# Patient Record
Sex: Male | Born: 1965 | Race: Black or African American | Hispanic: No | Smoking: Current every day smoker
Health system: Southern US, Community
[De-identification: ages and names within clinical notes are randomized; demographics above are authoritative.]

## PROBLEM LIST (undated history)

## (undated) DIAGNOSIS — R011 Cardiac murmur, unspecified: Secondary | ICD-10-CM

## (undated) DIAGNOSIS — T7840XA Allergy, unspecified, initial encounter: Secondary | ICD-10-CM

## (undated) DIAGNOSIS — N400 Enlarged prostate without lower urinary tract symptoms: Secondary | ICD-10-CM

## (undated) DIAGNOSIS — I350 Nonrheumatic aortic (valve) stenosis: Secondary | ICD-10-CM

## (undated) HISTORY — DX: Cardiac murmur, unspecified: R01.1

## (undated) HISTORY — DX: Nonrheumatic aortic (valve) stenosis: I35.0

## (undated) HISTORY — PX: WISDOM TOOTH EXTRACTION: SHX21

## (undated) HISTORY — PX: AMPUTATION FINGER: SHX6594

## (undated) HISTORY — DX: Allergy, unspecified, initial encounter: T78.40XA

---

## 1998-07-19 ENCOUNTER — Ambulatory Visit (HOSPITAL_BASED_OUTPATIENT_CLINIC_OR_DEPARTMENT_OTHER): Admission: RE | Admit: 1998-07-19 | Discharge: 1998-07-19 | Payer: Self-pay | Admitting: Orthopedic Surgery

## 2001-10-15 ENCOUNTER — Emergency Department (HOSPITAL_COMMUNITY): Admission: EM | Admit: 2001-10-15 | Discharge: 2001-10-15 | Payer: Self-pay | Admitting: Emergency Medicine

## 2013-10-27 ENCOUNTER — Telehealth: Payer: Self-pay | Admitting: Family Medicine

## 2013-10-27 NOTE — Telephone Encounter (Signed)
OPEN CHART BY MISTAKE WAS LOOKING FOR ANOTHER PATIENT.

## 2014-04-14 ENCOUNTER — Emergency Department (HOSPITAL_COMMUNITY): Payer: 59

## 2014-04-14 ENCOUNTER — Emergency Department (HOSPITAL_COMMUNITY)
Admission: EM | Admit: 2014-04-14 | Discharge: 2014-04-14 | Disposition: A | Discharge status: Home / Self Care | Payer: 59 | Attending: Emergency Medicine | Admitting: Emergency Medicine

## 2014-04-14 ENCOUNTER — Encounter (HOSPITAL_COMMUNITY): Payer: Self-pay | Admitting: *Deleted

## 2014-04-14 DIAGNOSIS — B349 Viral infection, unspecified: Secondary | ICD-10-CM | POA: Diagnosis not present

## 2014-04-14 DIAGNOSIS — R05 Cough: Secondary | ICD-10-CM | POA: Diagnosis present

## 2014-04-14 DIAGNOSIS — Z87448 Personal history of other diseases of urinary system: Secondary | ICD-10-CM | POA: Insufficient documentation

## 2014-04-14 DIAGNOSIS — Z720 Tobacco use: Secondary | ICD-10-CM | POA: Diagnosis not present

## 2014-04-14 DIAGNOSIS — M791 Myalgia: Secondary | ICD-10-CM | POA: Insufficient documentation

## 2014-04-14 HISTORY — DX: Benign prostatic hyperplasia without lower urinary tract symptoms: N40.0

## 2014-04-14 MED ORDER — IBUPROFEN 800 MG PO TABS
800.0000 mg | ORAL_TABLET | Freq: Once | ORAL | Status: AC
  Administered 2014-04-14: 800 mg via ORAL
  Filled 2014-04-14: qty 1

## 2014-04-14 MED ORDER — HYDROCOD POLST-CHLORPHEN POLST 10-8 MG/5ML PO LQCR
5.0000 mL | Freq: Once | ORAL | Status: AC
  Administered 2014-04-14: 5 mL via ORAL
  Filled 2014-04-14: qty 5

## 2014-04-14 MED ORDER — HYDROCOD POLST-CHLORPHEN POLST 10-8 MG/5ML PO LQCR: 5.0000 mL | Freq: Two times a day (BID) | ORAL | Status: DC

## 2014-04-14 MED ORDER — IBUPROFEN 800 MG PO TABS: 800.0000 mg | ORAL_TABLET | Freq: Three times a day (TID) | ORAL | Status: DC

## 2014-04-14 NOTE — ED Provider Notes (Signed)
CSN: [id number]     Arrival date & time 04/14/14  1119 History   First MD Initiated Contact with Patient 04/14/14 1225     Chief Complaint  Patient presents with  . Cough     (Consider location/radiation/quality/duration/timing/severity/associated sxs/prior Treatment) HPI Comments: Body aches and fever. Generally not feeling well.  Patient is a 49 y.o. male presenting with cough. The history is provided by the patient.  Cough Cough characteristics:  Productive Sputum characteristics:  Manson PasseyBrown Severity:  Moderate Onset quality:  Gradual Duration:  1 day Timing:  Intermittent Progression:  Worsening Chronicity:  New Smoker: no   Context: sick contacts and weather changes   Relieved by:  Nothing Worsened by:  Nothing tried Ineffective treatments:  None tried Associated symptoms: myalgias and sinus congestion   Associated symptoms: no chest pain, no eye discharge, no shortness of breath and no wheezing   Risk factors: no recent travel     Past Medical History  Diagnosis Date  . Enlarged prostate    History reviewed. No pertinent past surgical history. History reviewed. No pertinent family history. History  Substance Use Topics  . Smoking status: Current Every Day Smoker  . Smokeless tobacco: Not on file  . Alcohol Use: Yes    Review of Systems  Constitutional: Negative for activity change.       All ROS Neg except as noted in HPI  HENT: Positive for congestion. Negative for nosebleeds.   Eyes: Negative for photophobia and discharge.  Respiratory: Positive for cough. Negative for shortness of breath and wheezing.   Cardiovascular: Negative for chest pain and palpitations.  Gastrointestinal: Negative for abdominal pain and blood in stool.  Genitourinary: Negative for dysuria, frequency and hematuria.  Musculoskeletal: Positive for myalgias. Negative for back pain, arthralgias and neck pain.  Skin: Negative.   Neurological: Negative for dizziness, seizures and speech  difficulty.  Psychiatric/Behavioral: Negative for hallucinations and confusion.      Allergies  Other and Chocolate  Home Medications   Prior to Admission medications   Medication Sig Start Date End Date Taking? Authorizing Provider  Dextromethorphan HBr (COUGH RELIEF PO) Take 1 tablet by mouth once.   Yes Historical Provider, MD   BP 134/67 mmHg  Pulse 72  Temp(Src) 100.3 F (37.9 C) (Oral)  Resp 22  Ht 6\' 2"  (1.88 m)  Wt 168 lb (76.204 kg)  BMI 21.56 kg/m2  SpO2 100% Physical Exam  Constitutional: He is oriented to person, place, and time. He appears well-developed and well-nourished.  Non-toxic appearance.  HENT:  Head: Normocephalic.  Right Ear: Tympanic membrane and external ear normal.  Left Ear: Tympanic membrane and external ear normal.  Nasal congestion present  Eyes: EOM and lids are normal. Pupils are equal, round, and reactive to light.  Neck: Normal range of motion. Neck supple. Carotid bruit is not present.  Cardiovascular: Normal rate, regular rhythm, normal heart sounds, intact distal pulses and normal pulses.   Pulmonary/Chest: Breath sounds normal. No respiratory distress. He has no rales. He exhibits tenderness.  Abdominal: Soft. Bowel sounds are normal. There is no tenderness. There is no guarding.  Musculoskeletal: Normal range of motion.  Lymphadenopathy:       Head (right side): No submandibular adenopathy present.       Head (left side): No submandibular adenopathy present.    He has no cervical adenopathy.  Neurological: He is alert and oriented to person, place, and time. He has normal strength. No cranial nerve deficit or sensory deficit.  Skin: Skin is warm and dry.  Psychiatric: He has a normal mood and affect. His speech is normal.  Nursing note and vitals reviewed.   ED Course  Procedures (including critical care time) Labs Review Labs Reviewed - No data to display  Imaging Review Dg Chest 2 View  04/14/2014   CLINICAL DATA:   Productive cough for 1 day  EXAM: CHEST  2 VIEW  COMPARISON:  None.  FINDINGS: Lungs are clear. Heart size and pulmonary vascularity are normal. No adenopathy. No bone lesions.  IMPRESSION: No edema or consolidation.   Electronically Signed   By: Bretta Bang III M.D.   On: 04/14/2014 12:41     EKG Interpretation None      MDM  Chest xray is negative for pneumonia. Pulse ox 100% on room air. Pt non-toxic  Plan - Rx for ibuprofen and tussionex. Pt to increase fluids and rest.   Final diagnoses:  None    **I have reviewed nursing notes, vital signs, and all appropriate lab and imaging results for this patient.Kathie Dike, PA-C 04/16/14 1610  Donnetta Hutching, MD 04/23/14 [phone]

## 2014-04-14 NOTE — ED Notes (Signed)
Pt verbalized understanding of no driving and to use caution within 4 hours of taking pain meds due to meds cause drowsiness 

## 2014-04-14 NOTE — ED Notes (Signed)
Cough brown sputum  And blood. . Onset yesterday, body aches.  No NVD  Low back pain,    Headache

## 2014-04-14 NOTE — Discharge Instructions (Signed)
Please wash her hands frequently. Please use ibuprofen 3 times daily with food. May use Tussionex 2 times daily for cough and congestion. This medication may cause drowsiness, please use with caution. Please increase water, juices, Gatorade. Viral Infections A virus is a type of germ. Viruses can cause:  Minor sore throats.  Aches and pains.  Headaches.  Runny nose.  Rashes.  Watery eyes.  Tiredness.  Coughs.  Loss of appetite.  Feeling sick to your stomach (nausea).  Throwing up (vomiting).  Watery poop (diarrhea). HOME CARE   Only take medicines as told by your doctor.  Drink enough water and fluids to keep your pee (urine) clear or pale yellow. Sports drinks are a good choice.  Get plenty of rest and eat healthy. Soups and broths with crackers or rice are fine. GET HELP RIGHT AWAY IF:   You have a very bad headache.  You have shortness of breath.  You have chest pain or neck pain.  You have an unusual rash.  You cannot stop throwing up.  You have watery poop that does not stop.  You cannot keep fluids down.  You or your child has a temperature by mouth above 102 F (38.9 C), not controlled by medicine.  Your baby is older than 3 months with a rectal temperature of 102 F (38.9 C) or higher.  Your baby is 283 months old or younger with a rectal temperature of 100.4 F (38 C) or higher. MAKE SURE YOU:   Understand these instructions.  Will watch this condition.  Will get help right away if you are not doing well or get worse. Document Released: 01/11/2008 Document Revised: 04/22/2011 Document Reviewed: 06/05/2010 [facility] Patient Information 2015 [employer], [place]. This information is not intended to replace advice given to you by your health care provider. Make sure you discuss any questions you have with your health care provider.  Cough, Adult  A cough is a reflex. It helps you clear your throat and airways. A cough can help heal your body. A cough  can last 2 or 3 weeks (acute) or may last more than 8 weeks (chronic). Some common causes of a cough can include an infection, allergy, or a cold. HOME CARE  Only take medicine as told by your doctor.  If given, take your medicines (antibiotics) as told. Finish them even if you start to feel better.  Use a cold steam vaporizer or humidifier in your home. This can help loosen thick spit (secretions).  Sleep so you are almost sitting up (semi-upright). Use pillows to do this. This helps reduce coughing.  Rest as needed.  Stop smoking if you smoke. GET HELP RIGHT AWAY IF:  You have yellowish-white fluid (pus) in your thick spit.  Your cough gets worse.  Your medicine does not reduce coughing, and you are losing sleep.  You cough up blood.  You have trouble breathing.  Your pain gets worse and medicine does not help.  You have a fever. MAKE SURE YOU:   Understand these instructions.  Will watch your condition.  Will get help right away if you are not doing well or get worse. Document Released: 10/11/2010 Document Revised: 06/14/2013 Document Reviewed: 10/11/2010 [facility] Patient Information 2015 [employer], [place]. This information is not intended to replace advice given to you by your health care provider. Make sure you discuss any questions you have with your health care provider.

## 2016-04-09 ENCOUNTER — Emergency Department (HOSPITAL_COMMUNITY): Payer: Self-pay

## 2016-04-09 ENCOUNTER — Emergency Department (HOSPITAL_COMMUNITY)
Admission: EM | Admit: 2016-04-09 | Discharge: 2016-04-09 | Disposition: A | Discharge status: Home / Self Care | Payer: Self-pay | Attending: Emergency Medicine | Admitting: Emergency Medicine

## 2016-04-09 ENCOUNTER — Encounter (HOSPITAL_COMMUNITY): Payer: Self-pay | Admitting: *Deleted

## 2016-04-09 DIAGNOSIS — J111 Influenza due to unidentified influenza virus with other respiratory manifestations: Secondary | ICD-10-CM

## 2016-04-09 DIAGNOSIS — R69 Illness, unspecified: Secondary | ICD-10-CM

## 2016-04-09 DIAGNOSIS — F17200 Nicotine dependence, unspecified, uncomplicated: Secondary | ICD-10-CM | POA: Insufficient documentation

## 2016-04-09 DIAGNOSIS — Z791 Long term (current) use of non-steroidal anti-inflammatories (NSAID): Secondary | ICD-10-CM | POA: Insufficient documentation

## 2016-04-09 DIAGNOSIS — J40 Bronchitis, not specified as acute or chronic: Secondary | ICD-10-CM | POA: Insufficient documentation

## 2016-04-09 MED ORDER — OSELTAMIVIR PHOSPHATE 75 MG PO CAPS: 75.0000 mg | ORAL_CAPSULE | Freq: Two times a day (BID) | ORAL | 0 refills | Status: DC

## 2016-04-09 MED ORDER — IBUPROFEN 400 MG PO TABS
400.0000 mg | ORAL_TABLET | Freq: Once | ORAL | Status: AC
  Administered 2016-04-09: 400 mg via ORAL
  Filled 2016-04-09: qty 1

## 2016-04-09 MED ORDER — DOXYCYCLINE HYCLATE 100 MG PO TABS: 100.0000 mg | ORAL_TABLET | Freq: Two times a day (BID) | ORAL | 0 refills | Status: DC

## 2016-04-09 MED ORDER — ACETAMINOPHEN 325 MG PO TABS
650.0000 mg | ORAL_TABLET | Freq: Once | ORAL | Status: AC
  Administered 2016-04-09: 650 mg via ORAL
  Filled 2016-04-09: qty 2

## 2016-04-09 MED ORDER — BENZONATATE 100 MG PO CAPS: 100.0000 mg | ORAL_CAPSULE | Freq: Three times a day (TID) | ORAL | 0 refills | Status: DC | PRN

## 2016-04-09 NOTE — Discharge Instructions (Signed)
Take over the counter decongestant (such as sudafed), as directed on packaging, for the next week.  Use over the counter normal saline nasal spray, with frequent nose blowing, several times per day for the next 2 weeks. Take over the counter tylenol and ibuprofen, as directed on packaging, as needed for discomfort or fever. Call your regular medical doctor today to schedule a follow up appointment this week.  Return to the Emergency Department immediately if worsening.

## 2016-04-09 NOTE — ED Provider Notes (Signed)
[facility]-EMER[facility]ENCY DEPT Provider Note   CSN: [id number] Arrival date & time: 04/09/16  0945     History   Chief Complaint Chief Complaint  Patient presents with  . Cough    HPI Isaac Diaz is a 51 y.o. male.  HPI Pt was seen at 1035.  Per pt, c/o gradual onset and persistence of constant runny/stuffy nose, sinus congestion, cough, "chills," and generalized body aches/fatigue for the past 2 days.  Denies rash, no CP/SOB, no N/V/D, no abd pain.    Past Medical History:  Diagnosis Date  . Enlarged prostate     There are no active problems to display for this patient.   History reviewed. No pertinent surgical history.     Home Medications    Prior to Admission medications   Medication Sig Start Date End Date Taking? Authorizing Provider  ibuprofen (ADVIL,MOTRIN) 800 MG tablet Take 1 tablet (800 mg total) by mouth 3 (three) times daily. 04/14/14  Yes Ivery Quale, PA-C  Pseudoephedrine-APAP-DM (DAYQUIL PO) Take 2 tablets by mouth every 12 (twelve) hours.   Yes Historical Provider, MD    Family History No family history on file.  Social History Social History  Substance Use Topics  . Smoking status: Current Every Day Smoker  . Smokeless tobacco: Never Used  . Alcohol use Yes     Allergies   Other and Chocolate   Review of Systems Review of Systems ROS: Statement: All systems negative except as marked or noted in the HPI; Constitutional: +generalized body aches/fatigue, chills. ; ; Eyes: Negative for eye pain, redness and discharge. ; ; ENMT: Negative for ear pain, hoarseness, sore throat. +nasal congestion, sinus pressure and rhinorrhea. ; ; Cardiovascular: Negative for chest pain, palpitations, diaphoresis, dyspnea and peripheral edema. ; ; Respiratory: +cough. Negative for wheezing and stridor. ; ; Gastrointestinal: Negative for nausea, vomiting, diarrhea, abdominal pain, blood in stool, hematemesis, jaundice and rectal bleeding. . ; ; Genitourinary: Negative  for dysuria, flank pain and hematuria. ; ; Musculoskeletal: Negative for back pain and neck pain. Negative for swelling and trauma.; ; Skin: Negative for pruritus, rash, abrasions, blisters, bruising and skin lesion.; ; Neuro: Negative for headache, lightheadedness and neck stiffness. Negative for weakness, altered level of consciousness, altered mental status, extremity weakness, paresthesias, involuntary movement, seizure and syncope.       Physical Exam Updated Vital Signs BP 137/75 (BP Location: Left Arm)   Pulse (!) 59   Temp 98.2 F (36.8 C) (Oral)   Resp 16   Ht 6\' 2"  (1.88 m)   Wt 175 lb (79.4 kg)   SpO2 100%   BMI 22.47 kg/m   Physical Exam 1040: Physical examination:  Nursing notes reviewed; Vital signs and O2 SAT reviewed;  Constitutional: Well developed, Well nourished, Well hydrated, In no acute distress; Head:  Normocephalic, atraumatic; Eyes: EOMI, PERRL, No scleral icterus; ENMT: TM's clear bilat. +edemetous nasal turbinates bilat with clear rhinorrhea. Mouth and pharynx without lesions. No tonsillar exudates. No intra-oral edema. No submandibular or sublingual edema. No hoarse voice, no drooling, no stridor. No pain with manipulation of larynx. No trismus. Mouth and pharynx normal, Mucous membranes moist; Neck: Supple, Full range of motion, No lymphadenopathy; Cardiovascular: Regular rate and rhythm, No gallop; Respiratory: Breath sounds clear & equal bilaterally, No wheezes.  Speaking full sentences with ease, Normal respiratory effort/excursion; Chest: Nontender, Movement normal; Abdomen: Soft, Nontender, Nondistended, Normal bowel sounds; Genitourinary: No CVA tenderness; Extremities: Pulses normal, No tenderness, No edema, No calf edema or asymmetry.;  Neuro: AA&Ox3, Major CN grossly intact.  Speech clear. No gross focal motor or sensory deficits in extremities.; Skin: Color normal, Warm, Dry.   ED Treatments / Results  Labs (all labs ordered are listed, but only abnormal  results are displayed)   EKG  EKG Interpretation None       Radiology   Procedures Procedures (including critical care time)  Medications Ordered in ED Medications  ibuprofen (ADVIL,MOTRIN) tablet 400 mg (400 mg Oral Given 04/09/16 1129)  acetaminophen (TYLENOL) tablet 650 mg (650 mg Oral Given 04/09/16 1129)     Initial Impression / Assessment and Plan / ED Course  I have reviewed the triage vital signs and the nursing notes.  Pertinent labs & imaging results that were available during my care of the patient were reviewed by me and considered in my medical decision making (see chart for details).  MDM Reviewed: previous chart, nursing note and vitals Interpretation: x-ray    Dg Chest 2 View Result Date: 04/09/2016 CLINICAL DATA:  51 year old male with history of 2 days of cough and congestion. Production of yellow sputum. EXAM: CHEST  2 VIEW COMPARISON:  Chest x-ray 04/14/2014. FINDINGS: Mild diffuse peribronchial cuffing. Lung volumes are normal. No consolidative airspace disease. No pleural effusions. No pneumothorax. No pulmonary nodule or mass noted. Pulmonary vasculature and the cardiomediastinal silhouette are within normal limits. Atherosclerosis in the thoracic aorta. IMPRESSION: 1. Mild diffuse peribronchial cuffing, concerning for an acute bronchitis. 2. Aortic atherosclerosis. Electronically Signed   By: Trudie Reedaniel  Entrikin M.D.   On: 04/09/2016 11:25    1145:  XR as above. Considering pt is a current smoker; will tx with doxycycline and tamiflu. Dx and testing d/w pt and family.  Questions answered.  Verb understanding, agreeable to d/c home with outpt f/u.    Final Clinical Impressions(s) / ED Diagnoses   Final diagnoses:  None    New Prescriptions New Prescriptions   No medications on file     Samuel JesterKathleen Mozelle Remlinger, DO 04/14/16 1714

## 2016-04-09 NOTE — ED Triage Notes (Signed)
Pt comes in with cough and congestion starting 2 days ago. Pt states his is coughing up yellow sputum. NAD noted.

## 2016-05-17 ENCOUNTER — Emergency Department (HOSPITAL_COMMUNITY)
Admission: EM | Admit: 2016-05-17 | Discharge: 2016-05-17 | Disposition: A | Discharge status: Home / Self Care | Payer: Self-pay | Attending: Emergency Medicine | Admitting: Emergency Medicine

## 2016-05-17 ENCOUNTER — Encounter (HOSPITAL_COMMUNITY): Payer: Self-pay

## 2016-05-17 DIAGNOSIS — F17210 Nicotine dependence, cigarettes, uncomplicated: Secondary | ICD-10-CM | POA: Insufficient documentation

## 2016-05-17 DIAGNOSIS — S161XXA Strain of muscle, fascia and tendon at neck level, initial encounter: Secondary | ICD-10-CM | POA: Insufficient documentation

## 2016-05-17 DIAGNOSIS — Y990 Civilian activity done for income or pay: Secondary | ICD-10-CM | POA: Insufficient documentation

## 2016-05-17 DIAGNOSIS — F1290 Cannabis use, unspecified, uncomplicated: Secondary | ICD-10-CM | POA: Insufficient documentation

## 2016-05-17 DIAGNOSIS — Y9389 Activity, other specified: Secondary | ICD-10-CM | POA: Insufficient documentation

## 2016-05-17 DIAGNOSIS — X500XXA Overexertion from strenuous movement or load, initial encounter: Secondary | ICD-10-CM | POA: Insufficient documentation

## 2016-05-17 DIAGNOSIS — Z79899 Other long term (current) drug therapy: Secondary | ICD-10-CM | POA: Insufficient documentation

## 2016-05-17 DIAGNOSIS — Y929 Unspecified place or not applicable: Secondary | ICD-10-CM | POA: Insufficient documentation

## 2016-05-17 MED ORDER — CYCLOBENZAPRINE HCL 10 MG PO TABS: 10.0000 mg | ORAL_TABLET | Freq: Three times a day (TID) | ORAL | 0 refills | Status: DC | PRN

## 2016-05-17 MED ORDER — IBUPROFEN 800 MG PO TABS: 800.0000 mg | ORAL_TABLET | Freq: Three times a day (TID) | ORAL | 0 refills | Status: DC

## 2016-05-17 NOTE — Discharge Instructions (Signed)
Alternate ice and heat to neck and back. Follow-up with primary doctor or return to the ER for any worsening symptoms. Avoid excessive use or heavy lifting with the left arm for 1 week.

## 2016-05-17 NOTE — ED Triage Notes (Addendum)
Pt reports he was at work yesterday carrying a piece of rebarb along with 2 other people. rebarb weighed approx 500 lbs. One person dropped there end casing jerk like motion when fell to ground causing injury to  neck and radiating into left shoulder

## 2016-05-17 NOTE — ED Provider Notes (Signed)
[facility] Provider Note   CSN: [id number] Arrival date & time: 05/17/16  1406     History   Chief Complaint Chief Complaint  Patient presents with  . Neck Pain    HPI JADARIOUS DOBBINS is a 51 y.o. male.  HPI   ELIYOHU CLASS is a 51 y.o. male who presents to the Emergency Department complaining of Pain to the left neck and shoulder that began 1 day prior to arrival. He states that he was helping to other people move proximally 500 pounds of heavy metal when another person dropped their portion of the weight causing him to "jerk" his shoulder and neck. He complains of worsening pain this morning upon waking. Pain is associated with movement of his neck and left arm. Pain improves at rest. He reports taking a friend's "pain by mouth" this morning which provided some relief. He denies fall, numbness or weakness of the upper extremities, headaches, dizziness, visual changes, chest pain  Past Medical History:  Diagnosis Date  . Enlarged prostate     There are no active problems to display for this patient.   History reviewed. No pertinent surgical history.     Home Medications    Prior to Admission medications   Medication Sig Start Date End Date Taking? Authorizing Provider  benzonatate (TESSALON) 100 MG capsule Take 1 capsule (100 mg total) by mouth 3 (three) times daily as needed for cough. 04/09/16   Samuel Jester, DO  doxycycline (VIBRA-TABS) 100 MG tablet Take 1 tablet (100 mg total) by mouth 2 (two) times daily. 04/09/16   Samuel Jester, DO  ibuprofen (ADVIL,MOTRIN) 800 MG tablet Take 1 tablet (800 mg total) by mouth 3 (three) times daily. 04/14/14   Ivery Quale, PA-C  oseltamivir (TAMIFLU) 75 MG capsule Take 1 capsule (75 mg total) by mouth every 12 (twelve) hours. 04/09/16   Samuel Jester, DO  Pseudoephedrine-APAP-DM (DAYQUIL PO) Take 2 tablets by mouth every 12 (twelve) hours.    Historical Provider, MD    Family History No family history on  file.  Social History Social History  Substance Use Topics  . Smoking status: Current Every Day Smoker    Packs/day: 1.00    Types: Cigarettes  . Smokeless tobacco: Never Used  . Alcohol use Yes     Comment:  weekly     Allergies   Other and Chocolate   Review of Systems Review of Systems  Constitutional: Negative for chills and fever.  Gastrointestinal: Negative for nausea and vomiting.  Musculoskeletal: Positive for arthralgias (Left shoulder pain) and neck pain. Negative for joint swelling.  Skin: Negative for color change and wound.  Neurological: Negative for dizziness, facial asymmetry, weakness, numbness and headaches.  All other systems reviewed and are negative.    Physical Exam Updated Vital Signs BP (!) 141/75 (BP Location: Right Arm)   Pulse 71   Temp 98.3 F (36.8 C) (Temporal)   Resp 15   Ht  (1.88 m)   Wt 79.4 kg   SpO2 100%   BMI 22.47 kg/m   Physical Exam  Constitutional: He is oriented to person, place, and time. He appears well-nourished. No distress.  HENT:  Head: Normocephalic and atraumatic.  Mouth/Throat: Oropharynx is clear and moist.  Eyes: EOM are normal. Pupils are equal, round, and reactive to light.  Neck: Trachea normal, normal range of motion and phonation normal. Muscular tenderness present. No spinous process tenderness present. No neck rigidity. Normal range of motion present.  Cardiovascular: Normal rate, regular rhythm and intact distal pulses.   No murmur heard. Pulmonary/Chest: Effort normal and breath sounds normal. No respiratory distress. He exhibits no tenderness.  Musculoskeletal: Normal range of motion. He exhibits tenderness. He exhibits no edema.       Cervical back: He exhibits tenderness and spasm. He exhibits no bony tenderness.       Back:  Focal tenderness along the left SCM and trapezius muscles.  Grip strength is strong and symmetrical bilaterally  Neurological: He is alert and oriented to person,  place, and time. He has normal strength. No sensory deficit. GCS eye subscore is 4. GCS verbal subscore is 5. GCS motor subscore is 6.  CN II-XII grossly intact  Skin: Skin is warm. Capillary refill takes less than 2 seconds. No rash noted.  Nursing note and vitals reviewed.    ED Treatments / Results  Labs (all labs ordered are listed, but only abnormal results are displayed) Labs Reviewed - No data to display  EKG  EKG Interpretation None       Radiology No results found.  Procedures Procedures (including critical care time)  Medications Ordered in ED Medications - No data to display   Initial Impression / Assessment and Plan / ED Course  I have reviewed the triage vital signs and the nursing notes.  Pertinent labs & imaging results that were available during my care of the patient were reviewed by me and considered in my medical decision making (see chart for details).     Patient well-appearing. Vital signs are stable. He has focal tenderness along the muscles of the left neck and upper back.  Pain is likely musculoskeletal. No focal neurological deficits. Neurovascularly intact. Injury did not include a fall, I do not feel that imaging is indicated at this time. Patient agrees to symptomatic treatment with muscle relaxer and NSAID. Advised to apply ice. Return precautions discussed  Final Clinical Impressions(s) / ED Diagnoses   Final diagnoses:  Acute strain of neck muscle, initial encounter    New Prescriptions New Prescriptions   No medications on file     Pauline Aus, PA-C 05/17/16 1520    Eber Hong, MD 05/18/16 [phone]

## 2016-05-21 ENCOUNTER — Emergency Department (HOSPITAL_COMMUNITY)
Admission: EM | Admit: 2016-05-21 | Discharge: 2016-05-21 | Disposition: A | Discharge status: Home / Self Care | Payer: Self-pay | Attending: Emergency Medicine | Admitting: Emergency Medicine

## 2016-05-21 ENCOUNTER — Encounter (HOSPITAL_COMMUNITY): Payer: Self-pay | Admitting: Emergency Medicine

## 2016-05-21 DIAGNOSIS — S46812S Strain of other muscles, fascia and tendons at shoulder and upper arm level, left arm, sequela: Secondary | ICD-10-CM | POA: Insufficient documentation

## 2016-05-21 DIAGNOSIS — Z791 Long term (current) use of non-steroidal anti-inflammatories (NSAID): Secondary | ICD-10-CM | POA: Insufficient documentation

## 2016-05-21 DIAGNOSIS — X500XXS Overexertion from strenuous movement or load, sequela: Secondary | ICD-10-CM | POA: Insufficient documentation

## 2016-05-21 DIAGNOSIS — F17210 Nicotine dependence, cigarettes, uncomplicated: Secondary | ICD-10-CM | POA: Insufficient documentation

## 2016-05-21 DIAGNOSIS — Z79899 Other long term (current) drug therapy: Secondary | ICD-10-CM | POA: Insufficient documentation

## 2016-05-21 NOTE — ED Triage Notes (Signed)
Patient states he is here to be cleared to go back to work and needs a note. No other complaints.

## 2016-05-21 NOTE — ED Provider Notes (Signed)
[facility] DEPT Provider Note   CSN: [id number] Arrival date & time: 05/21/16  1907     History   Chief Complaint Chief Complaint  Patient presents with  . re eval    HPI Isaac Diaz is a 51 y.o. male.  Patient is a 51 year old male who presents to the emergency department for recheck of his neck and shoulder area for return to work.  The patient states that on April 6 he was helping to carry a heavy piece of equipment at his job. Someone drop there in and it caused a jerking sensation to the left shoulder and neck area. He was evaluated in the emergency department. He was treated with muscle relaxers and anti-inflammatory pain medication. The patient states he has been on light duty, and been able to fulfill the requirements of his job on light duty. He states that he occasionally has minor soreness but nothing that MP symptoms from doing his activities of daily living, or his work. He has not had any difficulty with movement of the neck, shoulder, or left upper extremity. He's not dropping any objects, or having any other issues to be reported.   The history is provided by the patient.    Past Medical History:  Diagnosis Date  . Enlarged prostate     There are no active problems to display for this patient.   History reviewed. No pertinent surgical history.     Home Medications    Prior to Admission medications   Medication Sig Start Date End Date Taking? Authorizing Provider  benzonatate (TESSALON) 100 MG capsule Take 1 capsule (100 mg total) by mouth 3 (three) times daily as needed for cough. 04/09/16   Samuel Jester, DO  cyclobenzaprine (FLEXERIL) 10 MG tablet Take 1 tablet (10 mg total) by mouth 3 (three) times daily as needed. 05/17/16   Tammy Triplett, PA-C  doxycycline (VIBRA-TABS) 100 MG tablet Take 1 tablet (100 mg total) by mouth 2 (two) times daily. 04/09/16   Samuel Jester, DO  ibuprofen (ADVIL,MOTRIN) 800 MG tablet Take 1 tablet (800 mg total)  by mouth 3 (three) times daily. 05/17/16   Tammy Triplett, PA-C  oseltamivir (TAMIFLU) 75 MG capsule Take 1 capsule (75 mg total) by mouth every 12 (twelve) hours. 04/09/16   Samuel Jester, DO  Pseudoephedrine-APAP-DM (DAYQUIL PO) Take 2 tablets by mouth every 12 (twelve) hours.    Historical Provider, MD    Family History No family history on file.  Social History Social History  Substance Use Topics  . Smoking status: Current Every Day Smoker    Packs/day: 1.00    Types: Cigarettes  . Smokeless tobacco: Never Used  . Alcohol use Yes     Comment:  weekly     Allergies   Other and Chocolate   Review of Systems Review of Systems  Constitutional: Negative for activity change.       All ROS Neg except as noted in HPI  HENT: Negative for nosebleeds.   Eyes: Negative for photophobia and discharge.  Respiratory: Negative for cough, shortness of breath and wheezing.   Cardiovascular: Negative for chest pain and palpitations.  Gastrointestinal: Negative for abdominal pain and blood in stool.  Genitourinary: Negative for dysuria, frequency and hematuria.  Musculoskeletal: Negative for arthralgias, back pain and neck pain.  Skin: Negative.   Neurological: Negative for dizziness, seizures and speech difficulty.  Psychiatric/Behavioral: Negative for confusion and hallucinations.     Physical Exam Updated Vital Signs BP 130/76   Pulse  74   Temp 98.2 F (36.8 C)   Resp 18   Ht  (1.88 m)   Wt 79.4 kg   SpO2 99%   BMI 22.47 kg/m   Physical Exam  Constitutional: He is oriented to person, place, and time. He appears well-developed and well-nourished.  Non-toxic appearance.  HENT:  Head: Normocephalic.  Right Ear: Tympanic membrane and external ear normal.  Left Ear: Tympanic membrane and external ear normal.  Eyes: EOM and lids are normal. Pupils are equal, round, and reactive to light.  Neck: Normal range of motion. Neck supple. Carotid bruit is not present.    Cardiovascular: Normal rate, regular rhythm, normal heart sounds, intact distal pulses and normal pulses.   Pulmonary/Chest: Breath sounds normal. No respiratory distress.  Abdominal: Soft. Bowel sounds are normal. There is no tenderness. There is no guarding.  Musculoskeletal: Normal range of motion.  There is no palpable step off of the cervical, thoracic, or lumbar spine. There is full range of motion of the neck. There is no paraspinal tenderness at the cervical area. There is no dislocation of the shoulder or the scapula. There is no swelling or hematoma involving the shoulder. Radial pulses 2+. The capillary refill is less than 2 seconds.  Lymphadenopathy:       Head (right side): No submandibular adenopathy present.       Head (left side): No submandibular adenopathy present.    He has no cervical adenopathy.  Neurological: He is alert and oriented to person, place, and time. He has normal strength. No cranial nerve deficit or sensory deficit.  There no motor or sensory deficits appreciated of the right or left upper extremity. Patient is able to push against resistance without problem. This no atrophy appreciated of the right or left upper extremity.  Skin: Skin is warm and dry.  Psychiatric: He has a normal mood and affect. His speech is normal.  Nursing note and vitals reviewed.    ED Treatments / Results  Labs (all labs ordered are listed, but only abnormal results are displayed) Labs Reviewed - No data to display  EKG  EKG Interpretation None       Radiology No results found.  Procedures Procedures (including critical care time)  Medications Ordered in ED Medications - No data to display   Initial Impression / Assessment and Plan / ED Course  I have reviewed the triage vital signs and the nursing notes.  Pertinent labs & imaging results that were available during my care of the patient were reviewed by me and considered in my medical decision making (see chart  for details).     *I have reviewed nursing notes, vital signs, and all appropriate lab and imaging results for this patient.**  Final Clinical Impressions(s) / ED Diagnoses MDM Patient sustained a cervical strain and strain/sprain of the left shoulder about 4 days ago. Patient was seen in the emergency department. He was treated with anti-inflammatory pain medication and muscle relaxers. The patient states that he has good range of motion of his neck and shoulder areas now. He has very minimal pain, and he feels he is able to return to work duty. No gross neurologic deficit appreciated on examination at this time. No deformity or other issue on musculoskeletal examination. Patient is given a note to return to work on April 11. The patient is advised to see the orthopedic specialist for additional evaluation if the problem should recur. Patient is in agreement with this plan.  Final diagnoses:  None    New Prescriptions New Prescriptions   No medications on file     Ivery Quale, PA-C 05/21/16 2016    Maia Plan, MD 05/22/16 1029

## 2016-05-21 NOTE — Discharge Instructions (Signed)
Your vital signs are within normal limits. Your nerve and neurologic function are well within normal limits. Musculoskeletal limitations appreciated on examination at this time. Please see Dr. Romeo Apple for orthopedic evaluation and management if your symptoms should return.

## 2016-06-21 ENCOUNTER — Encounter: Payer: Self-pay | Admitting: Family Medicine

## 2016-06-21 ENCOUNTER — Ambulatory Visit (INDEPENDENT_AMBULATORY_CARE_PROVIDER_SITE_OTHER): Payer: 59 | Admitting: Family Medicine

## 2016-06-21 ENCOUNTER — Ambulatory Visit (HOSPITAL_COMMUNITY)
Admission: RE | Admit: 2016-06-21 | Discharge: 2016-06-21 | Disposition: A | Discharge status: Home / Self Care | Payer: 59 | Source: Ambulatory Visit | Attending: Family Medicine | Admitting: Family Medicine

## 2016-06-21 VITALS — BP 124/66 | HR 68 | Temp 97.5°F | Resp 16 | Ht 74.0 in | Wt 181.1 lb

## 2016-06-21 DIAGNOSIS — Z720 Tobacco use: Secondary | ICD-10-CM

## 2016-06-21 DIAGNOSIS — N401 Enlarged prostate with lower urinary tract symptoms: Secondary | ICD-10-CM | POA: Insufficient documentation

## 2016-06-21 DIAGNOSIS — N138 Other obstructive and reflux uropathy: Secondary | ICD-10-CM | POA: Insufficient documentation

## 2016-06-21 DIAGNOSIS — R5383 Other fatigue: Secondary | ICD-10-CM | POA: Insufficient documentation

## 2016-06-21 DIAGNOSIS — R0600 Dyspnea, unspecified: Secondary | ICD-10-CM | POA: Insufficient documentation

## 2016-06-21 DIAGNOSIS — Z7689 Persons encountering health services in other specified circumstances: Secondary | ICD-10-CM | POA: Diagnosis not present

## 2016-06-21 DIAGNOSIS — I700 Atherosclerosis of aorta: Secondary | ICD-10-CM

## 2016-06-21 LAB — COMPLETE METABOLIC PANEL WITH GFR
ALT: 8 U/L — ABNORMAL LOW (ref 9–46)
AST: 14 U/L (ref 10–35)
Albumin: 4 g/dL (ref 3.6–5.1)
Alkaline Phosphatase: 56 U/L (ref 40–115)
BUN: 13 mg/dL (ref 7–25)
CHLORIDE: 109 mmol/L (ref 98–110)
CO2: 23 mmol/L (ref 20–31)
Calcium: 9.4 mg/dL (ref 8.6–10.3)
Creat: 1.11 mg/dL (ref 0.70–1.33)
GFR, Est African American: 88 mL/min (ref >=60)
GFR, Est Non African American: 76 mL/min (ref >=60)
GLUCOSE: 66 mg/dL (ref 65–99)
POTASSIUM: 4.5 mmol/L (ref 3.5–5.3)
Sodium: 140 mmol/L (ref 135–146)
Total Bilirubin: 0.6 mg/dL (ref 0.2–1.2)
Total Protein: 6.6 g/dL (ref 6.1–8.1)

## 2016-06-21 LAB — CBC
HCT: 41.5 % (ref 38.5–50.0)
Hemoglobin: 13.8 g/dL (ref 13.2–17.1)
MCH: 31.4 pg (ref 27.0–33.0)
MCHC: 33.3 g/dL (ref 32.0–36.0)
MCV: 94.5 fL (ref 80.0–100.0)
MPV: 12.8 fL — ABNORMAL HIGH (ref 7.5–12.5)
Platelets: 189 10*3/uL (ref 140–400)
RBC: 4.39 MIL/uL (ref 4.20–5.80)
RDW: 13.8 % (ref 11.0–15.0)
WBC: 5 10*3/uL (ref 3.8–10.8)

## 2016-06-21 LAB — LIPID PANEL
CHOL/HDL RATIO: 3.9 ratio (ref <5.0)
Cholesterol: 147 mg/dL (ref <200)
HDL: 38 mg/dL — AB (ref >40)
LDL CALC: 81 mg/dL (ref <100)
TRIGLYCERIDES: 138 mg/dL (ref <150)
VLDL: 28 mg/dL (ref <30)

## 2016-06-21 LAB — TSH: TSH: 0.37 mIU/L — ABNORMAL LOW (ref 0.40–4.50)

## 2016-06-21 NOTE — Progress Notes (Signed)
Chief Complaint  Patient presents with  . Establish Care   New to establish No PCP for many years No old records available Says tetanus and flu shots up to date No colon cancer screening Does not feel need for HIV or Hep C screening - offered and discussed  He had BPH diagnosis in his 49s.  It has bothered him intermittently over years, the last few being more severe and constant.  Trouble starting stream.  Frequency.  inability to empty bladder fully.  NO hematuria.  Sometimes urine is "dark"  I have discussed the multiple health risks associated with cigarette smoking including, but not limited to, cardiovascular disease, lung disease and cancer.  I have strongly recommended that smoking be stopped.  I have reviewed the various methods of quitting including cold [place], classes, nicotine replacements and prescription medications.  I have offered assistance in this difficult process.  The patient is not interested in assistance at this time.  He has a history of heart murmer.  No diagnostic testing.  No chest pain or heart symptoms.    More dyspnea lately.  More tired.  "saw stars" a couple of times with exertion or standing.    Patient Active Problem List   Diagnosis Date Noted  . 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 Tobacco abuse 06/21/2016  . Dyspnea 06/21/2016  . Fatigue 06/21/2016  . Aortic atherosclerosis (HCC) 06/21/2016    Outpatient Encounter Prescriptions as of 06/21/2016  Medication Sig  NONE No facility-administered encounter medications on file as of 06/21/2016.     Past Medical History:  Diagnosis Date  . Allergy   . Enlarged prostate   . Heart murmur     Past Surgical History:  Procedure Laterality Date  . AMPUTATION FINGER Right    fingertip right long    Social History   Social History  . Marital status: Legally Separated    Spouse name: N/A  . Number of children: 6  . Years of education: 94   Occupational History   . concrete [employer]    Social History Main Topics  . Smoking status: Current Every Day Smoker    Packs/day: 1.00    Years: 28.00    Types: Cigarettes    Start date: 02/12/1988  . Smokeless tobacco: Never Used  . Alcohol use Yes     Comment:  weekly  . Drug use: Yes    Types: Marijuana  . Sexual activity: Yes    Birth control/ protection: None   Other Topics Concern  . Not on file   Social History Narrative   Born in [place]   Worked in [place] for 17 years   Moved back Dec 2018   Lives with Sister, [employer]   Family nearby       Family History  Problem Relation Age of Onset  . Cancer Mother        stomach  . Stroke Father 13  . Aneurysm Sister        brain aneurysm  . Cancer Maternal Grandfather        lung    Review of Systems  Constitutional: Positive for malaise/fatigue. Negative for chills and fever.  HENT: Negative for congestion and hearing loss.   Eyes: Negative for blurred vision and pain.  Respiratory: Positive for shortness of breath. Negative for cough, sputum production and wheezing.   Cardiovascular: Negative for chest pain and leg swelling.  Gastrointestinal: Negative for abdominal pain, constipation, diarrhea and heartburn.  Genitourinary: Positive for frequency and urgency. Negative for dysuria, flank pain and hematuria.  Musculoskeletal: Negative for falls, joint pain and myalgias.  Neurological: Positive for dizziness. Negative for seizures and headaches.  Endo/Heme/Allergies: Negative for environmental allergies and polydipsia. Does not bruise/bleed easily.  Psychiatric/Behavioral: Negative for depression. The patient is not nervous/anxious and does not have insomnia.     BP 124/66 (BP Location: Right Arm, Patient Position: Sitting, Cuff Size: Large)   Pulse 68   Temp 97.5 F (36.4 C) (Temporal)   Resp 16   Ht 6\' 2"  (1.88 m)   Wt 181 lb 1.3 oz (82.1 kg)   SpO2 98%   BMI 23.25 kg/m   Physical Exam  Constitutional: He is  oriented to person, place, and time. He appears well-developed and well-nourished.  HENT:  Head: Normocephalic and atraumatic.  Right Ear: External ear normal.  Left Ear: External ear normal.  Mouth/Throat: Oropharynx is clear and moist.  Dental fractures  Eyes: Conjunctivae are normal. Pupils are equal, round, and reactive to light.  Neck: Normal range of motion. Neck supple. No thyromegaly present.  Cardiovascular: Normal rate and regular rhythm.   Prominent systolic murmur across precorsium  Pulmonary/Chest: Effort normal and breath sounds normal. No respiratory distress.  Abdominal: Soft. Bowel sounds are normal.  No organmegaly  Musculoskeletal: Normal range of motion. He exhibits no edema.  Lymphadenopathy:    He has no cervical adenopathy.  Neurological: He is alert and oriented to person, place, and time. He displays normal reflexes.  Gait normal  Skin: Skin is warm and dry.  Psychiatric: He has a normal mood and affect. His behavior is normal. Thought content normal.  Nursing note and vitals reviewed.  ASSESSMENT/PLAN:  1. BPH with obstruction/lower urinary tract symptoms - PSA - Ambulatory referral to Urology  2. Tobacco abuse  3. Dyspnea, unspecified type - DG Chest 2 View; Future  4. Fatigue, unspecified type - CBC - COMPLETE METABOLIC PANEL WITH GFR - Hemoglobin A1c - Lipid panel - VITAMIN D 25 Hydroxy (Vit-D Deficiency, Fractures) - TSH - Urinalysis, Routine w reflex microscopic  5. Encounter to establish care with new doctor  6. Aortic atherosclerosis (HCC) On x ray  6. Heart murmur - ECHOCARDIOGRAM COMPLETE; Future  Patient Instructions  Lab tests today Chest x ray today Drink more WATER Echocardiogram is ordered to check your heart Urology referral is made to check your prostate See me in a couple of weeks after testing    Eustace MooreYvonne Sue Britanni Yarde, MD

## 2016-06-21 NOTE — Patient Instructions (Signed)
Lab tests today Chest x ray today Drink more WATER Echocardiogram is ordered to check your heart Urology referral is made to check your prostate See me in a couple of weeks after testing

## 2016-06-22 LAB — URINALYSIS, ROUTINE W REFLEX MICROSCOPIC
BILIRUBIN URINE: NEGATIVE
GLUCOSE, UA: NEGATIVE
HGB URINE DIPSTICK: NEGATIVE
Ketones, ur: NEGATIVE
Leukocytes, UA: NEGATIVE
Nitrite: NEGATIVE
PROTEIN: NEGATIVE
Specific Gravity, Urine: 1.024 (ref 1.001–1.035)
pH: 6.5 (ref 5.0–8.0)

## 2016-06-22 LAB — PSA: PSA: 0.4 ng/mL (ref <=4.0)

## 2016-06-22 LAB — HEMOGLOBIN A1C
Hgb A1c MFr Bld: 5.4 % (ref <5.7)
Mean Plasma Glucose: 108 mg/dL

## 2016-06-22 LAB — VITAMIN D 25 HYDROXY (VIT D DEFICIENCY, FRACTURES): Vit D, 25-Hydroxy: 8 ng/mL — ABNORMAL LOW (ref 30–100)

## 2016-06-24 ENCOUNTER — Encounter: Payer: Self-pay | Admitting: Family Medicine

## 2016-06-24 MED ORDER — VITAMIN D (ERGOCALCIFEROL) 1.25 MG (50000 UNIT) PO CAPS
50000.0000 [IU] | ORAL_CAPSULE | ORAL | 0 refills | Status: DC
Start: 2016-06-24 — End: 2016-07-25

## 2016-06-28 ENCOUNTER — Ambulatory Visit (HOSPITAL_COMMUNITY)
Admission: RE | Admit: 2016-06-28 | Discharge: 2016-06-28 | Disposition: A | Discharge status: Home / Self Care | Payer: 59 | Source: Ambulatory Visit | Attending: Family Medicine | Admitting: Family Medicine

## 2016-06-28 DIAGNOSIS — Z7689 Persons encountering health services in other specified circumstances: Secondary | ICD-10-CM

## 2016-06-28 DIAGNOSIS — R5383 Other fatigue: Secondary | ICD-10-CM

## 2016-06-28 DIAGNOSIS — I340 Nonrheumatic mitral (valve) insufficiency: Secondary | ICD-10-CM | POA: Insufficient documentation

## 2016-06-28 DIAGNOSIS — Q231 Congenital insufficiency of aortic valve: Secondary | ICD-10-CM | POA: Diagnosis not present

## 2016-06-28 DIAGNOSIS — N138 Other obstructive and reflux uropathy: Secondary | ICD-10-CM | POA: Diagnosis not present

## 2016-06-28 DIAGNOSIS — R0600 Dyspnea, unspecified: Secondary | ICD-10-CM | POA: Diagnosis not present

## 2016-06-28 DIAGNOSIS — Z720 Tobacco use: Secondary | ICD-10-CM

## 2016-06-28 DIAGNOSIS — N401 Enlarged prostate with lower urinary tract symptoms: Secondary | ICD-10-CM | POA: Diagnosis not present

## 2016-06-28 LAB — ECHOCARDIOGRAM COMPLETE
AO mean calculated velocity dopler: 258 cm/s
AOPV: 0.41 m/s
AOVTI: 96.1 cm
AV Area mean vel: 1.33 cm2
AV Mean grad: 32 mmHg
AV Peak grad: 60 mmHg
AV VEL mean LVOT/AV: 0.42
AV pk vel: 388 cm/s
AV vel: 1.46
AVAREAMEANVIN: 0.64 cm2/m2
AVAREAVTI: 1.28 cm2
AVAREAVTIIND: 0.7 cm2/m2
AVPHT: 610 ms
CHL CUP AV PEAK INDEX: 0.62
CHL CUP MV DEC (S): 254
CHL CUP STROKE VOLUME: 126 mL
CHL CUP TV REG PEAK VELOCITY: 266 cm/s
E decel time: 254 msec
E/e' ratio: 7.79
FS: 38 % (ref 28–44)
IVS/LV PW RATIO, ED: 0.93
LA ID, A-P, ES: 38 mm
LA diam end sys: 38 mm
LA diam index: 1.83 cm/m2
LA vol A4C: 78.4 ml
LA vol index: 45.4 mL/m2
LA vol: 94.4 mL
LV E/e' medial: 7.79
LV PW d: 10.9 mm — AB (ref 0.6–1.1)
LV TDI E'MEDIAL: 10
LV e' LATERAL: 14 cm/s
LV sys vol: 53 mL
LVDIAVOL: 179 mL — AB (ref 62–150)
LVDIAVOLIN: 86 mL/m2
LVEEAVG: 7.79
LVOT VTI: 44.7 cm
LVOT area: 3.14 cm2
LVOT diameter: 20 mm
LVOT peak grad rest: 10 mmHg
LVOT peak vel: 158 cm/s
LVOTSV: 140 mL
LVOTVTI: 0.47 cm
LVSYSVOLIN: 26 mL/m2
Lateral S' vel: 13.3 cm/s
MV Peak grad: 5 mmHg
MV pk E vel: 109 m/s
MVPKAVEL: 44.1 m/s
RV TAPSE: 24.8 mm
RV sys press: 36 mmHg
Simpson's disk: 70
TDI e' lateral: 14
TR max vel: 266 cm/s
Valve area index: 0.7
Valve area: 1.46 cm2

## 2016-06-28 NOTE — Progress Notes (Signed)
*  PRELIMINARY RESULTS* Echocardiogram 2D Echocardiogram has been performed.  Isaac Diaz, Isaac Diaz 06/28/2016, 3:57 PM

## 2016-07-01 ENCOUNTER — Encounter: Payer: Self-pay | Admitting: Family Medicine

## 2016-07-01 DIAGNOSIS — Q230 Congenital stenosis of aortic valve: Secondary | ICD-10-CM

## 2016-07-01 DIAGNOSIS — Q231 Congenital insufficiency of aortic valve: Principal | ICD-10-CM

## 2016-07-10 ENCOUNTER — Telehealth: Payer: Self-pay | Admitting: Family Medicine

## 2016-07-10 NOTE — Telephone Encounter (Signed)
Patient is requesting dr.nelson to call him and go over his test results with him, he doesn't understand  Cb#: [phone]

## 2016-07-11 NOTE — Telephone Encounter (Signed)
Left message to call back  

## 2016-07-12 NOTE — Telephone Encounter (Signed)
Pt left message on nurse line 07/11/16 4:14pm. No detail.  Returning call.

## 2016-07-16 NOTE — Telephone Encounter (Signed)
Left message for Isaac Diaz to call office

## 2016-07-25 ENCOUNTER — Ambulatory Visit (INDEPENDENT_AMBULATORY_CARE_PROVIDER_SITE_OTHER): Payer: 59 | Admitting: Cardiology

## 2016-07-25 VITALS — BP 132/72 | HR 67 | Ht 74.5 in | Wt 179.0 lb

## 2016-07-25 DIAGNOSIS — I350 Nonrheumatic aortic (valve) stenosis: Secondary | ICD-10-CM | POA: Diagnosis not present

## 2016-07-25 DIAGNOSIS — Z8774 Personal history of (corrected) congenital malformations of heart and circulatory system: Secondary | ICD-10-CM | POA: Diagnosis not present

## 2016-07-25 DIAGNOSIS — I10 Essential (primary) hypertension: Secondary | ICD-10-CM | POA: Diagnosis not present

## 2016-07-25 NOTE — Progress Notes (Signed)
Clinical Summary Mr. Jim LikeMillner is a 51 y.o.male seen today as a new patient, he is referred by Dr Delton SeeNelson for heart mumrur   1. Aortic stenosis - echo 06/2016 LVEF 60-65%, normal diastolic function, moderate AS mean grad 32, AVA VTI 1.46, mod AI, mild MR - probable bicuspid AV - no SOB or DOE, + generalized  Fatigue, can feel weak all over     - SH: son with Tetrology of fallot with prior surgery   Past Medical History:  Diagnosis Date  . Allergy   . Enlarged prostate   . Heart murmur      Allergies  Allergen Reactions  . Other Anaphylaxis    Feathers (pillow stuffing)  . Chocolate Hives     Current Outpatient Prescriptions  Medication Sig Dispense Refill  . Vitamin D, Ergocalciferol, (DRISDOL) 50000 units CAPS capsule Take 1 capsule (50,000 Units total) by mouth every 7 (seven) days. 12 capsule 0   No current facility-administered medications for this visit.      Past Surgical History:  Procedure Laterality Date  . AMPUTATION FINGER Right    fingertip right long     Allergies  Allergen Reactions  . Other Anaphylaxis    Feathers (pillow stuffing)  . Chocolate Hives      Family History  Problem Relation Age of Onset  . Cancer Mother        stomach  . Stroke Father 53  . Aneurysm Sister        brain aneurysm  . Cancer Maternal Grandfather        lung     Social History Mr. Jim LikeMillner reports that he has been smoking Cigarettes.  He started smoking about 28 years ago. He has a 28.00 pack-year smoking history. He has never used smokeless tobacco. Mr. Jim LikeMillner reports that he drinks alcohol.   Review of Systems CONSTITUTIONAL: +fatigue HEENT: Eyes: No visual loss, blurred vision, double vision or yellow sclerae.No hearing loss, sneezing, congestion, runny nose or sore throat.  SKIN: No rash or itching.  CARDIOVASCULAR: per HPI RESPIRATORY: No shortness of breath, cough or sputum.  GASTROINTESTINAL: No anorexia, nausea, vomiting or diarrhea. No  abdominal pain or blood.  GENITOURINARY: No burning on urination, no polyuria NEUROLOGICAL: No headache, dizziness, syncope, paralysis, ataxia, numbness or tingling in the extremities. No change in bowel or bladder control.  MUSCULOSKELETAL: No muscle, back pain, joint pain or stiffness.  LYMPHATICS: No enlarged nodes. No history of splenectomy.  PSYCHIATRIC: No history of depression or anxiety.  ENDOCRINOLOGIC: No reports of sweating, cold or heat intolerance. No polyuria or polydipsia.  Marland Kitchen.   Physical Examination Vitals:   07/25/16 1342 07/25/16 1343  BP: 130/70 132/72  Pulse: 67    Vitals:   07/25/16 1342  Weight: 179 lb (81.2 kg)  Height: 6' 2.5" (1.892 m)    Gen: resting comfortably, no acute distress HEENT: no scleral icterus, pupils equal round and reactive, no palptable cervical adenopathy,  CV: RRR, 3/6 systolic murmur rusb, no jvd Resp: Clear to auscultation bilaterally GI: abdomen is soft, non-tender, non-distended, normal bowel sounds, no hepatosplenomegaly MSK: extremities are warm, no edema.  Skin: warm, no rash Neuro:  no focal deficits Psych: appropriate affect   Diagnostic Studies Study Conclusions  - Left ventricle: The cavity size was mildly dilated. Wall   thickness was increased in a pattern of mild LVH. Systolic   function was normal. The estimated ejection fraction was in the   range of 60% to 65%.  Wall motion was normal; there were no   regional wall motion abnormalities. Left ventricular diastolic   function parameters were normal. - Aortic valve: Functionally bicuspid; moderately calcified   leaflets. There was moderate stenosis. There was moderate   regurgitation. Mean gradient (S): 32 mm Hg. Peak gradient (S): 60   mm Hg. VTI ratio of LVOT to aortic valve: 0.47. Valve area (VTI):   1.46 cm^2. Valve area (Vmax): 1.28 cm^2. Valve area (Vmean): 1.33   cm^2. - Mitral valve: Mildly thickened leaflets . There was mild   regurgitation. - Left  atrium: The atrium was mildly dilated. - Right atrium: The atrium was mildly dilated. Central venous   pressure (est): 8 mm Hg. - Tricuspid valve: There was trivial regurgitation. - Pulmonary arteries: PA peak pressure: 36 mm Hg (S). - Pericardium, extracardiac: There was no pericardial effusion.  Impressions:  - Mild LVH with mild LV chamber dilatation and LVEF 60-65%. Grossly   normal diastolic function. Mild biatrial enlargement. Mildly   thickened mitral leaflets with mild mitral regurgitation.   Functionally bicuspid aortic valve with moderate calcification   and evidence of moderate aortic stenosis as well as moderate   aortic regurgitation. Trivial tricuspid regurgitation with upper   normal PASP 36 mmHg.    Assessment and Plan  1. Aortic stenosis - apparent bicuspid AV by echo, moderate AS with mean grad 32 and AVA 1.3 - continue to monitor valve at this time, he does not meet criteria for intervention - he denies any symptoms. He reports generalized fatigue, but no SOB/dyspnea. Overall symptoms are not cardiac - given his bicuspid AV will need CTA to evaluate entire aorta to look for coexisting aortopathy. - EKG in clinic shows SR, LVH    F/u 6 mionths   Antoine Poche, M.D.

## 2016-07-25 NOTE — Patient Instructions (Signed)
Medication Instructions:  Your physician recommends that you continue on your current medications as directed. Please refer to the Current Medication list given to you today.   Labwork: NONE  Testing/Procedures: Non-Cardiac CT Angiography (CTA), is a special type of CT scan that uses a computer to produce multi-dimensional views of major blood vessels throughout the body. In CT angiography, a contrast material is injected through an IV to help visualize the blood vessels   Follow-Up: Your physician wants you to follow-up in: 6 MONTHS.  You will receive a reminder letter in the mail two months in advance. If you don't receive a letter, please call our office to schedule the follow-up appointment.   Any Other Special Instructions Will Be Listed Below (If Applicable).     If you need a refill on your cardiac medications before your next appointment, please call your pharmacy.   

## 2016-07-26 ENCOUNTER — Telehealth: Payer: Self-pay

## 2016-07-26 ENCOUNTER — Other Ambulatory Visit: Payer: Self-pay

## 2016-07-26 DIAGNOSIS — Q231 Congenital insufficiency of aortic valve: Secondary | ICD-10-CM

## 2016-07-26 NOTE — Telephone Encounter (Signed)
[  07/26/2016 1:04 PM] Raechel AcheGoins, TerryRoma Kayser:  Isaac Diaz Wednesday 6/27 register @ 2:45 Radiology. Nothing but water 4 hrs prior. Please advise patient    LM telling pt his CT has been moved to 6/27 and to call me when he gets this message

## 2016-07-29 ENCOUNTER — Encounter: Payer: Self-pay | Admitting: Cardiology

## 2016-08-01 ENCOUNTER — Other Ambulatory Visit: Payer: 59

## 2016-08-01 ENCOUNTER — Other Ambulatory Visit: Payer: Self-pay | Admitting: *Deleted

## 2016-08-01 ENCOUNTER — Ambulatory Visit (INDEPENDENT_AMBULATORY_CARE_PROVIDER_SITE_OTHER)
Admission: RE | Admit: 2016-08-01 | Discharge: 2016-08-01 | Disposition: A | Discharge status: Home / Self Care | Payer: 59 | Source: Ambulatory Visit | Attending: Cardiology | Admitting: Cardiology

## 2016-08-01 DIAGNOSIS — Z8774 Personal history of (corrected) congenital malformations of heart and circulatory system: Secondary | ICD-10-CM

## 2016-08-01 DIAGNOSIS — I359 Nonrheumatic aortic valve disorder, unspecified: Secondary | ICD-10-CM

## 2016-08-01 MED ORDER — IOPAMIDOL (ISOVUE-370) INJECTION 76%
100.0000 mL | Freq: Once | INTRAVENOUS | Status: AC | PRN
  Administered 2016-08-01: 100 mL via INTRAVENOUS

## 2016-08-07 ENCOUNTER — Ambulatory Visit (HOSPITAL_COMMUNITY): Payer: 59

## 2016-10-02 ENCOUNTER — Ambulatory Visit: Payer: 59 | Admitting: Urology

## 2016-11-19 ENCOUNTER — Encounter (HOSPITAL_COMMUNITY): Payer: Self-pay | Admitting: Emergency Medicine

## 2016-11-19 ENCOUNTER — Emergency Department (HOSPITAL_COMMUNITY): Payer: 59

## 2016-11-19 ENCOUNTER — Emergency Department (HOSPITAL_COMMUNITY)
Admission: EM | Admit: 2016-11-19 | Discharge: 2016-11-19 | Disposition: A | Discharge status: Home / Self Care | Payer: 59 | Attending: Emergency Medicine | Admitting: Emergency Medicine

## 2016-11-19 DIAGNOSIS — R011 Cardiac murmur, unspecified: Secondary | ICD-10-CM | POA: Insufficient documentation

## 2016-11-19 DIAGNOSIS — F17210 Nicotine dependence, cigarettes, uncomplicated: Secondary | ICD-10-CM | POA: Diagnosis not present

## 2016-11-19 DIAGNOSIS — J189 Pneumonia, unspecified organism: Secondary | ICD-10-CM | POA: Diagnosis not present

## 2016-11-19 DIAGNOSIS — R05 Cough: Secondary | ICD-10-CM | POA: Diagnosis present

## 2016-11-19 LAB — CBC
HEMATOCRIT: 44.3 % (ref 39.0–52.0)
Hemoglobin: 15.3 g/dL (ref 13.0–17.0)
MCH: 32.8 pg (ref 26.0–34.0)
MCHC: 34.5 g/dL (ref 30.0–36.0)
MCV: 95.1 fL (ref 78.0–100.0)
PLATELETS: 174 10*3/uL (ref 150–400)
RBC: 4.66 MIL/uL (ref 4.22–5.81)
RDW: 12.8 % (ref 11.5–15.5)
WBC: 6.7 10*3/uL (ref 4.0–10.5)

## 2016-11-19 LAB — I-STAT TROPONIN, ED: Troponin i, poc: 0.03 ng/mL (ref 0.00–0.08)

## 2016-11-19 LAB — BASIC METABOLIC PANEL
Anion gap: 10 (ref 5–15)
BUN: 7 mg/dL (ref 6–20)
CHLORIDE: 102 mmol/L (ref 101–111)
CO2: 25 mmol/L (ref 22–32)
Calcium: 9.3 mg/dL (ref 8.9–10.3)
Creatinine, Ser: 0.86 mg/dL (ref 0.61–1.24)
Glucose, Bld: 79 mg/dL (ref 65–99)
POTASSIUM: 3.8 mmol/L (ref 3.5–5.1)
SODIUM: 137 mmol/L (ref 135–145)

## 2016-11-19 MED ORDER — IBUPROFEN 600 MG PO TABS: 600.0000 mg | ORAL_TABLET | Freq: Four times a day (QID) | ORAL | 0 refills | Status: DC | PRN

## 2016-11-19 MED ORDER — ALBUTEROL SULFATE HFA 108 (90 BASE) MCG/ACT IN AERS: 2.0000 | INHALATION_SPRAY | RESPIRATORY_TRACT | 3 refills | Status: DC | PRN

## 2016-11-19 MED ORDER — BENZONATATE 100 MG PO CAPS: 100.0000 mg | ORAL_CAPSULE | Freq: Three times a day (TID) | ORAL | 0 refills | Status: DC

## 2016-11-19 MED ORDER — IPRATROPIUM-ALBUTEROL 0.5-2.5 (3) MG/3ML IN SOLN
3.0000 mL | Freq: Once | RESPIRATORY_TRACT | Status: AC
  Administered 2016-11-19: 3 mL via RESPIRATORY_TRACT
  Filled 2016-11-19: qty 3

## 2016-11-19 MED ORDER — PREDNISONE 20 MG PO TABS
40.0000 mg | ORAL_TABLET | Freq: Once | ORAL | Status: AC
  Administered 2016-11-19: 40 mg via ORAL
  Filled 2016-11-19: qty 2

## 2016-11-19 NOTE — ED Triage Notes (Signed)
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 Cough started after that

## 2016-11-19 NOTE — ED Notes (Signed)
Reports not using 3 M mask at work on Thursday as required.  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  Productive cough, thick brown- black thick secretions coughed up, starting on Friday morning.  The cough has persisted and has been sent home by employer on 2 occasions.  C/o chest discomfort with cough.

## 2016-11-19 NOTE — ED Provider Notes (Addendum)
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 The next day he was short of breath and coughing up cement tinged phlegm. He then started to develop some chest pain with coughing. Feeling a little more short of breath. 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 He never has pain when doing this. There is no swelling of his legs.  On exam he has no abnormal lung sounds, normal heart sounds, no edema.  EKG interpreted, shows some LVH with repolarization abnormality, troponin is negative, doubt that this is coronary syndrome, more likely related to pneumonitis.  Patient informed of results, stable for discharge on ibuprofen.  Medical screening examination/treatment/procedure(s) were conducted as a shared visit with non-physician practitioner(s) and myself.  I personally evaluated the patient during the encounter.  Clinical Impression: .  Final diagnoses:  Pneumonitis   Tessalon Albuterol Motrin    EKG Interpretation  Date/Time:  Tuesday November 19 2016 16:48:28 EDT Ventricular Rate:  55 PR Interval:    QRS Duration: 103 QT Interval:  429 QTC Calculation: 411 R Axis:   73 Text Interpretation:  Sinus rhythm Short PR interval Left ventricular hypertrophy Anterolateral ST elevation, probable early repolarization No old tracing to compare Confirmed by Eber Hong ([id number]) on 11/19/2016 5:51:35 PM         Eber Hong, MD 11/19/16 1755    Eber Hong, MD 11/22/16 [id number]

## 2016-11-19 NOTE — Discharge Instructions (Signed)
Your tests are normal.  They do show an enlarged heart which was also seen on your ultrasound from May.  Ibuprofen for pain Albuterol for shortness of breath Tessalon for coughing  See your doctor in 3 days for recheck

## 2016-11-19 NOTE — ED Provider Notes (Signed)
[facility]-EMERGENCY DEPT Provider Note   CSN: [id number] Arrival date & time: 11/19/16  1534     History   Chief Complaint Chief Complaint  Patient presents with  . Cough    HPI Isaac Diaz is a 51 y.o. male with a history of aortic valve stenosis who presents today for evaluation of cough.  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  He says that Friday morning he developed a cough, has been coughing up thick brown mucus.  He reports he feels congested.  He was sent home by his employer twice due to coughing at work.  He denies fevers or chills.  He smokes about 1/2 a pack a day.   His cardiologist is Dr. Wyline Mood.  He reports that he has been having chest pain since Friday.  He says that at rest his pain is a 5/10 and when he breathes his pain increases to 10/10.  His chest pain feels sharp and radiates to his back.  It is made better by sitting still.    He has a remote history of asthma when he was in high school and had an inhaler then.    HPI  Past Medical History:  Diagnosis Date  . Allergy   . Enlarged prostate   . Heart murmur     Patient Active Problem List   Diagnosis Date Noted  . 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 Tobacco abuse 06/21/2016  . Dyspnea 06/21/2016  . Fatigue 06/21/2016  . Aortic atherosclerosis (HCC) 06/21/2016    Past Surgical History:  Procedure Laterality Date  . AMPUTATION FINGER Right    fingertip right long       Home Medications    Prior to Admission medications   Not on File    Family History Family History  Problem Relation Age of Onset  . Cancer Mother        stomach  . Stroke Father 2  . Aneurysm Sister        brain aneurysm  . Cancer Maternal Grandfather        lung    Social History Social History  Substance Use Topics  . Smoking status: Current Every Day Smoker    Packs/day: 1.00   Years: 28.00    Types: Cigarettes    Start date: 02/12/1988  . Smokeless tobacco: Never Used  . Alcohol use Yes     Comment:  weekly     Allergies   Other and Chocolate   Review of Systems Review of Systems  Constitutional: Negative for chills and fever.  HENT: Positive for congestion. Negative for drooling, ear discharge, ear pain, rhinorrhea, sinus pain, sore throat and voice change.   Eyes: Negative for pain and visual disturbance.  Respiratory: Positive for cough and chest tightness. Negative for shortness of breath.   Cardiovascular: Positive for chest pain. Negative for palpitations and leg swelling.  Gastrointestinal: Negative for abdominal pain, nausea and vomiting.  Genitourinary: Negative for dysuria and hematuria.  Musculoskeletal: Negative for arthralgias and back pain.  Skin: Negative for color change and rash.  Neurological: Negative for seizures, syncope and headaches.  All other systems reviewed and are negative.    Physical Exam Updated Vital Signs BP (!) 170/92 (BP Location: Right Arm)   Pulse 83   Temp 98.5 F (36.9 C) (Oral)   Resp 17   Ht 6'  3" (1.905 m)   Wt 83.9 kg (185 lb)   SpO2 100%   BMI 23.12 kg/m   Physical Exam  Constitutional: He appears well-developed and well-nourished.  HENT:  Head: Normocephalic and atraumatic.  Right Ear: External ear normal.  Left Ear: External ear normal.  Nose: Nose normal.  Mouth/Throat: Oropharynx is clear and moist. No oropharyngeal exudate.  Eyes: Conjunctivae are normal.  Neck: Normal range of motion. Neck supple.  Cardiovascular: Normal rate, regular rhythm, normal heart sounds and intact distal pulses.   No murmur heard. Palpation of his chest and back does not re-create or exacerbate his chest pain.   Pulmonary/Chest: Effort normal. No accessory muscle usage or stridor. No tachypnea. No respiratory distress. He has decreased breath sounds. He has no wheezes. He has no rhonchi. He has no rales.  No  wheezing but decreased breath sounds diffusely bilaterally.    Abdominal: Soft. He exhibits no distension. There is no tenderness.  Musculoskeletal: He exhibits no edema, tenderness or deformity.  Lymphadenopathy:    He has no cervical adenopathy.  Neurological: He is alert.  Skin: Skin is warm and dry. He is not diaphoretic.  Psychiatric: He has a normal mood and affect.  Nursing note and vitals reviewed.    ED Treatments / Results  Labs (all labs ordered are listed, but only abnormal results are displayed) Labs Reviewed - No data to display  EKG  EKG Interpretation None       Radiology Dg Chest 2 View  Result Date: 11/19/2016 CLINICAL DATA:  Productive cough and congestion that started Friday. Pain at base of both scapulas. Hurts to take in a deep breath, SOB. EXAM: CHEST  2 VIEW COMPARISON:  None. FINDINGS: The heart size and mediastinal contours are within normal limits. Both lungs are clear. The visualized skeletal structures are unremarkable. IMPRESSION: No active cardiopulmonary disease. Electronically Signed   By: Elige Ko   On: 11/19/2016 16:12    Procedures Procedures (including critical care time)  Medications Ordered in ED Medications  ipratropium-albuterol (DUONEB) 0.5-2.5 (3) MG/3ML nebulizer solution 3 mL (not administered)  predniSONE (DELTASONE) tablet 40 mg (not administered)     Initial Impression / Assessment and Plan / ED Course  I have reviewed the triage vital signs and the nursing notes.  Pertinent labs & imaging results that were available during my care of the patient were reviewed by me and considered in my medical decision making (see chart for details).    Due to concern of chest pain that patient reports is 5/10 at rest I feel that patient needs more care than can be provided in the fast track setting of the ED.  I discussed the patient with Dr. Hyacinth Meeker who assumed care of the patient and agreed with moving him to the main ED.  Final  Clinical Impressions(s) / ED Diagnoses   Final diagnoses:  Pneumonitis    New Prescriptions New Prescriptions   No medications on file     Norman Clay 11/21/16 1124    Eber Hong, MD 11/22/16 [phone]

## 2017-04-21 ENCOUNTER — Encounter: Payer: Self-pay | Admitting: Family Medicine

## 2017-09-22 ENCOUNTER — Emergency Department (HOSPITAL_COMMUNITY): Payer: PRIVATE HEALTH INSURANCE

## 2017-09-22 ENCOUNTER — Encounter (HOSPITAL_COMMUNITY): Payer: Self-pay | Admitting: Emergency Medicine

## 2017-09-22 ENCOUNTER — Emergency Department (HOSPITAL_COMMUNITY)
Admission: EM | Admit: 2017-09-22 | Discharge: 2017-09-22 | Disposition: A | Discharge status: Home / Self Care | Payer: PRIVATE HEALTH INSURANCE | Attending: Emergency Medicine | Admitting: Emergency Medicine

## 2017-09-22 ENCOUNTER — Other Ambulatory Visit: Payer: Self-pay

## 2017-09-22 DIAGNOSIS — F17210 Nicotine dependence, cigarettes, uncomplicated: Secondary | ICD-10-CM | POA: Insufficient documentation

## 2017-09-22 DIAGNOSIS — J069 Acute upper respiratory infection, unspecified: Secondary | ICD-10-CM | POA: Diagnosis not present

## 2017-09-22 DIAGNOSIS — R0602 Shortness of breath: Secondary | ICD-10-CM | POA: Diagnosis present

## 2017-09-22 MED ORDER — ALBUTEROL SULFATE HFA 108 (90 BASE) MCG/ACT IN AERS
2.0000 | INHALATION_SPRAY | RESPIRATORY_TRACT | Status: DC | PRN
  Administered 2017-09-22: 2 via RESPIRATORY_TRACT
  Filled 2017-09-22: qty 6.7

## 2017-09-22 MED ORDER — IPRATROPIUM-ALBUTEROL 0.5-2.5 (3) MG/3ML IN SOLN
3.0000 mL | Freq: Once | RESPIRATORY_TRACT | Status: AC
  Administered 2017-09-22: 3 mL via RESPIRATORY_TRACT
  Filled 2017-09-22: qty 3

## 2017-09-22 MED ORDER — AZITHROMYCIN 250 MG PO TABS: 250.0000 mg | ORAL_TABLET | Freq: Every day | ORAL | 0 refills | Status: DC

## 2017-09-22 MED ORDER — PREDNISONE 20 MG PO TABS: 40.0000 mg | ORAL_TABLET | Freq: Every day | ORAL | 0 refills | Status: DC

## 2017-09-22 NOTE — ED Triage Notes (Signed)
Patient complaining of cough and shortness of breath since Thursday. States he is coughing up brown sputum.

## 2017-09-22 NOTE — ED Provider Notes (Signed)
[facility] Provider Note   CSN: [id number] Arrival date & time: 09/22/17  1020     History   Chief Complaint Chief Complaint  Patient presents with  . Shortness of Breath    HPI Isaac Diaz is a 52 y.o. male.  HPI Patient presents with shortness of breath.  States began Thursday at work with today being Monday.  Has had cough with brown sputum production.  No fever.  States he thought if he took it easy over the weekend he would do better but still has not really improved.  Dull chest tightness.  History of asthma as a child but states he has not had that in years.  Patient is a cigarette smoker however.  No abdominal pain.  No swelling in his legs.  No known sick contacts. Past Medical History:  Diagnosis Date  . Allergy   . Enlarged prostate   . Heart murmur     Patient Active Problem List   Diagnosis Date Noted  . 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 Tobacco abuse 06/21/2016  . Dyspnea 06/21/2016  . Fatigue 06/21/2016  . Aortic atherosclerosis (HCC) 06/21/2016    Past Surgical History:  Procedure Laterality Date  . AMPUTATION FINGER Right    fingertip right long        Home Medications    Prior to Admission medications   Medication Sig Start Date End Date Taking? Authorizing Provider  azithromycin (ZITHROMAX) 250 MG tablet Take 1 tablet (250 mg total) by mouth daily. Take first 2 tablets together, then 1 every day until finished. 09/22/17   Benjiman CorePickering, Kaityln Kallstrom, MD  ibuprofen (ADVIL,MOTRIN) 600 MG tablet Take 1 tablet (600 mg total) by mouth every 6 (six) hours as needed. Patient taking differently: Take 200 mg by mouth every 6 (six) hours as needed.  11/19/16   Eber HongMiller, Brian, MD  predniSONE (DELTASONE) 20 MG tablet Take 2 tablets (40 mg total) by mouth daily. 09/22/17   Benjiman CorePickering, Lariah Fleer, MD    Family History Family History  Problem Relation Age of Onset  . Cancer Mother        stomach  . Stroke Father  2055  . Aneurysm Sister        brain aneurysm  . Cancer Maternal Grandfather        lung    Social History Social History   Tobacco Use  . Smoking status: Current Every Day Smoker    Packs/day: 1.00    Years: 28.00    Pack years: 28.00    Types: Cigarettes    Start date: 02/12/1988  . Smokeless tobacco: Never Used  Substance Use Topics  . Alcohol use: Yes    Comment:  weekly  . Drug use: Yes    Types: Marijuana    Comment: rare     Allergies   Other and Chocolate   Review of Systems Review of Systems  Constitutional: Negative for chills and fever.  Respiratory: Positive for cough and shortness of breath.   Cardiovascular: Negative for chest pain.  Gastrointestinal: Negative for abdominal pain.  Genitourinary: Negative for flank pain.  Musculoskeletal: Negative for back pain.  Skin: Negative for rash.  Neurological: Negative for weakness.  Hematological: Negative for adenopathy.  Psychiatric/Behavioral: Negative for confusion.     Physical Exam Updated Vital Signs BP (!) 143/81   Pulse (!) 54   Temp 98.1 F (36.7 C) (Oral)   Resp 20   Ht 6\' 2"  (1.88 m)  Wt 81.6 kg   SpO2 98%   BMI 23.11 kg/m   Physical Exam  Constitutional: He appears well-developed.  HENT:  Head: Atraumatic.  Eyes: EOM are normal.  Cardiovascular: Regular rhythm.  Pulmonary/Chest:  Frequent coughs with mild scattered wheezes without focal rales or rhonchi.  Abdominal: Soft.  Musculoskeletal:       Right lower leg: He exhibits no edema.       Left lower leg: He exhibits no edema.  Neurological: He is alert.  Skin: Skin is warm. Capillary refill takes less than 2 seconds.     ED Treatments / Results  Labs (all labs ordered are listed, but only abnormal results are displayed) Labs Reviewed - No data to display  EKG None  Radiology Dg Chest 2 View  Result Date: 09/22/2017 CLINICAL DATA:  Productive cough and shortness of breath for the past week. EXAM: CHEST - 2 VIEW  COMPARISON:  Chest x-ray dated November 19, 2016. FINDINGS: The heart size and mediastinal contours are within normal limits. Normal pulmonary vascularity. Atherosclerotic calcification of the aortic arch. Mild hyperinflation. No focal consolidation, pleural effusion, or pneumothorax. No acute osseous abnormality. IMPRESSION: 1.  No active cardiopulmonary disease. 2. Mild hyperinflation may reflect COPD. Electronically Signed   By: Obie DredgeWilliam T Derry M.D.   On: 09/22/2017 11:29    Procedures Procedures (including critical care time)  Medications Ordered in ED Medications  albuterol (PROVENTIL HFA;VENTOLIN HFA) 108 (90 Base) MCG/ACT inhaler 2 puff (has no administration in time range)  ipratropium-albuterol (DUONEB) 0.5-2.5 (3) MG/3ML nebulizer solution 3 mL (3 mLs Nebulization Given 09/22/17 1102)     Initial Impression / Assessment and Plan / ED Course  I have reviewed the triage vital signs and the nursing notes.  Pertinent labs & imaging results that were available during my care of the patient were reviewed by me and considered in my medical decision making (see chart for details).     Patient with shortness of breath.  Cough with brown sputum production.  Is a current smoker but no previous COPD diagnosis.  However x-ray shows possible COPD changes.  Not hypoxic.  Will discharge home but will add steroids due to the wheezing and azithromycin due to the possibility of COPD.  Discharge home but will need a PCP follow-up for the difficulty breathing peer  Final Clinical Impressions(s) / ED Diagnoses   Final diagnoses:  Upper respiratory tract infection, unspecified type    ED Discharge Orders         Ordered    predniSONE (DELTASONE) 20 MG tablet  Daily     09/22/17 1237    azithromycin (ZITHROMAX) 250 MG tablet  Daily     09/22/17 1237           Benjiman CorePickering, Awad Gladd, MD 09/22/17 1242

## 2017-09-22 NOTE — Discharge Instructions (Addendum)
Follow-up with a primary care doctor for further management of your difficulty breathing.

## 2017-09-22 NOTE — ED Notes (Signed)
Respiratory paged at this time for tx.  

## 2017-09-22 NOTE — ED Notes (Signed)
Respiratory paged at this time for inhaler.

## 2018-11-25 ENCOUNTER — Other Ambulatory Visit: Payer: Self-pay | Admitting: *Deleted

## 2018-11-25 DIAGNOSIS — Z20822 Contact with and (suspected) exposure to covid-19: Secondary | ICD-10-CM

## 2018-11-26 LAB — NOVEL CORONAVIRUS, NAA: SARS-CoV-2, NAA: NOT DETECTED

## 2018-11-27 ENCOUNTER — Telehealth: Payer: Self-pay | Admitting: General Practice

## 2018-11-27 NOTE — Telephone Encounter (Signed)
Negative COVID results given. Patient results "NOT Detected." Caller expressed understanding. ° °

## 2019-04-07 ENCOUNTER — Telehealth: Payer: Self-pay

## 2019-04-07 NOTE — Telephone Encounter (Signed)
Virtual Visit Pre-Appointment Phone Call  "(Name), I am calling you today to discuss your upcoming appointment. We are currently trying to limit exposure to the virus that causes COVID-19 by seeing patients at home rather than in the office."  1. "What is the BEST phone number to call the day of the visit?" - include this in appointment notes  2. "Do you have or have access to (through a family member/friend) a smartphone with video capability that we can use for your visit?" a. If yes - list this number in appt notes as "cell" (if different from BEST phone #) and list the appointment type as a VIDEO visit in appointment notes b. If no - list the appointment type as a PHONE visit in appointment notes  Confirm consent - "In the setting of the current Covid19 crisis, you are scheduled for a (phone or video) visit with your provider on (date) at (time).  Just as we do with many in-office visits, in order for you to participate in this visit, we must obtain consent.  If you'd like, I can send this to your mychart (if signed up) or email for you to review.  Otherwise, I can obtain your verbal consent now.  All virtual visits are billed to your insurance company just like a normal visit would be.  By agreeing to a virtual visit, we'd like you to understand that the technology does not allow for your provider to perform an examination, and thus may limit your provider's ability to fully assess your condition. If your provider identifies any concerns that need to be evaluated in person, we will make arrangements to do so.  Finally, though the technology is pretty good, we cannot assure that it will always work on either your or our end, and in the setting of a video visit, we may have to convert it to a phone-only visit.  In either situation, we cannot ensure that we have a secure connection.  Are you willing to proceed?" STAFF: Did the patient verbally acknowledge consent to telehealth visit? Document  YES/NO here:  3. Advise patient to be prepared - "Two hours prior to your appointment, go ahead and check your blood pressure, pulse, oxygen saturation, and your weight (if you have the equipment to check those) and write them all down. When your visit starts, your provider will ask you for this information. If you have an Apple Watch or Kardia device, please plan to have heart rate information ready on the day of your appointment. Please have a pen and paper handy nearby the day of the visit as well."  4. Give patient instructions for MyChart download to smartphone OR [email or web address] as below if video visit (depending on what platform provider is using)  5. Inform patient they will receive a phone call 15 minutes prior to their appointment time (may be from unknown caller ID) so they should be prepared to answer    TELEPHONE CALL NOTE  Isaac Diaz has been deemed a candidate for a follow-up tele-health visit to limit community exposure during the Covid-19 pandemic. I spoke with the patient via phone to ensure availability of phone/video source, confirm preferred email & phone number, and discuss instructions and expectations.  I reminded Isaac Diaz to be prepared with any vital sign and/or heart rhythm information that could potentially be obtained via home monitoring, at the time of his visit. I reminded Isaac Diaz to expect a phone call prior to his visit.  Gracy Bruins 04/07/2019 9:11 AM   INSTRUCTIONS FOR DOWNLOADING THE MYCHART APP TO SMARTPHONE  - The patient must first make sure to have activated MyChart and know their login information - If Apple, go to App Store and type in MyChart in the search bar and download the app. If Android, ask patient to go to [employer] and type in [place] in the search bar and download the app. The app is free but as with any other app downloads, their phone may require them to verify saved payment information or  Apple/Android password.  - The patient will need to then log into the app with their MyChart username and password, and select  as their healthcare provider to link the account. When it is time for your visit, go to the MyChart app, find appointments, and click Begin Video Visit. Be sure to Select Allow for your device to access the Microphone and Camera for your visit. You will then be connected, and your provider will be with you shortly.  **If they have any issues connecting, or need assistance please contact MyChart service desk [phone])**  **If using a computer, in order to ensure the best quality for their visit they will need to use either of the following Internet Browsers: [employer] or Google Chrome**  IF USING DOXIMITY or DOXY.ME - The patient will receive a link just prior to their visit by text.     FULL LENGTH CONSENT FOR TELE-HEALTH VISIT   I hereby voluntarily request, consent and authorize [facility] and its employed or contracted physicians, physician assistants, nurse practitioners or other licensed health care professionals (the Practitioner), to provide me with telemedicine health care services (the "Services") as deemed necessary by the treating Practitioner. I acknowledge and consent to receive the Services by the Practitioner via telemedicine. I understand that the telemedicine visit will involve communicating with the Practitioner through live audiovisual communication technology and the disclosure of certain medical information by electronic transmission. I acknowledge that I have been given the opportunity to request an in-person assessment or other available alternative prior to the telemedicine visit and am voluntarily participating in the telemedicine visit.  I understand that I have the right to withhold or withdraw my consent to the use of telemedicine in the course of my care at any time, without affecting my right to future care  or treatment, and that the Practitioner or I may terminate the telemedicine visit at any time. I understand that I have the right to inspect all information obtained and/or recorded in the course of the telemedicine visit and may receive copies of available information for a reasonable fee.  I understand that some of the potential risks of receiving the Services via telemedicine include:  Marland Kitchen Delay or interruption in medical evaluation due to technological equipment failure or disruption; . Information transmitted may not be sufficient (e.g. poor resolution of images) to allow for appropriate medical decision making by the Practitioner; and/or  . In rare instances, security protocols could fail, causing a breach of personal health information.  Furthermore, I acknowledge that it is my responsibility to provide information about my medical history, conditions and care that is complete and accurate to the best of my ability. I acknowledge that Practitioner's advice, recommendations, and/or decision may be based on factors not within their control, such as incomplete or inaccurate data provided by me or distortions of diagnostic images or specimens that may result from electronic transmissions. I understand that the practice  of medicine is not an Chief Strategy Officer and that Practitioner makes no warranties or guarantees regarding treatment outcomes. I acknowledge that I will receive a copy of this consent concurrently upon execution via email to the email address I last provided but may also request a printed copy by calling the office of [place].    I understand that my insurance will be billed for this visit.   I have read or had this consent read to me. . I understand the contents of this consent, which adequately explains the benefits and risks of the Services being provided via telemedicine.  . I have been provided ample opportunity to ask questions regarding this consent and the Services and have had  my questions answered to my satisfaction. . I give my informed consent for the services to be provided through the use of telemedicine in my medical care  By participating in this telemedicine visit I agree to the above.

## 2019-04-08 ENCOUNTER — Telehealth: Payer: PRIVATE HEALTH INSURANCE | Admitting: Physician Assistant

## 2019-04-08 NOTE — Progress Notes (Unsigned)
Virtual Visit via Telephone Note   This visit type was conducted due to national recommendations for restrictions regarding the COVID-19 Pandemic (e.g. social distancing) in an effort to limit this patient's exposure and mitigate transmission in our community.  Due to his co-morbid illnesses, this patient is at least at moderate risk for complications without adequate follow up.  This format is felt to be most appropriate for this patient at this time.  The patient did not have access to video technology/had technical difficulties with video requiring transitioning to audio format only (telephone).  All issues noted in this document were discussed and addressed.  No physical exam could be performed with this format.  Please refer to the patient's chart for his  consent to telehealth for [facility].  Evaluation Performed:  Follow-up visit  This visit type was conducted due to national recommendations for restrictions regarding the COVID-19 Pandemic (e.g. social distancing).  This format is felt to be most appropriate for this patient at this time.  All issues noted in this document were discussed and addressed.  No physical exam was performed (except for noted visual exam findings with Video Visits).  Please refer to the patient's chart (MyChart message for video visits and phone note for telephone visits) for the patient's consent to telehealth for [facility]  Date:  04/08/2019   ID:  Isaac Diaz, DOB 1965/06/30, MRN [id number]  Patient Location:  ***  Provider location:   office  PCP:  Patient, No Pcp Per  Cardiologist:  Dina Rich, MD 07/25/2016 Electrophysiologist:  None   Chief Complaint:  ***  History of Present Illness:    Isaac Diaz is a 54 y.o. male who presents via audio/video conferencing for a telehealth visit today.    54 y.o. yo male who has a hx of aortic stenosis, probable bicuspid valve, BPH, tob use, possible COPD  ***   The  patient {does/does not:200015} have symptoms concerning for COVID-19 infection (fever, chills, cough, or new shortness of breath).    Prior CV studies:   The following studies were reviewed today:  ECHO: 06/28/2016 - Left ventricle: The cavity size was mildly dilated. Wall  thickness was increased in a pattern of mild LVH. Systolic  function was normal. The estimated ejection fraction was in the  range of 60% to 65%. Wall motion was normal; there were no  regional wall motion abnormalities. Left ventricular diastolic  function parameters were normal.  - Aortic valve: Functionally bicuspid; moderately calcified  leaflets. There was moderate stenosis. There was moderate  regurgitation. Mean gradient (S): 32 mm Hg. Peak gradient (S): 60  mm Hg. VTI ratio of LVOT to aortic valve: 0.47. Valve area (VTI):  1.46 cm^2. Valve area (Vmax): 1.28 cm^2. Valve area (Vmean): 1.33  cm^2.  - Mitral valve: Mildly thickened leaflets . There was mild  regurgitation.  - Left atrium: The atrium was mildly dilated.  - Right atrium: The atrium was mildly dilated. Central venous  pressure (est): 8 mm Hg.  - Tricuspid valve: There was trivial regurgitation.  - Pulmonary arteries: PA peak pressure: 36 mm Hg (S).  - Pericardium, extracardiac: There was no pericardial effusion.   Past Medical History:  Diagnosis Date  . Allergy   . Enlarged prostate   . Heart murmur    Past Surgical History:  Procedure Laterality Date  . AMPUTATION FINGER Right    fingertip right long     No outpatient medications have  been marked as taking for the 04/08/19 encounter (Appointment) with Carole Deere, Evelene Croon, PA-C.     Allergies:   Other and Chocolate   Social History   Tobacco Use  . Smoking status: Current Every Day Smoker    Packs/day: 1.00    Years: 28.00    Pack years: 28.00    Types: Cigarettes    Start date: 02/12/1988  . Smokeless tobacco: Never Used  Substance Use Topics  . Alcohol  use: Yes    Comment:  weekly  . Drug use: Yes    Types: Marijuana    Comment: rare     Family Hx: The patient's family history includes Aneurysm in his sister; Cancer in his maternal grandfather and mother; Stroke (age of onset: 78) in his father.  ROS:   Please see the history of present illness.    All other systems reviewed and are negative.   Labs/Other Tests and Data Reviewed:    Recent Labs: No results found for requested labs within last 8760 hours.   CBC    Component Value Date/Time   WBC 6.7 11/19/2016 1650   RBC 4.66 11/19/2016 1650   HGB 15.3 11/19/2016 1650   HCT 44.3 11/19/2016 1650   PLT 174 11/19/2016 1650   MCV 95.1 11/19/2016 1650   MCH 32.8 11/19/2016 1650   MCHC 34.5 11/19/2016 1650   RDW 12.8 11/19/2016 1650    CMP Latest Ref Rng & Units 11/19/2016 06/21/2016  Glucose 65 - 99 mg/dL 79 66  BUN 6 - 20 mg/dL 7 13  Creatinine 0.61 - 1.24 mg/dL 0.86 1.11  Sodium 135 - 145 mmol/L 137 140  Potassium 3.5 - 5.1 mmol/L 3.8 4.5  Chloride 101 - 111 mmol/L 102 109  CO2 22 - 32 mmol/L 25 23  Calcium 8.9 - 10.3 mg/dL 9.3 9.4  Total Protein 6.1 - 8.1 g/dL - 6.6  Total Bilirubin 0.2 - 1.2 mg/dL - 0.6  Alkaline Phos 40 - 115 U/L - 56  AST 10 - 35 U/L - 14  ALT 9 - 46 U/L - 8(L)     Recent Lipid Panel Lab Results  Component Value Date/Time   CHOL 147 06/21/2016 10:46 AM   TRIG 138 06/21/2016 10:46 AM   HDL 38 (L) 06/21/2016 10:46 AM   CHOLHDL 3.9 06/21/2016 10:46 AM   LDLCALC 81 06/21/2016 10:46 AM    Wt Readings from Last 3 Encounters:  09/22/17 180 lb (81.6 kg)  11/19/16 185 lb (83.9 kg)  07/25/16 179 lb (81.2 kg)     Objective:    Vital Signs:  There were no vitals taken for this visit.   54 y.o. male in no*** acute distress.   ASSESSMENT & PLAN:    1.  ***  COVID-19 Education: The signs and symptoms of COVID-19 were discussed with the patient and how to seek care for testing (follow up with PCP or arrange E-visit).  The importance of  social distancing was discussed today.  Patient Risk:   After full review of this patient's clinical status, I feel that they are at least moderate risk at this time.  Time:   Today, I have spent *** minutes with the patient with telehealth technology discussing ***.     Medication Adjustments/Labs and Tests Ordered: Current medicines are reviewed at length with the patient today.  Concerns regarding medicines are outlined above.  Tests Ordered: No orders of the defined types were placed in this encounter.  Medication Changes: No orders of  the defined types were placed in this encounter.   Disposition:  Follow up with Dina Rich, MD   Signed, Theodore Demark, PA-C  04/08/2019 12:49 PM    [facility]

## 2019-04-09 ENCOUNTER — Telehealth: Payer: Self-pay

## 2019-04-09 NOTE — Telephone Encounter (Signed)

## 2019-04-15 NOTE — Progress Notes (Addendum)
Virtual Visit via Telephone Note   This visit type was conducted due to national recommendations for restrictions regarding the COVID-19 Pandemic (e.g. social distancing) in an effort to limit this patient's exposure and mitigate transmission in our community.  Due to his co-morbid illnesses, this patient is at least at moderate risk for complications without adequate follow up.  This format is felt to be most appropriate for this patient at this time.  The patient did not have access to video technology/had technical difficulties with video requiring transitioning to audio format only (telephone).  All issues noted in this document were discussed and addressed.  No physical exam could be performed with this format.  Please refer to the patient's chart for his  consent to telehealth for [facility].   Date:  04/16/2019   ID:  Lacie Scotts, DOB 07-15-1965, MRN [id number]  Patient Location: Home Provider Location: Office  PCP:  Patient, No Pcp Per  Cardiologist:  Carlyle Dolly, MD  Electrophysiologist:  None   Evaluation Performed:  Follow-Up Visit  Chief Complaint: No complaints; Overdue Visit  History of Present Illness:    Isaac Diaz is a 54 y.o. male with past medical history of aortic stenosis (moderate by prior echocardiogram in 2018) and tobacco use who presents for an overdue follow-up telehealth visit.   He was last examined by Dr. Harl Bowie in 07/2016 and denied any chest pain or dyspnea on exertion at that time. Reported generalized fatigue which was felt to not be cardiac in etiology. A CTA was recommended to evaluate for any co-existing aortic abnormalities and this was performed in 07/2016 and showed no evidence of aneurysm or dissection.   In talking with the patient today, he reports being cautious given the pandemic and wears a mask when out in public. He did travel to [facility] to visit his son several times last year and had to be tested multiple times for COVID-19  upon returning home but always tested negative.  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 He stays active on the weekends and plays basketball at local parks. He reports playing full court and denies any chest pain or significant dyspnea when running. Says he "feels like he is still 19". Denies any lightheadedness, dizziness or presyncope. No recent orthopnea, PND or lower extremity edema.  He does continue to smoke approximately 0.75 ppd.   The patient does not have symptoms concerning for COVID-19 infection (fever, chills, cough, or new shortness of breath).    Past Medical History:  Diagnosis Date  . Allergy   . Enlarged prostate   . Heart murmur    Past Surgical History:  Procedure Laterality Date  . AMPUTATION FINGER Right    fingertip right long     No outpatient medications have been marked as taking for the 04/16/19 encounter (Telemedicine) with Ahmed Prima, Fransisco Hertz, PA-C.     Allergies:   Other and Chocolate   Social History   Tobacco Use  . Smoking status: Current Every Day Smoker    Packs/day: 0.50    Years: 28.00    Pack years: 14.00    Types: Cigarettes    Start date: 02/12/1988  . Smokeless tobacco: Never Used  Substance Use Topics  . Alcohol use: Yes    Comment:  weekly  . Drug use: Yes    Types: Marijuana    Comment: rare     Family  Hx: The patient's family history includes Aneurysm in his sister; Cancer in his maternal grandfather and mother; Stroke (age of onset: 20) in his father.  ROS:   Please see the history of present illness.     All other systems reviewed and are negative.   Prior CV studies:   The following studies were reviewed today:  CTA: 07/2016 IMPRESSION: Mild atherosclerotic irregularity throughout the descending thoracic and abdominal aorta. No evidence of aneurysm or dissection.  Aortic valve calcifications.  No acute findings in the chest, abdomen or  pelvis.   Echocardiogram: 06/2016 Study Conclusions   - Left ventricle: The cavity size was mildly dilated. Wall  thickness was increased in a pattern of mild LVH. Systolic  function was normal. The estimated ejection fraction was in the  range of 60% to 65%. Wall motion was normal; there were no  regional wall motion abnormalities. Left ventricular diastolic  function parameters were normal.  - Aortic valve: Functionally bicuspid; moderately calcified  leaflets. There was moderate stenosis. There was moderate  regurgitation. Mean gradient (S): 32 mm Hg. Peak gradient (S): 60  mm Hg. VTI ratio of LVOT to aortic valve: 0.47. Valve area (VTI):  1.46 cm^2. Valve area (Vmax): 1.28 cm^2. Valve area (Vmean): 1.33  cm^2.  - Mitral valve: Mildly thickened leaflets . There was mild  regurgitation.  - Left atrium: The atrium was mildly dilated.  - Right atrium: The atrium was mildly dilated. Central venous  pressure (est): 8 mm Hg.  - Tricuspid valve: There was trivial regurgitation.  - Pulmonary arteries: PA peak pressure: 36 mm Hg (S).  - Pericardium, extracardiac: There was no pericardial effusion.   Labs/Other Tests and Data Reviewed:    EKG:  An ECG dated 09/22/2017 was personally reviewed today and demonstrated:  sinus bradycardia, HR 57 with LVH.   Recent Labs: No results found for requested labs within last 8760 hours.   Recent Lipid Panel Lab Results  Component Value Date/Time   CHOL 147 06/21/2016 10:46 AM   TRIG 138 06/21/2016 10:46 AM   HDL 38 (L) 06/21/2016 10:46 AM   CHOLHDL 3.9 06/21/2016 10:46 AM   LDLCALC 81 06/21/2016 10:46 AM    Wt Readings from Last 3 Encounters:  04/16/19 190 lb (86.2 kg)  09/22/17 180 lb (81.6 kg)  11/19/16 185 lb (83.9 kg)     Objective:    Vital Signs:  Ht 6\' 2"  (1.88 m)   Wt 190 lb (86.2 kg)   BMI 24.39 kg/m    General: Pleasant male sounding in NAD Psych: Normal affect. Neuro: Alert and oriented X  3. Lungs:  Resp regular and unlabored while talking on the phone.   ASSESSMENT & PLAN:    1. Aortic Stenosis/Bicuspid Aortic Valve - Echocardiogram in 06/2016 showed a likely functionally bicuspid aortic valve with moderate stenosis and moderate regurgitation.  - He is asymptomatic and denies any recent dyspnea on exertion, orthopnea, dizziness or presyncope. - Given the timeframe since his last study, will plan to obtain a repeat echocardiogram for reassessment.  2. Tobacco Use - previously smoking 1 ppd, down to 0.75 ppd. Congratulated on this with continued reduction advised.    COVID-19 Education: The signs and symptoms of COVID-19 were discussed with the patient and how to seek care for testing (follow up with PCP or arrange E-visit).  The importance of social distancing was discussed today.  Time:   Today, I have spent 14 minutes with the patient with telehealth technology discussing the  above problems.     Medication Adjustments/Labs and Tests Ordered: Current medicines are reviewed at length with the patient today.  Concerns regarding medicines are outlined above.   Tests Ordered: Orders Placed This Encounter  Procedures  . ECHOCARDIOGRAM COMPLETE    Medication Changes: No orders of the defined types were placed in this encounter.   Follow Up:  In Person in 1 year(s)  Signed, Ellsworth Lennox, PA-C  04/16/2019 1:27 PM    [facility]

## 2019-04-16 ENCOUNTER — Encounter: Payer: Self-pay | Admitting: Student

## 2019-04-16 ENCOUNTER — Telehealth (INDEPENDENT_AMBULATORY_CARE_PROVIDER_SITE_OTHER): Payer: PRIVATE HEALTH INSURANCE | Admitting: Student

## 2019-04-16 VITALS — Ht 74.0 in | Wt 190.0 lb

## 2019-04-16 DIAGNOSIS — I350 Nonrheumatic aortic (valve) stenosis: Secondary | ICD-10-CM

## 2019-04-16 DIAGNOSIS — Z720 Tobacco use: Secondary | ICD-10-CM

## 2019-04-16 DIAGNOSIS — Q231 Congenital insufficiency of aortic valve: Secondary | ICD-10-CM

## 2019-04-16 NOTE — Patient Instructions (Signed)
Medication Instructions:  Your physician recommends that you continue on your current medications as directed. Please refer to the Current Medication list given to you today.  *If you need a refill on your cardiac medications before your next appointment, please call your pharmacy*   Lab Work: NONE   If you have labs (blood work) drawn today and your tests are completely normal, you will receive your results only by: . MyChart Message (if you have MyChart) OR . A paper copy in the mail If you have any lab test that is abnormal or we need to change your treatment, we will call you to review the results.   Testing/Procedures: Your physician has requested that you have an echocardiogram. Echocardiography is a painless test that uses sound waves to create images of your heart. It provides your doctor with information about the size and shape of your heart and how well your heart's chambers and valves are working. This procedure takes approximately one hour. There are no restrictions for this procedure.   Follow-Up: At [facility], you and your health needs are our priority.  As part of our continuing mission to provide you with exceptional heart care, we have created designated Provider Care Teams.  These Care Teams include your primary Cardiologist (physician) and Advanced Practice Providers (APPs -  Physician Assistants and Nurse Practitioners) who all work together to provide you with the care you need, when you need it.  We recommend signing up for the patient portal called "MyChart".  Sign up information is provided on this After Visit Summary.  MyChart is used to connect with patients for Virtual Visits (Telemedicine).  Patients are able to view lab/test results, encounter notes, upcoming appointments, etc.  Non-urgent messages can be sent to your provider as well.   To learn more about what you can do with MyChart, go to [email or web address].    Your next appointment:   1  year(s)  The format for your next appointment:   In Person  Provider:   Jonathan Branch, MD   Other Instructions Thank you for choosing [facility]!    

## 2019-04-23 ENCOUNTER — Ambulatory Visit (HOSPITAL_COMMUNITY)
Admission: RE | Admit: 2019-04-23 | Discharge: 2019-04-23 | Disposition: A | Discharge status: Home / Self Care | Payer: PRIVATE HEALTH INSURANCE | Source: Ambulatory Visit | Attending: Student | Admitting: Student

## 2019-04-23 ENCOUNTER — Other Ambulatory Visit: Payer: Self-pay

## 2019-04-23 DIAGNOSIS — I350 Nonrheumatic aortic (valve) stenosis: Secondary | ICD-10-CM | POA: Insufficient documentation

## 2019-04-23 NOTE — Progress Notes (Signed)
*  PRELIMINARY RESULTS* Echocardiogram 2D Echocardiogram has been performed.  Stacey Drain 04/23/2019, 2:13 PM

## 2020-02-23 ENCOUNTER — Encounter (HOSPITAL_COMMUNITY): Payer: Self-pay

## 2020-02-23 ENCOUNTER — Emergency Department (HOSPITAL_COMMUNITY): Payer: Managed Care, Other (non HMO)

## 2020-02-23 ENCOUNTER — Other Ambulatory Visit: Payer: Self-pay

## 2020-02-23 ENCOUNTER — Emergency Department (HOSPITAL_COMMUNITY)
Admission: EM | Admit: 2020-02-23 | Discharge: 2020-02-23 | Disposition: A | Discharge status: Home / Self Care | Payer: Managed Care, Other (non HMO) | Attending: Emergency Medicine | Admitting: Emergency Medicine

## 2020-02-23 DIAGNOSIS — Z5321 Procedure and treatment not carried out due to patient leaving prior to being seen by health care provider: Secondary | ICD-10-CM | POA: Insufficient documentation

## 2020-02-23 DIAGNOSIS — R0602 Shortness of breath: Secondary | ICD-10-CM | POA: Diagnosis not present

## 2020-02-23 DIAGNOSIS — R079 Chest pain, unspecified: Secondary | ICD-10-CM | POA: Diagnosis not present

## 2020-02-23 DIAGNOSIS — R197 Diarrhea, unspecified: Secondary | ICD-10-CM | POA: Diagnosis not present

## 2020-02-23 DIAGNOSIS — R531 Weakness: Secondary | ICD-10-CM | POA: Diagnosis not present

## 2020-02-23 LAB — BASIC METABOLIC PANEL
Anion gap: 11 (ref 5–15)
BUN: 11 mg/dL (ref 6–20)
CO2: 20 mmol/L — ABNORMAL LOW (ref 22–32)
Calcium: 9.5 mg/dL (ref 8.9–10.3)
Chloride: 106 mmol/L (ref 98–111)
Creatinine, Ser: 0.97 mg/dL (ref 0.61–1.24)
GFR, Estimated: >60 mL/min (ref >60)
Glucose, Bld: 82 mg/dL (ref 70–99)
Potassium: 3.9 mmol/L (ref 3.5–5.1)
Sodium: 137 mmol/L (ref 135–145)

## 2020-02-23 LAB — CBC
HCT: 43.7 % (ref 39.0–52.0)
Hemoglobin: 14.3 g/dL (ref 13.0–17.0)
MCH: 32.1 pg (ref 26.0–34.0)
MCHC: 32.7 g/dL (ref 30.0–36.0)
MCV: 98.2 fL (ref 80.0–100.0)
Platelets: 169 10*3/uL (ref 150–400)
RBC: 4.45 MIL/uL (ref 4.22–5.81)
RDW: 12.8 % (ref 11.5–15.5)
WBC: 4.9 10*3/uL (ref 4.0–10.5)
nRBC: 0 % (ref 0.0–0.2)

## 2020-02-23 LAB — TROPONIN I (HIGH SENSITIVITY)
Troponin I (High Sensitivity): 5 ng/L (ref <18)
Troponin I (High Sensitivity): 6 ng/L (ref <18)

## 2020-02-23 NOTE — ED Triage Notes (Signed)
Pt present to ED with complaints of left sided chest pain started yesterday, generalized weakness, SOB, diarrhea started Tuesday. Denies fever. Pt had one dose of Maderna vaccine.

## 2020-09-27 ENCOUNTER — Other Ambulatory Visit: Payer: Self-pay

## 2020-09-27 ENCOUNTER — Ambulatory Visit
Admission: EM | Admit: 2020-09-27 | Discharge: 2020-09-27 | Disposition: A | Discharge status: Home / Self Care | Payer: Managed Care, Other (non HMO) | Attending: Family Medicine | Admitting: Family Medicine

## 2020-09-27 DIAGNOSIS — L245 Irritant contact dermatitis due to other chemical products: Secondary | ICD-10-CM | POA: Diagnosis not present

## 2020-09-27 DIAGNOSIS — L720 Epidermal cyst: Secondary | ICD-10-CM

## 2020-09-27 MED ORDER — METHYLPREDNISOLONE SODIUM SUCC 125 MG IJ SOLR
125.0000 mg | Freq: Once | INTRAMUSCULAR | Status: AC
  Administered 2020-09-27: 125 mg via INTRAMUSCULAR

## 2020-09-27 MED ORDER — DOXYCYCLINE HYCLATE 100 MG PO CAPS: 100.0000 mg | ORAL_CAPSULE | Freq: Two times a day (BID) | ORAL | 0 refills | Status: DC

## 2020-09-27 MED ORDER — PREDNISONE 10 MG (48) PO TBPK: ORAL_TABLET | ORAL | 0 refills | Status: DC

## 2020-09-27 NOTE — ED Triage Notes (Signed)
Pt presents with rash on arms for past year, was told it was from detergent but was told by boss to come have evaluated, also has small abscess on back

## 2020-09-28 NOTE — ED Provider Notes (Signed)
[facility]   [id number] 09/27/20 Arrival Time: 1400  ASSESSMENT & PLAN:  1. Irritant contact dermatitis due to other chemical products   2. Epidermoid cyst of skin of back    Declines I&D of cyst on back. Trial of antibiotic. Steroids for contact dermatitis. No signs of skin infections.  Meds ordered this encounter  Medications   methylPREDNISolone sodium succinate (SOLU-MEDROL) 125 mg/2 mL injection 125 mg   predniSONE (STERAPRED UNI-PAK 48 TAB) 10 MG (48) TBPK tablet    Sig: Take as directed.    Dispense:  48 tablet    Refill:  0   doxycycline (VIBRAMYCIN) 100 MG capsule    Sig: Take 1 capsule (100 mg total) by mouth 2 (two) times daily.    Dispense:  14 capsule    Refill:  0    Will follow up with PCP or here if worsening or failing to improve as anticipated. Reviewed expectations re: course of current medical issues. Questions answered. Outlined signs and symptoms indicating need for more acute intervention. Patient verbalized understanding. After Visit Summary given.   SUBJECTIVE:  Isaac Diaz is a 55 y.o. male who presents with a skin complaint. Reaction to concrete exposure; past week. Mainly on upper arms. Significant itching. Also "big lump" on his upper back; unsure of duration; recent increase in size and pain; no drainage or bleeding. Afebrile.    OBJECTIVE: Vitals:   09/27/20 1437  BP: (!) 147/73  Pulse: 68  Resp: 20  Temp: 98.1 F (36.7 C)  SpO2: 98%    General appearance: alert; no distress HEENT: Isaac Diaz; AT Neck: supple with FROM Lungs: clear to auscultation bilaterally Extremities: no edema; moves all extremities normally Skin: warm and dry; contact derm of distal forearms; approx 1.5 cm epidermoid cyst of upper mid back; is fluctuant; mild overlying erythema Psychological: alert and cooperative; normal mood and affect  Allergies  Allergen Reactions   Other Anaphylaxis    Feathers (pillow stuffing)   Chocolate Hives     Past Medical History:  Diagnosis Date   Allergy    Enlarged prostate    Heart murmur    Social History   Socioeconomic History   Marital status: Legally Separated    Spouse name: Not on file   Number of children: 6   Years of education: 12   Highest education level: Not on file  Occupational History   Occupation: concrete pre cast  Tobacco Use   Smoking status: Every Day    Packs/day: 0.50    Years: 28.00    Pack years: 14.00    Types: Cigarettes    Start date: 02/12/1988   Smokeless tobacco: Never  Vaping Use   Vaping Use: Never used  Substance and Sexual Activity   Alcohol use: Yes    Comment:  weekly   Drug use: Yes    Types: Marijuana    Comment: rare   Sexual activity: Not Currently    Birth control/protection: None  Other Topics Concern   Not on file  Social History Narrative   Born in [place]   Worked in [place] for 17 years   Moved back Dec 2018   Lives with Sister, Isaac Diaz, Carver Fila   Family nearby   Social Determinants of Health   Financial Resource Strain: Not on file  Food Insecurity: Not on file  Transportation Needs: Not on file  Physical Activity: Not on file  Stress: Not on file  Social Connections: Not on file  Intimate Partner Violence:  Not on file   Family History  Problem Relation Age of Onset   Cancer Mother        stomach   Stroke Father 9   Aneurysm Sister        brain aneurysm   Cancer Maternal Grandfather        lung   Past Surgical History:  Procedure Laterality Date   AMPUTATION FINGER Right    fingertip right long      Mardella Layman, MD 09/28/20 1117

## 2020-10-12 ENCOUNTER — Ambulatory Visit (INDEPENDENT_AMBULATORY_CARE_PROVIDER_SITE_OTHER): Payer: Managed Care, Other (non HMO) | Admitting: Cardiology

## 2020-10-12 ENCOUNTER — Encounter: Payer: Self-pay | Admitting: Cardiology

## 2020-10-12 VITALS — BP 168/68 | HR 72 | Ht 74.0 in | Wt 175.0 lb

## 2020-10-12 DIAGNOSIS — I350 Nonrheumatic aortic (valve) stenosis: Secondary | ICD-10-CM | POA: Diagnosis not present

## 2020-10-12 DIAGNOSIS — Q231 Congenital insufficiency of aortic valve: Secondary | ICD-10-CM | POA: Diagnosis not present

## 2020-10-12 DIAGNOSIS — R030 Elevated blood-pressure reading, without diagnosis of hypertension: Secondary | ICD-10-CM | POA: Diagnosis not present

## 2020-10-12 NOTE — Patient Instructions (Signed)
Medication Instructions:  Your physician recommends that you continue on your current medications as directed. Please refer to the Current Medication list given to you today.  *If you need a refill on your cardiac medications before your next appointment, please call your pharmacy*   Lab Work: NONE   If you have labs (blood work) drawn today and your tests are completely normal, you will receive your results only by: MyChart Message (if you have MyChart) OR A paper copy in the mail If you have any lab test that is abnormal or we need to change your treatment, we will call you to review the results.   Testing/Procedures: Your physician has requested that you have an echocardiogram. Echocardiography is a painless test that uses sound waves to create images of your heart. It provides your doctor with information about the size and shape of your heart and how well your heart's chambers and valves are working. This procedure takes approximately one hour. There are no restrictions for this procedure.    Follow-Up: At [facility], you and your health needs are our priority.  As part of our continuing mission to provide you with exceptional heart care, we have created designated Provider Care Teams.  These Care Teams include your primary Cardiologist (physician) and Advanced Practice Providers (APPs -  Physician Assistants and Nurse Practitioners) who all work together to provide you with the care you need, when you need it.  We recommend signing up for the patient portal called "MyChart".  Sign up information is provided on this After Visit Summary.  MyChart is used to connect with patients for Virtual Visits (Telemedicine).  Patients are able to view lab/test results, encounter notes, upcoming appointments, etc.  Non-urgent messages can be sent to your provider as well.   To learn more about what you can do with MyChart, go to [email or web address].    Your next appointment:   6  month(s)  The format for your next appointment:   In Person  Provider:      Other Instructions Thank you for choosing [facility]!

## 2020-10-12 NOTE — Progress Notes (Signed)
Clinical Summary Isaac Diaz is a 55 y.o.male seen today for follow up of the following medical problems  1. Aortic stenosis/Bicuspid AV - echo 06/2016 LVEF 60-65%, normal diastolic function, moderate AS mean grad 32, AVA VTI 1.46, mod AI, mild MR     2018 CTA no aortopathy  04/2019 echo LVEF 60-65%, bicuspid AV, mod AS AVA VTI 1.46 mean grad 33 DI 0.35.    - no recent chest, no SOB, no presyncope/syncope - he reports his sons have been screened    2. Elevated bp - recently on prednsione, stopped on Friday     - SH: son with Tetrology of fallot with prior surgery Works at [employer] Past Medical History:  Diagnosis Date   Allergy    Enlarged prostate    Heart murmur      Allergies  Allergen Reactions   Other Anaphylaxis    Feathers (pillow stuffing)   Chocolate Hives     Current Outpatient Medications  Medication Sig Dispense Refill   doxycycline (VIBRAMYCIN) 100 MG capsule Take 1 capsule (100 mg total) by mouth 2 (two) times daily. 14 capsule 0   predniSONE (STERAPRED UNI-PAK 48 TAB) 10 MG (48) TBPK tablet Take as directed. 48 tablet 0   No current facility-administered medications for this visit.     Past Surgical History:  Procedure Laterality Date   AMPUTATION FINGER Right    fingertip right long     Allergies  Allergen Reactions   Other Anaphylaxis    Feathers (pillow stuffing)   Chocolate Hives      Family History  Problem Relation Age of Onset   Cancer Mother        stomach   Stroke Father 22   Aneurysm Sister        brain aneurysm   Cancer Maternal Grandfather        lung     Social History Isaac Diaz reports that he has been smoking cigarettes. He started smoking about 32 years ago. He has a 14.00 pack-year smoking history. He has never used smokeless tobacco. Isaac Diaz reports current alcohol use.   Review of Systems CONSTITUTIONAL: No weight loss, fever, chills, weakness or fatigue.  HEENT: Eyes: No  visual loss, blurred vision, double vision or yellow sclerae.No hearing loss, sneezing, congestion, runny nose or sore throat.  SKIN: No rash or itching.  CARDIOVASCULAR: per hpi RESPIRATORY: No shortness of breath, cough or sputum.  GASTROINTESTINAL: No anorexia, nausea, vomiting or diarrhea. No abdominal pain or blood.  GENITOURINARY: No burning on urination, no polyuria NEUROLOGICAL: No headache, dizziness, syncope, paralysis, ataxia, numbness or tingling in the extremities. No change in bowel or bladder control.  MUSCULOSKELETAL: No muscle, back pain, joint pain or stiffness.  LYMPHATICS: No enlarged nodes. No history of splenectomy.  PSYCHIATRIC: No history of depression or anxiety.  ENDOCRINOLOGIC: No reports of sweating, cold or heat intolerance. No polyuria or polydipsia.  Marland Kitchen   Physical Examination Today's Vitals   10/12/20 1013  BP: (!) 168/68  Pulse: 72  SpO2: 98%  Weight: 175 lb (79.4 kg)  Height: 6\' 2"  (1.88 m)   Body mass index is 22.47 kg/m.  Gen: resting comfortably, no acute distress HEENT: no scleral icterus, pupils equal round and reactive, no palptable cervical adenopathy,  CV: RRR, 3/6 systolic murmur rusb, no jvd Resp: Clear to auscultation bilaterally GI: abdomen is soft, non-tender, non-distended, normal bowel sounds, no hepatosplenomegaly MSK: extremities are warm, no edema.  Skin: warm, no rash Neuro:  no focal deficits Psych: appropriate affect   Diagnostic Studies  07/2016 CTA Study Result  Narrative & Impression  CLINICAL DATA:  Evaluate aorta for dissection or other abnormality.   EXAM: CT ANGIOGRAPHY CHEST, ABDOMEN AND PELVIS   TECHNIQUE: Multidetector CT imaging through the chest, abdomen and pelvis was performed using the standard protocol during bolus administration of intravenous contrast. Multiplanar reconstructed images and MIPs were obtained and reviewed to evaluate the vascular anatomy.   CONTRAST:  100 cc Isovue 370 IV    COMPARISON:  None.   FINDINGS: CTA CHEST FINDINGS   Cardiovascular: Maximum aortic diameter in the proximal ascending thoracic aorta at 3.7 cm. Proximal descending thoracic aorta 3.2 cm. Mild noncalcified atherosclerotic irregularity in the descending thoracic aorta. No dissection. Heart is upper limits normal in size. Calcifications at the aortic valve.   Mediastinum/Nodes: No mediastinal, hilar, or axillary adenopathy. Trachea and esophagus are unremarkable.   Lungs/Pleura: Lungs are clear. No focal airspace opacities or suspicious nodules. No effusions.   Musculoskeletal: No acute bony abnormality. Chest wall soft tissues are unremarkable.   Review of the MIP images confirms the above findings.   CTA ABDOMEN AND PELVIS FINDINGS   VASCULAR   Aorta: Mild atherosclerotic irregularity. No aneurysm. Maximum diameter in the proximal abdominal aorta at 2.5 cm. No dissection.   Celiac: Widely patent   SMA: Widely patent   Renals: Single bilaterally, widely patent   IMA: Widely patent   Inflow: Calcified and noncalcified plaque in the proximal common iliac arteries with mild stenosis in the proximal right common iliac artery and moderate stenosis in the proximal left common iliac artery. External iliac arteries and common femoral arteries are widely patent. No dissection.   Veins: Grossly unremarkable.   Review of the MIP images confirms the above findings.   NON-VASCULAR   Hepatobiliary: No focal hepatic abnormality. Gallbladder unremarkable.   Pancreas: No focal abnormality or ductal dilatation.   Spleen: No focal abnormality.  Normal size.   Adrenals/Urinary Tract: No adrenal abnormality. No focal renal abnormality. No stones or hydronephrosis. Urinary bladder is unremarkable.   Stomach/Bowel: Stomach, large and small bowel grossly unremarkable.   Lymphatic: No adenopathy.   Reproductive: No visible focal abnormality.   Other: No free fluid or free  air.   Musculoskeletal: No acute bony abnormality.   Review of the MIP images confirms the above findings.   IMPRESSION: Mild atherosclerotic irregularity throughout the descending thoracic and abdominal aorta. No evidence of aneurysm or dissection.   Aortic valve calcifications.   No acute findings in the chest, abdomen or pelvis.    04/2019 echo IMPRESSIONS     1. Left ventricular ejection fraction, by estimation, is 60 to 65%. The  left ventricle has normal function. The left ventricle has no regional  wall motion abnormalities. The left ventricular internal cavity size was  upper normal. There is mild left  ventricular hypertrophy. Left ventricular diastolic parameters were  normal.   2. Right ventricular systolic function is normal. The right ventricular  size is normal. There is normal pulmonary artery systolic pressure. The  estimated right ventricular systolic pressure is 21.8 mmHg.   3. Left atrial size was moderately dilated.   4. The mitral valve is grossly normal with mild annular calcification.  Mild mitral valve regurgitation.   5. The aortic valve is bicuspid. Aortic valve regurgitation is moderate.  Moderate aortic valve stenosis. Aortic valve area, by VTI measures 1.46  cm. Aortic valve mean gradient measures 33.6 mmHg. Aortic valve  Vmax  measures 3.88 m/s. Dimentionless index   0.35.   6. The inferior vena cava is normal in size with greater than 50%  respiratory variability, suggesting right atrial pressure of 3 mmHg.    Assessment and Plan  1. Aortic stenosi/Bicuspid AV - moderate AS by last echo, prior CTA without coexisting aortopathy - repeat echo for ongoing surveillance - no recent symptoms  2. Elevated bp - does not have diagnosis of HTN - elevated bp today, recently just completed course of prednisone for rash - will having a nursing visit with vitals check when he comes for his echo, if still hypertensive would start norvac.      F/u 6  months   Antoine Poche, M.D.

## 2020-10-31 ENCOUNTER — Ambulatory Visit (HOSPITAL_COMMUNITY): Admission: RE | Admit: 2020-10-31 | Payer: PRIVATE HEALTH INSURANCE | Source: Ambulatory Visit

## 2020-10-31 ENCOUNTER — Ambulatory Visit (INDEPENDENT_AMBULATORY_CARE_PROVIDER_SITE_OTHER): Payer: Managed Care, Other (non HMO) | Admitting: *Deleted

## 2020-10-31 ENCOUNTER — Other Ambulatory Visit: Payer: Self-pay

## 2020-10-31 VITALS — BP 140/60 | HR 56 | Ht 74.0 in

## 2020-10-31 DIAGNOSIS — Z0130 Encounter for examination of blood pressure without abnormal findings: Secondary | ICD-10-CM

## 2020-10-31 NOTE — Progress Notes (Signed)
Pt states that he feels "good" today.

## 2020-11-24 ENCOUNTER — Ambulatory Visit (HOSPITAL_COMMUNITY)
Admission: RE | Admit: 2020-11-24 | Discharge: 2020-11-24 | Disposition: A | Discharge status: Home / Self Care | Payer: Managed Care, Other (non HMO) | Source: Ambulatory Visit | Attending: Cardiology | Admitting: Cardiology

## 2020-11-24 ENCOUNTER — Other Ambulatory Visit: Payer: Self-pay

## 2020-11-24 DIAGNOSIS — I350 Nonrheumatic aortic (valve) stenosis: Secondary | ICD-10-CM | POA: Insufficient documentation

## 2020-11-24 DIAGNOSIS — Q231 Congenital insufficiency of aortic valve: Secondary | ICD-10-CM | POA: Insufficient documentation

## 2020-11-24 LAB — ECHOCARDIOGRAM COMPLETE
AR max vel: 1.02 cm2
AV Area VTI: 0.94 cm2
AV Area mean vel: 1 cm2
AV Mean grad: 43 mmHg
AV Peak grad: 79.2 mmHg
Ao pk vel: 4.45 m/s
Area-P 1/2: 3.03 cm2
P 1/2 time: 443 msec
S' Lateral: 3 cm

## 2020-11-24 NOTE — Progress Notes (Signed)
*  PRELIMINARY RESULTS* Echocardiogram 2D Echocardiogram has been performed.  Stacey Drain 11/24/2020, 4:11 PM

## 2020-12-07 ENCOUNTER — Telehealth: Payer: Self-pay | Admitting: Cardiology

## 2020-12-07 NOTE — Telephone Encounter (Signed)
Isaac Poche, MD  Roseanne Reno, CMA Echo shows aortic valve has worsened and now is in the severe range as far as stiffness. At our visit he denies any symptoms of shortness of breath or chest pains, as long as asymptomatic we just continue to monitor. If he were to have symptoms we would need to plan to replace the valve. Can he see [place] back in 3 months intead of 6 months, if any new shortness of breath or chest pains he needs to let us know   J BrancH MD

## 2020-12-07 NOTE — Telephone Encounter (Signed)
Isaac Diaz is returning Ashley's call in regards to his Echo results.

## 2020-12-07 NOTE — Telephone Encounter (Signed)
I spoke with Mr.Heidelberger and he denies CP or SOB. He will call us if symptoms change. Apt made with Dr.Branch on 03/09/21 at 1 pm.

## 2020-12-25 ENCOUNTER — Other Ambulatory Visit: Payer: Self-pay

## 2020-12-25 ENCOUNTER — Ambulatory Visit
Admission: EM | Admit: 2020-12-25 | Discharge: 2020-12-25 | Disposition: A | Discharge status: Home / Self Care | Payer: Managed Care, Other (non HMO) | Attending: Family Medicine | Admitting: Family Medicine

## 2020-12-25 DIAGNOSIS — R030 Elevated blood-pressure reading, without diagnosis of hypertension: Secondary | ICD-10-CM | POA: Diagnosis not present

## 2020-12-25 DIAGNOSIS — R220 Localized swelling, mass and lump, head: Secondary | ICD-10-CM

## 2020-12-25 DIAGNOSIS — K047 Periapical abscess without sinus: Secondary | ICD-10-CM | POA: Diagnosis not present

## 2020-12-25 MED ORDER — LIDOCAINE VISCOUS HCL 2 % MT SOLN: 10.0000 mL | OROMUCOSAL | 0 refills | Status: DC | PRN

## 2020-12-25 MED ORDER — KETOROLAC TROMETHAMINE 30 MG/ML IJ SOLN
30.0000 mg | Freq: Once | INTRAMUSCULAR | Status: AC
  Administered 2020-12-25: 30 mg via INTRAMUSCULAR

## 2020-12-25 MED ORDER — AMOXICILLIN-POT CLAVULANATE 875-125 MG PO TABS: 1.0000 | ORAL_TABLET | Freq: Two times a day (BID) | ORAL | 0 refills | Status: DC

## 2020-12-25 NOTE — ED Triage Notes (Signed)
Patient states he has had a tooth ache since last Friday. He states he has taken tylenol and Advil for pain. Last dose at 8 am this morning. Lower back tooth on the right side.

## 2020-12-25 NOTE — ED Provider Notes (Signed)
[facility]    CSN: [id number] Arrival date & time: 12/25/20  [id number]      History   Chief Complaint No chief complaint on file.   HPI Isaac Diaz is a 55 y.o. male.   Patient presenting today with right lower molar pain, swelling worsening x3 days.  States he is now having facial swelling and the pain is severe and debilitating.  He denies fever, dysphagia, bleeding or abscess in the mouth.  Has been trying Tylenol with minimal relief thus far.  Has a dental appointment next week but states he cannot last until then with the amount of pain he is in.   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 Authorizing Provider  amoxicillin-clavulanate (AUGMENTIN) 875-125 MG tablet Take 1 tablet by mouth every 12 (twelve) hours. 12/25/20  Yes Volney American, PA-C  lidocaine (XYLOCAINE) 2 % solution Use as directed 10 mLs in the mouth or throat every 3 (three) hours as needed for mouth pain. 12/25/20  Yes Volney American, PA-C  predniSONE (STERAPRED UNI-PAK 48 TAB) 10 MG (48) TBPK tablet Take as directed. Patient not taking: No sig reported 09/27/20   Vanessa Kick, MD    Family History Family History  Problem Relation Age of Onset   Cancer Mother        stomach   Stroke Father 29   Aneurysm Sister        brain aneurysm   Cancer Maternal Grandfather        lung    Social History Social History   Tobacco Use   Smoking status: Every Day    Packs/day: 0.50     Years: 28.00    Pack years: 14.00    Types: Cigarettes    Start date: 02/12/1988   Smokeless tobacco: Never  Vaping Use   Vaping Use: Never used  Substance Use Topics   Alcohol use: Yes    Comment:  weekly   Drug use: Yes    Types: Marijuana    Comment: rare     Allergies   Other and Chocolate   Review of Systems Review of Systems Per HPI  Physical Exam Triage Vital Signs ED Triage Vitals  Enc Vitals Group     BP 12/25/20 1305 (!) 228/63     Pulse --      Resp 12/25/20 1305 18     Temp 12/25/20 1305 (!) 97.2 F (36.2 C)     Temp Source 12/25/20 1305 Oral     SpO2 12/25/20 1305 99 %     Weight --      Height --      Head Circumference --      Peak Flow --      Pain Score 12/25/20 1307 10     Pain Loc --      Pain Edu? --      Excl. in [place]? --  No data found.  Updated Vital Signs BP (!) 228/63 (BP Location: Right Arm)   Temp (!) 97.2 F (36.2 C) (Oral)   Resp 18   SpO2 99%   Visual Acuity Right Eye Distance:   Left Eye Distance:   Bilateral Distance:    Right Eye Near:   Left Eye Near:    Bilateral Near:     Physical Exam Vitals and nursing note reviewed.  Constitutional:      Comments: Appears to be in a significant amount of pain, rocking back and forth and holding the area.  HENT:     Head: Atraumatic.     Comments: Right lower jaw facial swelling    Mouth/Throat:     Mouth: Mucous membranes are moist.     Comments: Right posterior molar with gingival erythema, edema, decay present in this tooth Eyes:     Extraocular Movements: Extraocular movements intact.     Conjunctiva/sclera: Conjunctivae normal.  Cardiovascular:     Rate and Rhythm: Normal rate and regular rhythm.  Pulmonary:     Effort: Pulmonary effort is normal.     Breath sounds: Normal breath sounds.  Musculoskeletal:        General: Normal range of motion.     Cervical back: Normal range of motion and neck supple.  Skin:    General: Skin is warm and dry.  Neurological:      General: No focal deficit present.     Mental Status: He is oriented to person, place, and time.  Psychiatric:        Mood and Affect: Mood normal.        Thought Content: Thought content normal.        Judgment: Judgment normal.     UC Treatments / Results  Labs (all labs ordered are listed, but only abnormal results are displayed) Labs Reviewed - No data to display  EKG   Radiology No results found.  Procedures Procedures (including critical care time)  Medications Ordered in UC Medications  ketorolac (TORADOL) 30 MG/ML injection 30 mg (has no administration in time range)    Initial Impression / Assessment and Plan / UC Course  I have reviewed the triage vital signs and the nursing notes.  Pertinent labs & imaging results that were available during my care of the patient were reviewed by me and considered in my medical decision making (see chart for details).     IM Toradol given today for pain, discussed Augmentin, viscous lidocaine, salt water gargles and close dental follow-up as scheduled.  Return for acutely worsening symptoms.  Also discussed his significantly elevated blood pressure reading likely secondary to pain.  He should monitor this closely over the next few days and follow-up if having any chest pain, shortness of breath, dizziness, mental status changes.  Final Clinical Impressions(s) / UC Diagnoses   Final diagnoses:  Dental infection  Facial swelling  Elevated blood pressure reading   Discharge Instructions   None    ED Prescriptions     Medication Sig Dispense Auth. Provider   amoxicillin-clavulanate (AUGMENTIN) 875-125 MG tablet Take 1 tablet by mouth every 12 (twelve) hours. 14 tablet Particia Nearing, [place]   lidocaine (XYLOCAINE) 2 % solution Use as directed 10 mLs in the mouth or throat every 3 (three) hours as needed for mouth pain. 100 mL Particia Nearing, [place]      PDMP not reviewed this encounter.   Particia Nearing, [place] 12/25/20 1321

## 2021-03-09 ENCOUNTER — Other Ambulatory Visit: Payer: Self-pay | Admitting: Cardiology

## 2021-03-09 ENCOUNTER — Encounter: Payer: Self-pay | Admitting: Cardiology

## 2021-03-09 ENCOUNTER — Ambulatory Visit (INDEPENDENT_AMBULATORY_CARE_PROVIDER_SITE_OTHER): Payer: Managed Care, Other (non HMO) | Admitting: Cardiology

## 2021-03-09 VITALS — BP 152/74 | HR 68 | Ht 74.0 in | Wt 181.0 lb

## 2021-03-09 DIAGNOSIS — I350 Nonrheumatic aortic (valve) stenosis: Secondary | ICD-10-CM

## 2021-03-09 DIAGNOSIS — I10 Essential (primary) hypertension: Secondary | ICD-10-CM | POA: Diagnosis not present

## 2021-03-09 MED ORDER — SODIUM CHLORIDE 0.9% FLUSH: 3.0000 mL | Freq: Two times a day (BID) | INTRAVENOUS | Status: AC

## 2021-03-09 MED ORDER — AMLODIPINE BESYLATE 5 MG PO TABS: 5.0000 mg | ORAL_TABLET | Freq: Every day | ORAL | 3 refills | Status: DC

## 2021-03-09 NOTE — H&P (View-Only) (Signed)
Clinical Summary Isaac Diaz is a 56 y.o.male seen today for follow up of the following medical problems   1. Aortic stenosis/Bicuspid AV - echo 06/2016 LVEF 60-65%, normal diastolic function, moderate AS mean grad 32, AVA VTI 1.46, mod AI, mild MR       2018 CTA no aortopathy  04/2019 echo LVEF 60-65%, bicuspid AV, mod AS AVA VTI 1.46 mean grad 33 DI 0.35.     11/2020 echo: LVEF  60-65%. AVA VTI 0.94 mean grad 43 DI 0.30  - recent SOB/DOE with just walking the trash cans the curb, something he has done for years without prior symptoms - DOE walking through the store.     2. Elevated bp - noted last visit, was on course of prednisone so just monitored at the time.        - SH: son with Tetrology of fallot with prior surgery Works  concrete/construction   Past Medical History:  Diagnosis Date   Allergy    Enlarged prostate    Heart murmur      Allergies  Allergen Reactions   Other Anaphylaxis    Feathers (pillow stuffing)   Chocolate Hives     Current Outpatient Medications  Medication Sig Dispense Refill   amoxicillin-clavulanate (AUGMENTIN) 875-125 MG tablet Take 1 tablet by mouth every 12 (twelve) hours. 14 tablet 0   lidocaine (XYLOCAINE) 2 % solution Use as directed 10 mLs in the mouth or throat every 3 (three) hours as needed for mouth pain. 100 mL 0   predniSONE (STERAPRED UNI-PAK 48 TAB) 10 MG (48) TBPK tablet Take as directed. (Patient not taking: No sig reported) 48 tablet 0   No current facility-administered medications for this visit.     Past Surgical History:  Procedure Laterality Date   AMPUTATION FINGER Right    fingertip right long     Allergies  Allergen Reactions   Other Anaphylaxis    Feathers (pillow stuffing)   Chocolate Hives      Family History  Problem Relation Age of Onset   Cancer Mother        stomach   Stroke Father 65   Aneurysm Sister        brain aneurysm   Cancer Maternal Grandfather        lung      Social History Isaac Diaz reports that he has been smoking cigarettes. He started smoking about 33 years ago. He has a 14.00 pack-year smoking history. He has never used smokeless tobacco. Isaac Diaz reports current alcohol use.   Review of Systems CONSTITUTIONAL: No weight loss, fever, chills, weakness or fatigue.  HEENT: Eyes: No visual loss, blurred vision, double vision or yellow sclerae.No hearing loss, sneezing, congestion, runny nose or sore throat.  SKIN: No rash or itching.  CARDIOVASCULAR: per hpi RESPIRATORY: per hpi GASTROINTESTINAL: No anorexia, nausea, vomiting or diarrhea. No abdominal pain or blood.  GENITOURINARY: No burning on urination, no polyuria NEUROLOGICAL: No headache, dizziness, syncope, paralysis, ataxia, numbness or tingling in the extremities. No change in bowel or bladder control.  MUSCULOSKELETAL: No muscle, back pain, joint pain or stiffness.  LYMPHATICS: No enlarged nodes. No history of splenectomy.  PSYCHIATRIC: No history of depression or anxiety.  ENDOCRINOLOGIC: No reports of sweating, cold or heat intolerance. No polyuria or polydipsia.  Marland Kitchen   Physical Examination Today's Vitals   03/09/21 1240  BP: (!) 152/74  Pulse: 68  SpO2: 97%  Weight: 181 lb (82.1 kg)  Height: 6'  2" (1.88 m)   Body mass index is 23.24 kg/m.  Gen: resting comfortably, no acute distress HEENT: no scleral icterus, pupils equal round and reactive, no palptable cervical adenopathy,  CV: RRR, 3/6 systolic murmur rusb, no jvd Resp: Clear to auscultation bilaterally GI: abdomen is soft, non-tender, non-distended, normal bowel sounds, no hepatosplenomegaly MSK: extremities are warm, no edema.  Skin: warm, no rash Neuro:  no focal deficits Psych: appropriate affect   Diagnostic Studies  11/2020 echo  1. AoV appears bicuspid with fusion of the RCC/NCC. There is severe  aortic stenosis. Vmax 4.4 m/s, MG 43 mmHG, AVA 0.94 cm2, DI 0.30. Due to  mild to moderate  AI, DI is inaccurate and AVA is overestimated (LVOT VTI  31 cm). Findings are still consistent  with severe AS and mild to moderate AI. The aortic valve is bicuspid.  Aortic valve regurgitation is mild to moderate. Severe aortic valve  stenosis. Aortic valve area, by VTI measures 0.94 cm. Aortic valve mean  gradient measures 43.0 mmHg. Aortic valve  Vmax measures 4.45 m/s.   2. Left ventricular ejection fraction, by estimation, is 60 to 65%. The  left ventricle has normal function. The left ventricle has no regional  wall motion abnormalities. There is mild concentric left ventricular  hypertrophy. Left ventricular diastolic  parameters were normal.   3. Right ventricular systolic function is normal. The right ventricular  size is normal. Tricuspid regurgitation signal is inadequate for assessing  PA pressure.   4. Left atrial size was mildly dilated.   5. The mitral valve is grossly normal. No evidence of mitral valve  regurgitation. No evidence of mitral stenosis.   6. The inferior vena cava is normal in size with greater than 50%  respiratory variability, suggesting right atrial pressure of 3 mmHg.    Assessment and Plan  Severe aortic stenosis - AS has progressed by echo into severe range. Patient with recent onset of symptoms. DOE just walking at the store or taking the trash cans to the curb, things he previously did without any issues - will plan for RHC/LHC with either Dr Clifton James or Dr Excell Seltzer and subsequent evaluation for possible valve replacement  2. HTN - new diagnosis, persistently elevated bp over multiple visits - will start norvac 5mg  daily, likely will need titration over time   Shared Decision Making/Informed Consent{  The risks [stroke (1 in 1000), death (1 in 1000), kidney failure [usually temporary] (1 in 500), bleeding (1 in 200), allergic reaction [possibly serious] (1 in 200)], benefits (diagnostic support and management of coronary artery disease) and  alternatives of a cardiac catheterization were discussed in detail with Isaac Diaz and he is willing to proceed.     F/u 2 months      Jim Like, M.D.

## 2021-03-09 NOTE — Patient Instructions (Signed)
Medication Instructions:  Your physician has recommended you make the following change in your medication:  START Norvasc 5 mg tablets daily  *If you need a refill on your cardiac medications before your next appointment, please call your pharmacy*  Testing/Procedures: Cardiac Cath   Follow-Up: At [facility], you and your health needs are our priority.  As part of our continuing mission to provide you with exceptional heart care, we have created designated Provider Care Teams.  These Care Teams include your primary Cardiologist (physician) and Advanced Practice Providers (APPs -  Physician Assistants and Nurse Practitioners) who all work together to provide you with the care you need, when you need it.  We recommend signing up for the patient portal called "MyChart".  Sign up information is provided on this After Visit Summary.  MyChart is used to connect with patients for Virtual Visits (Telemedicine).  Patients are able to view lab/test results, encounter notes, upcoming appointments, etc.  Non-urgent messages can be sent to your provider as well.   To learn more about what you can do with MyChart, go to [email or web address].    Your next appointment:   2 month(s)  The format for your next appointment:   In Person  Provider:   Dina Rich, MD    Other Instructions  [facility] CARDIOVASCULAR DIVISION [facility] [address] [place] [address] Dept: [phone] Loc: [phone]  ABHINAV MAYORQUIN  03/09/2021  You are scheduled for a Cardiac Catheterization on Friday, February 3 with Dr. Verne Carrow.  1. Please arrive at the [facility] (Main Entrance A) at [facility]: [address] [place], [place] [address] at 10:00 AM (This time is two hours before your procedure to ensure your preparation). Free valet parking service is available.   Special note: Every effort is made to have your procedure done on  time. Please understand that emergencies sometimes delay scheduled procedures.  2. Diet: Do not eat solid foods after midnight.  The patient may have clear liquids until 5am upon the day of the procedure.  3. Labs: You do not need lab work completed.  4. Medication instructions in preparation for your procedure:   Contrast Allergy: No  On the morning of your procedure, take your Aspirin 81 mg tablet and any morning medicines NOT listed above.  You may use sips of water.  5. Plan for one night stay--bring personal belongings. 6. Bring a current list of your medications and current insurance cards. 7. You MUST have a responsible person to drive you home. 8. Someone MUST be with you the first 24 hours after you arrive home or your discharge will be delayed. 9. Please wear clothes that are easy to get on and off and wear slip-on shoes.  Thank you for allowing [place] to care for you!   -- [facility]

## 2021-03-09 NOTE — Progress Notes (Signed)
° ° ° °Clinical Summary °[name] is a 55 y.o.male seen today for follow up of the following medical problems °  °1. Aortic stenosis/Bicuspid AV °- echo 06/2016 LVEF 60-65%, normal diastolic function, moderate AS mean grad 32, AVA VTI 1.46, mod AI, mild MR °  °  °  °2018 CTA no aortopathy  °04/2019 echo LVEF 60-65%, bicuspid AV, mod AS AVA VTI 1.46 mean grad 33 DI 0.35.  °  ° 11/2020 echo: LVEF  60-65%. AVA VTI 0.94 mean grad 43 DI 0.30 ° °- recent SOB/DOE with just walking the trash cans the curb, something he has done for years without prior symptoms °- DOE walking through the store.  ° °  °2. Elevated bp °- noted last visit, was on course of prednisone so just monitored at the time.  °  °  °  °- SH: son with Tetrology of fallot with prior surgery °Works  concrete/construction ° ° °Past Medical History:  °Diagnosis Date  ° Allergy   ° Enlarged prostate   ° Heart murmur   ° ° ° °Allergies  °Allergen Reactions  ° Other Anaphylaxis  °  Feathers (pillow stuffing)  ° Chocolate Hives  ° ° ° °Current Outpatient Medications  °Medication Sig Dispense Refill  ° amoxicillin-clavulanate (AUGMENTIN) 875-125 MG tablet Take 1 tablet by mouth every 12 (twelve) hours. 14 tablet 0  ° lidocaine (XYLOCAINE) 2 % solution Use as directed 10 mLs in the mouth or throat every 3 (three) hours as needed for mouth pain. 100 mL 0  ° predniSONE (STERAPRED UNI-PAK 48 TAB) 10 MG (48) TBPK tablet Take as directed. (Patient not taking: No sig reported) 48 tablet 0  ° °No current facility-administered medications for this visit.  ° ° ° °Past Surgical History:  °Procedure Laterality Date  ° AMPUTATION FINGER Right   ° fingertip right long  ° ° ° °Allergies  °Allergen Reactions  ° Other Anaphylaxis  °  Feathers (pillow stuffing)  ° Chocolate Hives  ° ° ° ° °Family History  °Problem Relation Age of Onset  ° Cancer Mother   °     stomach  ° Stroke Father 55  ° Aneurysm Sister   °     brain aneurysm  ° Cancer Maternal Grandfather   °     lung   ° ° ° °Social History °[name] reports that he has been smoking cigarettes. He started smoking about 33 years ago. He has a 14.00 pack-year smoking history. He has never used smokeless tobacco. °[name] reports current alcohol use. ° ° °Review of Systems °CONSTITUTIONAL: No weight loss, fever, chills, weakness or fatigue.  °HEENT: Eyes: No visual loss, blurred vision, double vision or yellow sclerae.No hearing loss, sneezing, congestion, runny nose or sore throat.  °SKIN: No rash or itching.  °CARDIOVASCULAR: per hpi °RESPIRATORY: per hpi °GASTROINTESTINAL: No anorexia, nausea, vomiting or diarrhea. No abdominal pain or blood.  °GENITOURINARY: No burning on urination, no polyuria °NEUROLOGICAL: No headache, dizziness, syncope, paralysis, ataxia, numbness or tingling in the extremities. No change in bowel or bladder control.  °MUSCULOSKELETAL: No muscle, back pain, joint pain or stiffness.  °LYMPHATICS: No enlarged nodes. No history of splenectomy.  °PSYCHIATRIC: No history of depression or anxiety.  °ENDOCRINOLOGIC: No reports of sweating, cold or heat intolerance. No polyuria or polydipsia.  °. ° ° °Physical Examination °Today's Vitals  ° 03/09/21 1240  °BP: (!) 152/74  °Pulse: 68  °SpO2: 97%  °Weight: 181 lb (82.1 kg)  °Height: 6'   2" (1.88 m)   Body mass index is 23.24 kg/m.  Gen: resting comfortably, no acute distress HEENT: no scleral icterus, pupils equal round and reactive, no palptable cervical adenopathy,  CV: RRR, 3/6 systolic murmur rusb, no jvd Resp: Clear to auscultation bilaterally GI: abdomen is soft, non-tender, non-distended, normal bowel sounds, no hepatosplenomegaly MSK: extremities are warm, no edema.  Skin: warm, no rash Neuro:  no focal deficits Psych: appropriate affect   Diagnostic Studies  11/2020 echo  1. AoV appears bicuspid with fusion of the RCC/NCC. There is severe  aortic stenosis. Vmax 4.4 m/s, MG 43 mmHG, AVA 0.94 cm2, DI 0.30. Due to  mild to moderate  AI, DI is inaccurate and AVA is overestimated (LVOT VTI  31 cm). Findings are still consistent  with severe AS and mild to moderate AI. The aortic valve is bicuspid.  Aortic valve regurgitation is mild to moderate. Severe aortic valve  stenosis. Aortic valve area, by VTI measures 0.94 cm. Aortic valve mean  gradient measures 43.0 mmHg. Aortic valve  Vmax measures 4.45 m/s.   2. Left ventricular ejection fraction, by estimation, is 60 to 65%. The  left ventricle has normal function. The left ventricle has no regional  wall motion abnormalities. There is mild concentric left ventricular  hypertrophy. Left ventricular diastolic  parameters were normal.   3. Right ventricular systolic function is normal. The right ventricular  size is normal. Tricuspid regurgitation signal is inadequate for assessing  PA pressure.   4. Left atrial size was mildly dilated.   5. The mitral valve is grossly normal. No evidence of mitral valve  regurgitation. No evidence of mitral stenosis.   6. The inferior vena cava is normal in size with greater than 50%  respiratory variability, suggesting right atrial pressure of 3 mmHg.    Assessment and Plan  Severe aortic stenosis - AS has progressed by echo into severe range. Patient with recent onset of symptoms. DOE just walking at the store or taking the trash cans to the curb, things he previously did without any issues - will plan for RHC/LHC with either Dr Clifton James or Dr Excell Seltzer and subsequent evaluation for possible valve replacement  2. HTN - new diagnosis, persistently elevated bp over multiple visits - will start norvac 5mg  daily, likely will need titration over time   Shared Decision Making/Informed Consent{  The risks [stroke (1 in 1000), death (1 in 1000), kidney failure [usually temporary] (1 in 500), bleeding (1 in 200), allergic reaction [possibly serious] (1 in 200)], benefits (diagnostic support and management of coronary artery disease) and  alternatives of a cardiac catheterization were discussed in detail with [name] and he is willing to proceed.     F/u 2 months      Jim Like, M.D.

## 2021-03-12 ENCOUNTER — Other Ambulatory Visit: Payer: Self-pay

## 2021-03-12 DIAGNOSIS — Z01818 Encounter for other preprocedural examination: Secondary | ICD-10-CM

## 2021-03-13 ENCOUNTER — Telehealth: Payer: Self-pay | Admitting: Cardiology

## 2021-03-13 ENCOUNTER — Other Ambulatory Visit (HOSPITAL_COMMUNITY)
Admission: RE | Admit: 2021-03-13 | Discharge: 2021-03-13 | Disposition: A | Discharge status: Home / Self Care | Payer: Managed Care, Other (non HMO) | Source: Ambulatory Visit | Attending: Cardiology | Admitting: Cardiology

## 2021-03-13 ENCOUNTER — Other Ambulatory Visit: Payer: Self-pay

## 2021-03-13 DIAGNOSIS — Z01818 Encounter for other preprocedural examination: Secondary | ICD-10-CM | POA: Diagnosis not present

## 2021-03-13 LAB — BASIC METABOLIC PANEL
Anion gap: 6 (ref 5–15)
BUN: 9 mg/dL (ref 6–20)
CO2: 23 mmol/L (ref 22–32)
Calcium: 9.3 mg/dL (ref 8.9–10.3)
Chloride: 107 mmol/L (ref 98–111)
Creatinine, Ser: 0.79 mg/dL (ref 0.61–1.24)
GFR, Estimated: >60 mL/min (ref >60)
Glucose, Bld: 97 mg/dL (ref 70–99)
Potassium: 4.1 mmol/L (ref 3.5–5.1)
Sodium: 136 mmol/L (ref 135–145)

## 2021-03-13 LAB — CBC
HCT: 42.5 % (ref 39.0–52.0)
Hemoglobin: 14.3 g/dL (ref 13.0–17.0)
MCH: 33.5 pg (ref 26.0–34.0)
MCHC: 33.6 g/dL (ref 30.0–36.0)
MCV: 99.5 fL (ref 80.0–100.0)
Platelets: 144 10*3/uL — ABNORMAL LOW (ref 150–400)
RBC: 4.27 MIL/uL (ref 4.22–5.81)
RDW: 13.2 % (ref 11.5–15.5)
WBC: 6.1 10*3/uL (ref 4.0–10.5)
nRBC: 0 % (ref 0.0–0.2)

## 2021-03-13 NOTE — Telephone Encounter (Signed)
PT stated that he was having headache with his blood pressure medication wanted to know if that was a normal side effect.

## 2021-03-13 NOTE — Telephone Encounter (Signed)
Patient started amlodipine 5 mg yesterday. Today he has a headache.He has not checked his BP at home but will do so and call us back.I told him I don't believe one dose of amlodipine would be causing HA. He denies sore throat,fever. He is currently getting his lab work done but will call us back with BP reading

## 2021-03-15 ENCOUNTER — Telehealth: Payer: Self-pay | Admitting: *Deleted

## 2021-03-15 NOTE — Telephone Encounter (Signed)
Cardiac catheterization scheduled at [facility] for: Friday March 16, 2021 12 Noon Arrive [facility] Main Entrance A [facility]) at: 10 AM   Diet-no solid food after midnight prior to cath, clear liquids until 5 AM day of procedure.  Medication instructions for procedure: -Usual morning medications can be taken pre-cath with sips of water including aspirin 81 mg.    Must have responsible adult to drive home post procedure and be with patient first 24 hours after arriving home.  [facility] does allow one visitor to accompany you and wait in the hospital waiting room while you are there for your procedure. You and your visitor will be asked to wear a mask once you enter the hospital.   Patient reports does not currently have any new symptoms concerning for COVID-19 and no household members with COVID-19 like illness.     Reviewed instructions with patient.

## 2021-03-16 ENCOUNTER — Encounter (HOSPITAL_COMMUNITY)
Admission: RE | Disposition: A | Discharge status: Home / Self Care | Payer: Self-pay | Source: Home / Self Care | Attending: Cardiovascular Disease

## 2021-03-16 ENCOUNTER — Ambulatory Visit (HOSPITAL_COMMUNITY)
Admission: RE | Admit: 2021-03-16 | Discharge: 2021-03-16 | Disposition: A | Discharge status: Home / Self Care | Payer: Managed Care, Other (non HMO) | Attending: Cardiovascular Disease | Admitting: Cardiovascular Disease

## 2021-03-16 ENCOUNTER — Other Ambulatory Visit: Payer: Self-pay

## 2021-03-16 DIAGNOSIS — F1090 Alcohol use, unspecified, uncomplicated: Secondary | ICD-10-CM | POA: Insufficient documentation

## 2021-03-16 DIAGNOSIS — F17210 Nicotine dependence, cigarettes, uncomplicated: Secondary | ICD-10-CM | POA: Insufficient documentation

## 2021-03-16 DIAGNOSIS — I350 Nonrheumatic aortic (valve) stenosis: Secondary | ICD-10-CM | POA: Insufficient documentation

## 2021-03-16 DIAGNOSIS — I10 Essential (primary) hypertension: Secondary | ICD-10-CM | POA: Diagnosis not present

## 2021-03-16 HISTORY — PX: RIGHT HEART CATH AND CORONARY ANGIOGRAPHY: CATH118264

## 2021-03-16 LAB — POCT I-STAT 7, (LYTES, BLD GAS, ICA,H+H)
Acid-base deficit: 2 mmol/L (ref 0.0–2.0)
Bicarbonate: 21.8 mmol/L (ref 20.0–28.0)
Calcium, Ion: 1.12 mmol/L — ABNORMAL LOW (ref 1.15–1.40)
HCT: 38 % — ABNORMAL LOW (ref 39.0–52.0)
Hemoglobin: 12.9 g/dL — ABNORMAL LOW (ref 13.0–17.0)
O2 Saturation: 99 %
Potassium: 3.7 mmol/L (ref 3.5–5.1)
Sodium: 142 mmol/L (ref 135–145)
TCO2: 23 mmol/L (ref 22–32)
pCO2 arterial: 34.3 mmHg (ref 32.0–48.0)
pH, Arterial: 7.41 (ref 7.350–7.450)
pO2, Arterial: 135 mmHg — ABNORMAL HIGH (ref 83.0–108.0)

## 2021-03-16 LAB — POCT I-STAT EG7
Acid-Base Excess: 0 mmol/L (ref 0.0–2.0)
Bicarbonate: 24.9 mmol/L (ref 20.0–28.0)
Calcium, Ion: 1.26 mmol/L (ref 1.15–1.40)
HCT: 40 % (ref 39.0–52.0)
Hemoglobin: 13.6 g/dL (ref 13.0–17.0)
O2 Saturation: 71 %
Potassium: 4.1 mmol/L (ref 3.5–5.1)
Sodium: 140 mmol/L (ref 135–145)
TCO2: 26 mmol/L (ref 22–32)
pCO2, Ven: 42.2 mmHg — ABNORMAL LOW (ref 44.0–60.0)
pH, Ven: 7.378 (ref 7.250–7.430)
pO2, Ven: 38 mmHg (ref 32.0–45.0)

## 2021-03-16 SURGERY — RIGHT HEART CATH AND CORONARY ANGIOGRAPHY
Anesthesia: LOCAL

## 2021-03-16 MED ORDER — VERAPAMIL HCL 2.5 MG/ML IV SOLN
INTRAVENOUS | Status: DC | PRN
  Administered 2021-03-16: 10 mL via INTRA_ARTERIAL

## 2021-03-16 MED ORDER — SODIUM CHLORIDE 0.9% FLUSH: 3.0000 mL | Freq: Two times a day (BID) | INTRAVENOUS | Status: DC

## 2021-03-16 MED ORDER — HEPARIN (PORCINE) IN NACL 1000-0.9 UT/500ML-% IV SOLN
INTRAVENOUS | Status: AC
  Filled 2021-03-16: qty 500

## 2021-03-16 MED ORDER — SODIUM CHLORIDE 0.9 % IV SOLN: 250.0000 mL | INTRAVENOUS | Status: DC | PRN

## 2021-03-16 MED ORDER — SODIUM CHLORIDE 0.9% FLUSH: 3.0000 mL | INTRAVENOUS | Status: DC | PRN

## 2021-03-16 MED ORDER — MIDAZOLAM HCL 2 MG/2ML IJ SOLN
INTRAMUSCULAR | Status: AC
  Filled 2021-03-16: qty 2

## 2021-03-16 MED ORDER — SODIUM CHLORIDE 0.9 % WEIGHT BASED INFUSION
3.0000 mL/kg/h | INTRAVENOUS | Status: AC
  Administered 2021-03-16: 3 mL/kg/h via INTRAVENOUS

## 2021-03-16 MED ORDER — SODIUM CHLORIDE 0.9 % IV SOLN: INTRAVENOUS | Status: AC

## 2021-03-16 MED ORDER — IOHEXOL 350 MG/ML SOLN
INTRAVENOUS | Status: DC | PRN
  Administered 2021-03-16: 40 mL via INTRA_ARTERIAL

## 2021-03-16 MED ORDER — LIDOCAINE HCL (PF) 1 % IJ SOLN
INTRAMUSCULAR | Status: AC
  Filled 2021-03-16: qty 30

## 2021-03-16 MED ORDER — HEPARIN SODIUM (PORCINE) 1000 UNIT/ML IJ SOLN
INTRAMUSCULAR | Status: DC | PRN
  Administered 2021-03-16: 4000 [IU] via INTRAVENOUS

## 2021-03-16 MED ORDER — MIDAZOLAM HCL 2 MG/2ML IJ SOLN
INTRAMUSCULAR | Status: DC | PRN
  Administered 2021-03-16: 2 mg via INTRAVENOUS

## 2021-03-16 MED ORDER — ASPIRIN 81 MG PO CHEW: 81.0000 mg | CHEWABLE_TABLET | ORAL | Status: DC

## 2021-03-16 MED ORDER — VERAPAMIL HCL 2.5 MG/ML IV SOLN
INTRAVENOUS | Status: AC
  Filled 2021-03-16: qty 2

## 2021-03-16 MED ORDER — FENTANYL CITRATE (PF) 100 MCG/2ML IJ SOLN
INTRAMUSCULAR | Status: DC | PRN
  Administered 2021-03-16: 50 ug via INTRAVENOUS

## 2021-03-16 MED ORDER — HEPARIN SODIUM (PORCINE) 1000 UNIT/ML IJ SOLN
INTRAMUSCULAR | Status: AC
  Filled 2021-03-16: qty 10

## 2021-03-16 MED ORDER — ACETAMINOPHEN 325 MG PO TABS: 650.0000 mg | ORAL_TABLET | ORAL | Status: DC | PRN

## 2021-03-16 MED ORDER — HEPARIN (PORCINE) IN NACL 1000-0.9 UT/500ML-% IV SOLN
INTRAVENOUS | Status: DC | PRN
  Administered 2021-03-16 (×2): 500 mL

## 2021-03-16 MED ORDER — LABETALOL HCL 5 MG/ML IV SOLN: 10.0000 mg | INTRAVENOUS | Status: DC | PRN

## 2021-03-16 MED ORDER — HYDRALAZINE HCL 20 MG/ML IJ SOLN: 10.0000 mg | INTRAMUSCULAR | Status: DC | PRN

## 2021-03-16 MED ORDER — SODIUM CHLORIDE 0.9 % WEIGHT BASED INFUSION: 1.0000 mL/kg/h | INTRAVENOUS | Status: DC

## 2021-03-16 MED ORDER — ONDANSETRON HCL 4 MG/2ML IJ SOLN: 4.0000 mg | Freq: Four times a day (QID) | INTRAMUSCULAR | Status: DC | PRN

## 2021-03-16 MED ORDER — LIDOCAINE HCL (PF) 1 % IJ SOLN
INTRAMUSCULAR | Status: DC | PRN
  Administered 2021-03-16 (×2): 2 mL

## 2021-03-16 MED ORDER — FENTANYL CITRATE (PF) 100 MCG/2ML IJ SOLN
INTRAMUSCULAR | Status: AC
  Filled 2021-03-16: qty 2

## 2021-03-16 SURGICAL SUPPLY — 12 items
CATH 5FR JL3.5 JR4 ANG PIG MP (CATHETERS) ×1 IMPLANT
CATH SWAN GANZ 7F STRAIGHT (CATHETERS) ×1 IMPLANT
DEVICE RAD COMP TR BAND LRG (VASCULAR PRODUCTS) ×1 IMPLANT
GLIDESHEATH SLEND SS 6F .021 (SHEATH) ×1 IMPLANT
GLIDESHEATH SLENDER 7FR .021G (SHEATH) ×1 IMPLANT
GUIDEWIRE .025 260CM (WIRE) ×1 IMPLANT
GUIDEWIRE INQWIRE 1.5J.035X260 (WIRE) IMPLANT
INQWIRE 1.5J .035X260CM (WIRE) ×2
KIT HEART LEFT (KITS) ×4 IMPLANT
PACK CARDIAC CATHETERIZATION (CUSTOM PROCEDURE TRAY) ×4 IMPLANT
TRANSDUCER W/STOPCOCK (MISCELLANEOUS) ×4 IMPLANT
TUBING CIL FLEX 10 FLL-RA (TUBING) ×4 IMPLANT

## 2021-03-16 NOTE — Interval H&P Note (Signed)
History and Physical Interval Note:  03/16/2021 9:58 AM  Isaac Diaz  has presented today for surgery, with the diagnosis of aortic stenosis.  The various methods of treatment have been discussed with the patient and family. After consideration of risks, benefits and other options for treatment, the patient has consented to  Procedure(s): RIGHT/LEFT HEART CATH AND CORONARY ANGIOGRAPHY (N/A) as a surgical intervention.  The patient's history has been reviewed, patient examined, no change in status, stable for surgery.  I have reviewed the patient's chart and labs.  Questions were answered to the patient's satisfaction.    Cath Lab Visit (complete for each Cath Lab visit)  Clinical Evaluation Leading to the Procedure:   ACS: No.  Non-ACS:    Anginal Classification: CCS II  Anti-ischemic medical therapy: Minimal Therapy (1 class of medications)  Non-Invasive Test Results: No non-invasive testing performed  Prior CABG: No previous CABG        Verne Carrow

## 2021-03-16 NOTE — Progress Notes (Signed)
On arrival to [facility] post R/LHC pts c/o right bicep fatigue and tightness. Site swollen pressure held 15 minutes, level 0 now, VSS, pt states, feels better.

## 2021-03-19 ENCOUNTER — Encounter (HOSPITAL_COMMUNITY): Payer: Self-pay | Admitting: Cardiovascular Disease

## 2021-03-28 ENCOUNTER — Encounter: Payer: Self-pay | Admitting: *Deleted

## 2021-03-28 ENCOUNTER — Institutional Professional Consult (permissible substitution) (INDEPENDENT_AMBULATORY_CARE_PROVIDER_SITE_OTHER): Payer: Managed Care, Other (non HMO) | Admitting: Thoracic Surgery (Cardiothoracic Vascular Surgery)

## 2021-03-28 ENCOUNTER — Other Ambulatory Visit: Payer: Self-pay | Admitting: *Deleted

## 2021-03-28 ENCOUNTER — Other Ambulatory Visit: Payer: Self-pay

## 2021-03-28 VITALS — BP 136/73 | HR 69 | Resp 20 | Ht 74.0 in | Wt 180.0 lb

## 2021-03-28 DIAGNOSIS — I350 Nonrheumatic aortic (valve) stenosis: Secondary | ICD-10-CM

## 2021-03-28 NOTE — Progress Notes (Signed)
PCP is Patient, No Pcp Per (Inactive) Referring Provider is Kathleene Hazel*  Chief Complaint  Patient presents with   Aortic Stenosis    Surgical consult, ECHO 11/24/20, Cardiac Cath 03/16/21    HPI: Isaac Diaz is sent for consultation regarding aortic stenosis  Isaac Diaz is a 56 year old man with a history of a heart murmur and a bicuspid aortic valve.  He also has a history of an enlarged prostate.  He has been followed with serial echocardiograms.  In October 2022 had an echocardiogram which showed progression of his aortic stenosis to severe with a valve area calculated 0.9 cm.  He had mild to moderate AI and it was felt that his valve area may have been overestimated.  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  He has been having difficulty with fatigue and shortness of breath with exertion.  He also notes that if he gets up quickly to go to the bathroom he sometimes sees spots and feels a little dizzy.  He has not had any syncope.  He denies chest pain, pressure, or tightness.  No peripheral edema.   Past Medical History:  Diagnosis Date   Allergy    Enlarged prostate    Heart murmur     Past Surgical History:  Procedure Laterality Date   AMPUTATION FINGER Right    fingertip right long   RIGHT HEART CATH AND CORONARY ANGIOGRAPHY N/A 03/16/2021   Procedure: RIGHT HEART CATH AND CORONARY ANGIOGRAPHY;  Surgeon: Kathleene Hazel, MD;  Location: [facility] INVASIVE CV LAB;  Service: Cardiovascular;  Laterality: N/A;    Family History  Problem Relation Age of Onset   Cancer Mother        stomach   Stroke Father 13   Aneurysm Sister        brain aneurysm   Cancer Maternal Grandfather        lung    Social History Social History   Tobacco Use   Smoking status: Every Day    Packs/day: 0.50    Years: 28.00    Pack years: 14.00    Types: Cigarettes    Start date: 02/12/1988   Smokeless tobacco: Never  Vaping Use   Vaping Use: Never used   Substance Use Topics   Alcohol use: Yes    Comment:  weekly   Drug use: Yes    Types: Marijuana    Comment: rare    Current Outpatient Medications  Medication Sig Dispense Refill   amLODipine (NORVASC) 5 MG tablet Take 1 tablet (5 mg total) by mouth daily. 90 tablet 3   amoxicillin-clavulanate (AUGMENTIN) 875-125 MG tablet Take 1 tablet by mouth every 12 (twelve) hours. (Patient not taking: Reported on 03/09/2021) 14 tablet 0   lidocaine (XYLOCAINE) 2 % solution Use as directed 10 mLs in the mouth or throat every 3 (three) hours as needed for mouth pain. (Patient not taking: Reported on 03/09/2021) 100 mL 0   predniSONE (STERAPRED UNI-PAK 48 TAB) 10 MG (48) TBPK tablet Take as directed. (Patient not taking: Reported on 10/12/2020) 48 tablet 0   Current Facility-Administered Medications  Medication Dose Route Frequency Provider Last Rate Last Admin   sodium chloride flush (NS) 0.9 % injection 3 mL  3 mL Intravenous Q12H Branch, Dorothe Pea, MD        Allergies  Allergen Reactions   Other Anaphylaxis    Feathers (pillow stuffing)   Chocolate Hives    Review of Systems  Constitutional:  Positive for activity change and fatigue.  Respiratory:  Positive for shortness of breath.   Cardiovascular:  Negative for chest pain, palpitations and leg swelling.  Genitourinary:  Positive for frequency.  Neurological:  Positive for dizziness (See HPI). Negative for syncope and weakness.  Hematological:  Negative for adenopathy. Does not bruise/bleed easily.  All other systems reviewed and are negative.  There were no vitals taken for this visit. Physical Exam Vitals reviewed.  Constitutional:      General: He is not in acute distress.    Appearance: Normal appearance.  HENT:     Head: Normocephalic and atraumatic.  Eyes:     General: No scleral icterus.    Extraocular Movements: Extraocular movements intact.  Neck:     Vascular: Carotid bruit (Transmitted murmur) present.   Cardiovascular:     Rate and Rhythm: Normal rate and regular rhythm.     Pulses: Normal pulses.     Heart sounds: Murmur (3/6 crescendo decrescendo throughout precordium) heard.  Pulmonary:     Effort: Pulmonary effort is normal. No respiratory distress.     Breath sounds: Normal breath sounds. No wheezing or rales.  Abdominal:     General: There is no distension.     Palpations: Abdomen is soft.  Musculoskeletal:     Cervical back: Neck supple.  Lymphadenopathy:     Cervical: No cervical adenopathy.  Skin:    General: Skin is warm and dry.  Neurological:     General: No focal deficit present.     Mental Status: He is alert and oriented to person, place, and time.     Cranial Nerves: No cranial nerve deficit.     Motor: No weakness.  No edema   Diagnostic Tests: CARDIAC CATH 03/16/21 Conclusion  No angiographic evidence of CAD Normal right heart pressures Severe aortic stenosis by echo. I did not cross the aortic valve today.    Recommendations: Continue planning for AVR. He is likely going to be best treated with surgical AVR. I will make a referral to CT surgery to discuss this.   ECHO 11/24/20 IMPRESSIONS     1. AoV appears bicuspid with fusion of the RCC/NCC. There is severe  aortic stenosis. Vmax 4.4 m/s, MG 43 mmHG, AVA 0.94 cm2, DI 0.30. Due to  mild to moderate AI, DI is inaccurate and AVA is overestimated (LVOT VTI  31 cm). Findings are still consistent  with severe AS and mild to moderate AI. The aortic valve is bicuspid.  Aortic valve regurgitation is mild to moderate. Severe aortic valve  stenosis. Aortic valve area, by VTI measures 0.94 cm. Aortic valve mean  gradient measures 43.0 mmHg. Aortic valve  Vmax measures 4.45 m/s.   2. Left ventricular ejection fraction, by estimation, is 60 to 65%. The  left ventricle has normal function. The left ventricle has no regional  wall motion abnormalities. There is mild concentric left ventricular  hypertrophy.  Left ventricular diastolic  parameters were normal.   3. Right ventricular systolic function is normal. The right ventricular  size is normal. Tricuspid regurgitation signal is inadequate for assessing  PA pressure.   4. Left atrial size was mildly dilated.   5. The mitral valve is grossly normal. No evidence of mitral valve  regurgitation. No evidence of mitral stenosis.   6. The inferior vena cava is normal in size with greater than 50%  respiratory variability, suggesting right atrial pressure of 3 mmHg.   Comparison(s): Changes from prior study  are noted. AS is now severe.   I personally reviewed the catheterization and relevant echocardiogram images.  There is a bicuspid aortic valve with severe aortic stenosis and mild to moderate AI.  Normal coronary arteries.  Impression: Isaac Diaz is a 56 year old man with a longstanding heart murmur and bicuspid aortic valve.  He has had progression of his aortic stenosis on echocardiogram.  It is now severe and he also has mild to moderate aortic insufficiency.  He has preserved left ventricular systolic function.  There is some left ventricular hypertrophy.  He has become symptomatic with some shortness of breath with exertion, fatigue, and presyncopal spells.  He does not have orthopnea, paroxysmal nocturnal dyspnea or peripheral edema.  Severe aortic stenosis-valve area measured at 0.94 cm.  Mean gradient of 43 mmHg.  Now symptomatic.  Aortic valve replacement is indicated.  We discussed the options of surgical AVR versus TAVR, and also the options of mechanical versus tissue valves.  We discussed the relative advantages and disadvantages of each. TAVR or tissue valve would avoid that needed, but he would be at high risk for early failure and need for repeat procedures.  I described the proposed procedure of surgical aortic valve replacement with Mr. Lanz and his sister.  He understands the general nature of the procedure including the  incisions to be used, the use of cardiopulmonary bypass, the use of drainage tubes and temporary pacemaker wires postoperatively.  I informed him of the indications, risks, benefits, and alternatives.  He understands the risks include, but are not limited to death, MI, DVT, PE, bleeding, possible need for transfusion, infection, cardiac arrhythmias, heart block requiring pacemaker, as well as other unforeseeable complications such as respiratory or renal failure.  He accepts the risk and wishes to proceed.  He wishes to have a mechanical valve replacement.  He understands that requires lifelong anticoagulation.  He had two teeth removed and the remaining teeth checked about a month ago.  He is good to go from that standpoint.  Plan: Surgical AVR with mechanical valve on Wednesday, 04/04/2021  Loreli Slot, MD [facility] [phone]

## 2021-03-28 NOTE — H&P (View-Only) (Signed)
PCP is Patient, No Pcp Per (Inactive) Referring Provider is Kathleene Hazel*  Chief Complaint  Patient presents with   Aortic Stenosis    Surgical consult, ECHO 11/24/20, Cardiac Cath 03/16/21    HPI: [name] is sent for consultation regarding aortic stenosis  Isaac Diaz is a 56 year old man with a history of a heart murmur and a bicuspid aortic valve.  He also has a history of an enlarged prostate.  He has been followed with serial echocardiograms.  In October 2022 had an echocardiogram which showed progression of his aortic stenosis to severe with a valve area calculated 0.9 cm.  He had mild to moderate AI and it was felt that his valve area may have been overestimated.  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  He has been having difficulty with fatigue and shortness of breath with exertion.  He also notes that if he gets up quickly to go to the bathroom he sometimes sees spots and feels a little dizzy.  He has not had any syncope.  He denies chest pain, pressure, or tightness.  No peripheral edema.   Past Medical History:  Diagnosis Date   Allergy    Enlarged prostate    Heart murmur     Past Surgical History:  Procedure Laterality Date   AMPUTATION FINGER Right    fingertip right long   RIGHT HEART CATH AND CORONARY ANGIOGRAPHY N/A 03/16/2021   Procedure: RIGHT HEART CATH AND CORONARY ANGIOGRAPHY;  Surgeon: Kathleene Hazel, MD;  Location: [facility];  Service: Cardiovascular;  Laterality: N/A;    Family History  Problem Relation Age of Onset   Cancer Mother        stomach   Stroke Father 13   Aneurysm Sister        brain aneurysm   Cancer Maternal Grandfather        lung    Social History Social History   Tobacco Use   Smoking status: Every Day    Packs/day: 0.50    Years: 28.00    Pack years: 14.00    Types: Cigarettes    Start date: 02/12/1988   Smokeless tobacco: Never  Vaping Use   Vaping Use: Never used   Substance Use Topics   Alcohol use: Yes    Comment:  weekly   Drug use: Yes    Types: Marijuana    Comment: rare    Current Outpatient Medications  Medication Sig Dispense Refill   amLODipine (NORVASC) 5 MG tablet Take 1 tablet (5 mg total) by mouth daily. 90 tablet 3   amoxicillin-clavulanate (AUGMENTIN) 875-125 MG tablet Take 1 tablet by mouth every 12 (twelve) hours. (Patient not taking: Reported on 03/09/2021) 14 tablet 0   lidocaine (XYLOCAINE) 2 % solution Use as directed 10 mLs in the mouth or throat every 3 (three) hours as needed for mouth pain. (Patient not taking: Reported on 03/09/2021) 100 mL 0   predniSONE (STERAPRED UNI-PAK 48 TAB) 10 MG (48) TBPK tablet Take as directed. (Patient not taking: Reported on 10/12/2020) 48 tablet 0   Current Facility-Administered Medications  Medication Dose Route Frequency Provider Last Rate Last Admin   sodium chloride flush (NS) 0.9 % injection 3 mL  3 mL Intravenous Q12H Branch, Dorothe Pea, MD        Allergies  Allergen Reactions   Other Anaphylaxis    Feathers (pillow stuffing)   Chocolate Hives    Review of Systems  Constitutional:  Positive for activity change and fatigue.  Respiratory:  Positive for shortness of breath.   Cardiovascular:  Negative for chest pain, palpitations and leg swelling.  Genitourinary:  Positive for frequency.  Neurological:  Positive for dizziness (See HPI). Negative for syncope and weakness.  Hematological:  Negative for adenopathy. Does not bruise/bleed easily.  All other systems reviewed and are negative.  There were no vitals taken for this visit. Physical Exam Vitals reviewed.  Constitutional:      General: He is not in acute distress.    Appearance: Normal appearance.  HENT:     Head: Normocephalic and atraumatic.  Eyes:     General: No scleral icterus.    Extraocular Movements: Extraocular movements intact.  Neck:     Vascular: Carotid bruit (Transmitted murmur) present.   Cardiovascular:     Rate and Rhythm: Normal rate and regular rhythm.     Pulses: Normal pulses.     Heart sounds: Murmur (3/6 crescendo decrescendo throughout precordium) heard.  Pulmonary:     Effort: Pulmonary effort is normal. No respiratory distress.     Breath sounds: Normal breath sounds. No wheezing or rales.  Abdominal:     General: There is no distension.     Palpations: Abdomen is soft.  Musculoskeletal:     Cervical back: Neck supple.  Lymphadenopathy:     Cervical: No cervical adenopathy.  Skin:    General: Skin is warm and dry.  Neurological:     General: No focal deficit present.     Mental Status: He is alert and oriented to person, place, and time.     Cranial Nerves: No cranial nerve deficit.     Motor: No weakness.  No edema   Diagnostic Tests: CARDIAC CATH 03/16/21 Conclusion  No angiographic evidence of CAD Normal right heart pressures Severe aortic stenosis by echo. I did not cross the aortic valve today.    Recommendations: Continue planning for AVR. He is likely going to be best treated with surgical AVR. I will make a referral to CT surgery to discuss this.   ECHO 11/24/20 IMPRESSIONS     1. AoV appears bicuspid with fusion of the RCC/NCC. There is severe  aortic stenosis. Vmax 4.4 m/s, MG 43 mmHG, AVA 0.94 cm2, DI 0.30. Due to  mild to moderate AI, DI is inaccurate and AVA is overestimated (LVOT VTI  31 cm). Findings are still consistent  with severe AS and mild to moderate AI. The aortic valve is bicuspid.  Aortic valve regurgitation is mild to moderate. Severe aortic valve  stenosis. Aortic valve area, by VTI measures 0.94 cm. Aortic valve mean  gradient measures 43.0 mmHg. Aortic valve  Vmax measures 4.45 m/s.   2. Left ventricular ejection fraction, by estimation, is 60 to 65%. The  left ventricle has normal function. The left ventricle has no regional  wall motion abnormalities. There is mild concentric left ventricular  hypertrophy.  Left ventricular diastolic  parameters were normal.   3. Right ventricular systolic function is normal. The right ventricular  size is normal. Tricuspid regurgitation signal is inadequate for assessing  PA pressure.   4. Left atrial size was mildly dilated.   5. The mitral valve is grossly normal. No evidence of mitral valve  regurgitation. No evidence of mitral stenosis.   6. The inferior vena cava is normal in size with greater than 50%  respiratory variability, suggesting right atrial pressure of 3 mmHg.   Comparison(s): Changes from prior study  are noted. AS is now severe.   I personally reviewed the catheterization and relevant echocardiogram images.  There is a bicuspid aortic valve with severe aortic stenosis and mild to moderate AI.  Normal coronary arteries.  Impression: Isaac Diaz is a 56 year old man with a longstanding heart murmur and bicuspid aortic valve.  He has had progression of his aortic stenosis on echocardiogram.  It is now severe and he also has mild to moderate aortic insufficiency.  He has preserved left ventricular systolic function.  There is some left ventricular hypertrophy.  He has become symptomatic with some shortness of breath with exertion, fatigue, and presyncopal spells.  He does not have orthopnea, paroxysmal nocturnal dyspnea or peripheral edema.  Severe aortic stenosis-valve area measured at 0.94 cm.  Mean gradient of 43 mmHg.  Now symptomatic.  Aortic valve replacement is indicated.  We discussed the options of surgical AVR versus TAVR, and also the options of mechanical versus tissue valves.  We discussed the relative advantages and disadvantages of each. TAVR or tissue valve would avoid that needed, but he would be at high risk for early failure and need for repeat procedures.  I described the proposed procedure of surgical aortic valve replacement with Isaac Diaz and his sister.  He understands the general nature of the procedure including the  incisions to be used, the use of cardiopulmonary bypass, the use of drainage tubes and temporary pacemaker wires postoperatively.  I informed him of the indications, risks, benefits, and alternatives.  He understands the risks include, but are not limited to death, MI, DVT, PE, bleeding, possible need for transfusion, infection, cardiac arrhythmias, heart block requiring pacemaker, as well as other unforeseeable complications such as respiratory or renal failure.  He accepts the risk and wishes to proceed.  He wishes to have a mechanical valve replacement.  He understands that requires lifelong anticoagulation.  He had two teeth removed and the remaining teeth checked about a month ago.  He is good to go from that standpoint.  Plan: Surgical AVR with mechanical valve on Wednesday, 04/04/2021  Loreli Slot, MD [facility] [phone]

## 2021-03-29 ENCOUNTER — Other Ambulatory Visit: Payer: Self-pay | Admitting: *Deleted

## 2021-04-02 NOTE — Pre-Procedure Instructions (Signed)
Surgical Instructions    Your procedure is scheduled on Wednesday, February 22nd.  Report to [facility] Main Entrance "A" at 5:30 A.M., then check in with the Admitting office.  Call this number if you have problems the morning of surgery:  [phone]   If you have any questions prior to your surgery date call [phone]: Open Monday-Friday 8am-4pm    Remember:  Do not eat or drink after midnight the night before your surgery   Take these medicines the morning of surgery with A SIP OF WATER:  amLODipine (NORVASC)   As of today, STOP taking any Aspirin (unless otherwise instructed by your surgeon) Aleve, Naproxen, Ibuprofen, Motrin, Advil, Goody's, BC's, all herbal medications, fish oil, and all vitamins.                     Do NOT Smoke (Tobacco/Vaping) for 24 hours prior to your procedure.  If you use a CPAP at night, you may bring your mask/headgear for your overnight stay.   Contacts, glasses, piercing's, hearing aid's, dentures or partials may not be worn into surgery, please bring cases for these belongings.    For patients admitted to the hospital, discharge time will be determined by your treatment team.   Patients discharged the day of surgery will not be allowed to drive home, and someone needs to stay with them for 24 hours.  NO VISITORS WILL BE ALLOWED IN PRE-OP WHERE PATIENTS ARE PREPPED FOR SURGERY.  ONLY 1 SUPPORT PERSON MAY BE PRESENT IN THE WAITING ROOM WHILE YOU ARE IN SURGERY.  IF YOU ARE TO BE ADMITTED, ONCE YOU ARE IN YOUR ROOM YOU WILL BE ALLOWED TWO (2) VISITORS. (1) VISITOR MAY STAY OVERNIGHT BUT MUST ARRIVE TO THE ROOM BY 8pm.  Minor children may have two parents present. Special consideration for safety and communication needs will be reviewed on a case by case basis.   Special instructions:   [facility]- Preparing For Surgery  Before surgery, you can play an important role. Because skin is not sterile, your skin needs to be as free of germs as  possible. You can reduce the number of germs on your skin by washing with CHG (chlorahexidine gluconate) Soap before surgery.  CHG is an antiseptic cleaner which kills germs and bonds with the skin to continue killing germs even after washing.    Oral Hygiene is also important to reduce your risk of infection.  Remember - BRUSH YOUR TEETH THE MORNING OF SURGERY WITH YOUR REGULAR TOOTHPASTE  Please do not use if you have an allergy to CHG or antibacterial soaps. If your skin becomes reddened/irritated stop using the CHG.  Do not shave (including legs and underarms) for at least 48 hours prior to first CHG shower. It is OK to shave your face.  Please follow these instructions carefully.   Shower the NIGHT BEFORE SURGERY and the MORNING OF SURGERY  If you chose to wash your hair, wash your hair first as usual with your normal shampoo.  After you shampoo, rinse your hair and body thoroughly to remove the shampoo.  Use CHG Soap as you would any other liquid soap. You can apply CHG directly to the skin and wash gently with a scrungie or a clean washcloth.   Apply the CHG Soap to your body ONLY FROM THE NECK DOWN.  Do not use on open wounds or open sores. Avoid contact with your eyes, ears, mouth and genitals (private parts). Wash Face and genitals (private  parts)  with your normal soap.   Wash thoroughly, paying special attention to the area where your surgery will be performed.  Thoroughly rinse your body with warm water from the neck down.  DO NOT shower/wash with your normal soap after using and rinsing off the CHG Soap.  Pat yourself dry with a CLEAN TOWEL.  Wear CLEAN PAJAMAS to bed the night before surgery  Place CLEAN SHEETS on your bed the night before your surgery  DO NOT SLEEP WITH PETS.   Day of Surgery: Shower with CHG soap. Do not wear jewelry. Do not wear lotions, powders, colognes, or deodorant. Men may shave face and neck. Do not bring valuables to the hospital. [facility] is not responsible for any belongings or valuables. Wear Clean/Comfortable clothing the morning of surgery Remember to brush your teeth WITH YOUR REGULAR TOOTHPASTE.   Please read over the following fact sheets that you were given.   3 days prior to your procedure or After your COVID test   You are not required to quarantine however you are required to wear a well-fitting mask when you are out and around people not in your household. If your mask becomes wet or soiled, replace with a new one.   Wash your hands often with soap and water for 20 seconds or clean your hands with an alcohol-based hand sanitizer that contains at least 60% alcohol.   Do not share personal items.   Notify your provider:  o if you are in close contact with someone who has COVID  o or if you develop a fever of 100.4 or greater, sneezing, cough, sore throat, shortness of breath or body aches.

## 2021-04-03 ENCOUNTER — Ambulatory Visit (HOSPITAL_BASED_OUTPATIENT_CLINIC_OR_DEPARTMENT_OTHER)
Admission: RE | Admit: 2021-04-03 | Discharge: 2021-04-03 | Disposition: A | Discharge status: Home / Self Care | Payer: Managed Care, Other (non HMO) | Source: Ambulatory Visit | Attending: Thoracic Surgery (Cardiothoracic Vascular Surgery) | Admitting: Thoracic Surgery (Cardiothoracic Vascular Surgery)

## 2021-04-03 ENCOUNTER — Encounter (HOSPITAL_COMMUNITY)
Admission: RE | Admit: 2021-04-03 | Discharge: 2021-04-03 | Disposition: A | Discharge status: Home / Self Care | Payer: Managed Care, Other (non HMO) | Source: Ambulatory Visit | Attending: Thoracic Surgery (Cardiothoracic Vascular Surgery) | Admitting: Thoracic Surgery (Cardiothoracic Vascular Surgery)

## 2021-04-03 ENCOUNTER — Encounter (HOSPITAL_COMMUNITY): Payer: Self-pay

## 2021-04-03 ENCOUNTER — Other Ambulatory Visit: Payer: Self-pay

## 2021-04-03 ENCOUNTER — Other Ambulatory Visit: Payer: Self-pay | Admitting: *Deleted

## 2021-04-03 VITALS — BP 130/77 | HR 79 | Temp 98.2°F | Resp 17 | Ht 74.0 in | Wt 181.2 lb

## 2021-04-03 DIAGNOSIS — I350 Nonrheumatic aortic (valve) stenosis: Secondary | ICD-10-CM | POA: Diagnosis not present

## 2021-04-03 DIAGNOSIS — Z01810 Encounter for preprocedural cardiovascular examination: Secondary | ICD-10-CM | POA: Diagnosis not present

## 2021-04-03 DIAGNOSIS — Z20822 Contact with and (suspected) exposure to covid-19: Secondary | ICD-10-CM | POA: Insufficient documentation

## 2021-04-03 DIAGNOSIS — Z01812 Encounter for preprocedural laboratory examination: Secondary | ICD-10-CM | POA: Insufficient documentation

## 2021-04-03 DIAGNOSIS — Z01818 Encounter for other preprocedural examination: Secondary | ICD-10-CM

## 2021-04-03 LAB — URINALYSIS, ROUTINE W REFLEX MICROSCOPIC
Bilirubin Urine: NEGATIVE
Glucose, UA: NEGATIVE mg/dL
Hgb urine dipstick: NEGATIVE
Ketones, ur: NEGATIVE mg/dL
Leukocytes,Ua: NEGATIVE
Nitrite: NEGATIVE
Protein, ur: NEGATIVE mg/dL
Specific Gravity, Urine: 1.018 (ref 1.005–1.030)
pH: 5 (ref 5.0–8.0)

## 2021-04-03 LAB — COMPREHENSIVE METABOLIC PANEL
ALT: 18 U/L (ref 0–44)
AST: 22 U/L (ref 15–41)
Albumin: 4.1 g/dL (ref 3.5–5.0)
Alkaline Phosphatase: 60 U/L (ref 38–126)
Anion gap: 9 (ref 5–15)
BUN: 8 mg/dL (ref 6–20)
CO2: 20 mmol/L — ABNORMAL LOW (ref 22–32)
Calcium: 9.4 mg/dL (ref 8.9–10.3)
Chloride: 106 mmol/L (ref 98–111)
Creatinine, Ser: 0.92 mg/dL (ref 0.61–1.24)
GFR, Estimated: >60 mL/min (ref >60)
Glucose, Bld: 101 mg/dL — ABNORMAL HIGH (ref 70–99)
Potassium: 4.2 mmol/L (ref 3.5–5.1)
Sodium: 135 mmol/L (ref 135–145)
Total Bilirubin: 1.1 mg/dL (ref 0.3–1.2)
Total Protein: 7 g/dL (ref 6.5–8.1)

## 2021-04-03 LAB — BLOOD GAS, ARTERIAL
Acid-base deficit: 0.3 mmol/L (ref 0.0–2.0)
Bicarbonate: 22.9 mmol/L (ref 20.0–28.0)
FIO2: 21 %
O2 Saturation: 99 %
Patient temperature: 37
pCO2 arterial: 33 mmHg (ref 32–48)
pH, Arterial: 7.45 (ref 7.35–7.45)
pO2, Arterial: 89 mmHg (ref 83–108)

## 2021-04-03 LAB — CBC
HCT: 43.6 % (ref 39.0–52.0)
Hemoglobin: 14.6 g/dL (ref 13.0–17.0)
MCH: 32.6 pg (ref 26.0–34.0)
MCHC: 33.5 g/dL (ref 30.0–36.0)
MCV: 97.3 fL (ref 80.0–100.0)
Platelets: 157 10*3/uL (ref 150–400)
RBC: 4.48 MIL/uL (ref 4.22–5.81)
RDW: 12.9 % (ref 11.5–15.5)
WBC: 5.5 10*3/uL (ref 4.0–10.5)
nRBC: 0 % (ref 0.0–0.2)

## 2021-04-03 LAB — TYPE AND SCREEN
ABO/RH(D): B POS
Antibody Screen: NEGATIVE

## 2021-04-03 LAB — HEMOGLOBIN A1C
Hgb A1c MFr Bld: 5.4 % (ref 4.8–5.6)
Mean Plasma Glucose: 108 mg/dL

## 2021-04-03 LAB — SARS CORONAVIRUS 2 BY RT PCR (HOSPITAL ORDER, PERFORMED IN ~~LOC~~ HOSPITAL LAB): SARS Coronavirus 2: NEGATIVE

## 2021-04-03 LAB — APTT: aPTT: 28 seconds (ref 24–36)

## 2021-04-03 LAB — SURGICAL PCR SCREEN
MRSA, PCR: POSITIVE — AB
Staphylococcus aureus: POSITIVE — AB

## 2021-04-03 LAB — PROTIME-INR
INR: 1.3 — ABNORMAL HIGH (ref 0.8–1.2)
Prothrombin Time: 15.8 seconds — ABNORMAL HIGH (ref 11.4–15.2)

## 2021-04-03 MED ORDER — PLASMA-LYTE A IV SOLN
INTRAVENOUS | Status: DC
  Filled 2021-04-03: qty 2.5

## 2021-04-03 MED ORDER — TRANEXAMIC ACID (OHS) BOLUS VIA INFUSION
15.0000 mg/kg | INTRAVENOUS | Status: AC
  Administered 2021-04-04: 1224 mg via INTRAVENOUS
  Filled 2021-04-03: qty 1224

## 2021-04-03 MED ORDER — SODIUM CHLORIDE 0.9 % IV SOLN
1.5000 mg/kg/h | INTRAVENOUS | Status: AC
  Administered 2021-04-04: 1.5 mg/kg/h via INTRAVENOUS
  Filled 2021-04-03: qty 25

## 2021-04-03 MED ORDER — PHENYLEPHRINE HCL-NACL 20-0.9 MG/250ML-% IV SOLN
30.0000 ug/min | INTRAVENOUS | Status: AC
  Administered 2021-04-04: 20 ug/min via INTRAVENOUS
  Filled 2021-04-03: qty 250

## 2021-04-03 MED ORDER — HEPARIN 30,000 UNITS/1000 ML (OHS) CELLSAVER SOLUTION
Status: DC
  Filled 2021-04-03: qty 1000

## 2021-04-03 MED ORDER — INSULIN REGULAR(HUMAN) IN NACL 100-0.9 UT/100ML-% IV SOLN
INTRAVENOUS | Status: AC
  Administered 2021-04-04: 1.1 [IU]/h via INTRAVENOUS
  Filled 2021-04-03: qty 100

## 2021-04-03 MED ORDER — CEFAZOLIN SODIUM-DEXTROSE 2-4 GM/100ML-% IV SOLN
2.0000 g | INTRAVENOUS | Status: AC
  Administered 2021-04-04: 2 g via INTRAVENOUS
  Filled 2021-04-03 (×2): qty 100

## 2021-04-03 MED ORDER — CEFAZOLIN SODIUM-DEXTROSE 2-4 GM/100ML-% IV SOLN
2.0000 g | INTRAVENOUS | Status: AC
  Administered 2021-04-04: 2 g via INTRAVENOUS

## 2021-04-03 MED ORDER — DEXMEDETOMIDINE HCL IN NACL 400 MCG/100ML IV SOLN
0.1000 ug/kg/h | INTRAVENOUS | Status: AC
  Administered 2021-04-04: .5 ug/kg/h via INTRAVENOUS
  Filled 2021-04-03: qty 100

## 2021-04-03 MED ORDER — EPINEPHRINE HCL 5 MG/250ML IV SOLN IN NS
0.0000 ug/min | INTRAVENOUS | Status: DC
  Filled 2021-04-03: qty 250

## 2021-04-03 MED ORDER — MILRINONE LACTATE IN DEXTROSE 20-5 MG/100ML-% IV SOLN
0.3000 ug/kg/min | INTRAVENOUS | Status: DC
  Filled 2021-04-03: qty 100

## 2021-04-03 MED ORDER — NOREPINEPHRINE 4 MG/250ML-% IV SOLN
0.0000 ug/min | INTRAVENOUS | Status: DC
  Filled 2021-04-03: qty 250

## 2021-04-03 MED ORDER — TRANEXAMIC ACID (OHS) PUMP PRIME SOLUTION
2.0000 mg/kg | INTRAVENOUS | Status: DC
  Filled 2021-04-03: qty 1.63

## 2021-04-03 MED ORDER — VANCOMYCIN HCL 1250 MG/250ML IV SOLN
1250.0000 mg | INTRAVENOUS | Status: AC
  Administered 2021-04-04: 1250 mg via INTRAVENOUS
  Filled 2021-04-03: qty 250

## 2021-04-03 MED ORDER — POTASSIUM CHLORIDE 2 MEQ/ML IV SOLN
80.0000 meq | INTRAVENOUS | Status: DC
  Filled 2021-04-03: qty 40

## 2021-04-03 MED ORDER — MAGNESIUM SULFATE 50 % IJ SOLN
40.0000 meq | INTRAMUSCULAR | Status: DC
Start: 2021-04-04 — End: 2021-04-04
  Filled 2021-04-03: qty 9.85

## 2021-04-03 MED ORDER — NITROGLYCERIN IN D5W 200-5 MCG/ML-% IV SOLN
2.0000 ug/min | INTRAVENOUS | Status: AC
  Administered 2021-04-04: 10 ug/min via INTRAVENOUS
  Filled 2021-04-03: qty 250

## 2021-04-03 MED ORDER — BACTROBAN NASAL 2 % NA OINT: 1.0000 "application " | TOPICAL_OINTMENT | Freq: Two times a day (BID) | NASAL | 0 refills | Status: DC

## 2021-04-03 NOTE — Anesthesia Preprocedure Evaluation (Addendum)
Anesthesia Evaluation  Patient identified by MRN, date of birth, ID band Patient awake    Reviewed: Allergy & Precautions, NPO status , Patient's Chart, lab work & pertinent test results  Airway Mallampati: II  TM Distance: >3 FB Neck ROM: Full    Dental no notable dental hx.    Pulmonary shortness of breath, Current Smoker and Patient abstained from smoking.,    Pulmonary exam normal breath sounds clear to auscultation       Cardiovascular + Valvular Problems/Murmurs AS  Rhythm:Regular Rate:Normal + Systolic murmurs    Neuro/Psych negative neurological ROS  negative psych ROS   GI/Hepatic negative GI ROS, Neg liver ROS,   Endo/Other  negative endocrine ROS  Renal/GU negative Renal ROS     Musculoskeletal negative musculoskeletal ROS (+)   Abdominal   Peds  Hematology negative hematology ROS (+)   Anesthesia Other Findings AS  Reproductive/Obstetrics                            Anesthesia Physical Anesthesia Plan  ASA: 4  Anesthesia Plan: General   Post-op Pain Management:    Induction: Intravenous  PONV Risk Score and Plan: 1 and Ondansetron, Dexamethasone, Midazolam and Treatment may vary due to age or medical condition  Airway Management Planned: Oral ETT  Additional Equipment: Arterial line, CVP, PA Cath, TEE and Ultrasound Guidance Line Placement  Intra-op Plan:   Post-operative Plan: Post-operative intubation/ventilation  Informed Consent: I have reviewed the patients History and Physical, chart, labs and discussed the procedure including the risks, benefits and alternatives for the proposed anesthesia with the patient or authorized representative who has indicated his/her understanding and acceptance.     Dental advisory given  Plan Discussed with: CRNA  Anesthesia Plan Comments:        Anesthesia Quick Evaluation

## 2021-04-03 NOTE — Progress Notes (Signed)
Bactroban sent to patient's preferred pharmacy for positive MRSA and Staph PCR screening per Dr. Dorris Fetch. Patient aware.

## 2021-04-03 NOTE — Progress Notes (Signed)
Pre-CABG Dopplers completed. Refer to "CV Proc" under chart review to view preliminary results.  04/03/2021 3:02 PM Kelby Aline., MHA, RVT, RDCS, RDMS

## 2021-04-03 NOTE — Progress Notes (Signed)
PCP - denies Cardiologist -Dr. Dominga Ferry   PPM/ICD - n/a  Chest x-ray - 04/03/21 EKG - 03/16/21 Stress Test - denies ECHO - 11/24/20 Cardiac Cath - 03/16/21  Sleep Study - denies CPAP - denies  Blood Thinner Instructions: n/a Aspirin Instructions: n/a  COVID TEST- 04/03/21; done in PAT  Anesthesia review: Yes  Patient denies shortness of breath, fever, cough and chest pain at PAT appointment   All instructions explained to the patient, with a verbal understanding of the material. Patient agrees to go over the instructions while at home for a better understanding. Patient also instructed to self quarantine after being tested for COVID-19. The opportunity to ask questions was provided.

## 2021-04-03 NOTE — Progress Notes (Signed)
Reported to Burna Cash at [facility] regarding pt's positive MRSA/MSSA screening.   Viviano Simas, RN

## 2021-04-04 ENCOUNTER — Inpatient Hospital Stay (HOSPITAL_COMMUNITY): Payer: Managed Care, Other (non HMO)

## 2021-04-04 ENCOUNTER — Inpatient Hospital Stay (HOSPITAL_COMMUNITY): Payer: Managed Care, Other (non HMO) | Admitting: Critical Care Medicine

## 2021-04-04 ENCOUNTER — Encounter (HOSPITAL_COMMUNITY)
Admission: RE | Disposition: A | Discharge status: Home / Self Care | Payer: Self-pay | Source: Ambulatory Visit | Attending: Thoracic Surgery (Cardiothoracic Vascular Surgery)

## 2021-04-04 ENCOUNTER — Inpatient Hospital Stay (HOSPITAL_COMMUNITY)
Admission: RE | Admit: 2021-04-04 | Discharge: 2021-04-11 | DRG: 220 | Disposition: A | Discharge status: Home / Self Care | Payer: Managed Care, Other (non HMO) | Source: Ambulatory Visit | Attending: Thoracic Surgery (Cardiothoracic Vascular Surgery) | Admitting: Thoracic Surgery (Cardiothoracic Vascular Surgery)

## 2021-04-04 ENCOUNTER — Other Ambulatory Visit: Payer: Self-pay

## 2021-04-04 ENCOUNTER — Encounter (HOSPITAL_COMMUNITY): Payer: Self-pay | Admitting: Thoracic Surgery (Cardiothoracic Vascular Surgery)

## 2021-04-04 DIAGNOSIS — N400 Enlarged prostate without lower urinary tract symptoms: Secondary | ICD-10-CM | POA: Diagnosis present

## 2021-04-04 DIAGNOSIS — Z20822 Contact with and (suspected) exposure to covid-19: Secondary | ICD-10-CM | POA: Diagnosis present

## 2021-04-04 DIAGNOSIS — I351 Nonrheumatic aortic (valve) insufficiency: Secondary | ICD-10-CM | POA: Diagnosis not present

## 2021-04-04 DIAGNOSIS — I352 Nonrheumatic aortic (valve) stenosis with insufficiency: Secondary | ICD-10-CM | POA: Diagnosis present

## 2021-04-04 DIAGNOSIS — I471 Supraventricular tachycardia: Secondary | ICD-10-CM | POA: Diagnosis not present

## 2021-04-04 DIAGNOSIS — Z91048 Other nonmedicinal substance allergy status: Secondary | ICD-10-CM | POA: Diagnosis not present

## 2021-04-04 DIAGNOSIS — Z801 Family history of malignant neoplasm of trachea, bronchus and lung: Secondary | ICD-10-CM | POA: Diagnosis not present

## 2021-04-04 DIAGNOSIS — M62838 Other muscle spasm: Secondary | ICD-10-CM | POA: Diagnosis not present

## 2021-04-04 DIAGNOSIS — G8918 Other acute postprocedural pain: Secondary | ICD-10-CM | POA: Diagnosis not present

## 2021-04-04 DIAGNOSIS — Z800 Family history of malignant neoplasm of digestive organs: Secondary | ICD-10-CM

## 2021-04-04 DIAGNOSIS — D6959 Other secondary thrombocytopenia: Secondary | ICD-10-CM | POA: Diagnosis not present

## 2021-04-04 DIAGNOSIS — Z952 Presence of prosthetic heart valve: Secondary | ICD-10-CM

## 2021-04-04 DIAGNOSIS — I350 Nonrheumatic aortic (valve) stenosis: Secondary | ICD-10-CM | POA: Diagnosis not present

## 2021-04-04 DIAGNOSIS — Z91018 Allergy to other foods: Secondary | ICD-10-CM | POA: Diagnosis not present

## 2021-04-04 DIAGNOSIS — D62 Acute posthemorrhagic anemia: Secondary | ICD-10-CM | POA: Diagnosis not present

## 2021-04-04 DIAGNOSIS — J9811 Atelectasis: Secondary | ICD-10-CM | POA: Diagnosis not present

## 2021-04-04 DIAGNOSIS — E8770 Fluid overload, unspecified: Secondary | ICD-10-CM | POA: Diagnosis not present

## 2021-04-04 DIAGNOSIS — J939 Pneumothorax, unspecified: Secondary | ICD-10-CM

## 2021-04-04 DIAGNOSIS — Z79899 Other long term (current) drug therapy: Secondary | ICD-10-CM | POA: Diagnosis not present

## 2021-04-04 DIAGNOSIS — F17210 Nicotine dependence, cigarettes, uncomplicated: Secondary | ICD-10-CM | POA: Diagnosis present

## 2021-04-04 DIAGNOSIS — Z823 Family history of stroke: Secondary | ICD-10-CM | POA: Diagnosis not present

## 2021-04-04 DIAGNOSIS — R0602 Shortness of breath: Secondary | ICD-10-CM

## 2021-04-04 DIAGNOSIS — I4891 Unspecified atrial fibrillation: Secondary | ICD-10-CM | POA: Diagnosis not present

## 2021-04-04 DIAGNOSIS — I10 Essential (primary) hypertension: Secondary | ICD-10-CM | POA: Diagnosis not present

## 2021-04-04 HISTORY — PX: AORTIC VALVE REPLACEMENT: SHX41

## 2021-04-04 HISTORY — PX: TEE WITHOUT CARDIOVERSION: SHX5443

## 2021-04-04 LAB — POCT I-STAT 7, (LYTES, BLD GAS, ICA,H+H)
Acid-Base Excess: 0 mmol/L (ref 0.0–2.0)
Acid-base deficit: 2 mmol/L (ref 0.0–2.0)
Acid-base deficit: 4 mmol/L — ABNORMAL HIGH (ref 0.0–2.0)
Acid-base deficit: 3 mmol/L — ABNORMAL HIGH (ref 0.0–2.0)
Bicarbonate: 25.7 mmol/L (ref 20.0–28.0)
Bicarbonate: 24.8 mmol/L (ref 20.0–28.0)
Bicarbonate: 21.2 mmol/L (ref 20.0–28.0)
Bicarbonate: 22 mmol/L (ref 20.0–28.0)
Calcium, Ion: 1.26 mmol/L (ref 1.15–1.40)
Calcium, Ion: 1.17 mmol/L (ref 1.15–1.40)
Calcium, Ion: 1.14 mmol/L — ABNORMAL LOW (ref 1.15–1.40)
Calcium, Ion: 1.22 mmol/L (ref 1.15–1.40)
HCT: 41 % (ref 39.0–52.0)
HCT: 34 % — ABNORMAL LOW (ref 39.0–52.0)
HCT: 33 % — ABNORMAL LOW (ref 39.0–52.0)
HCT: 33 % — ABNORMAL LOW (ref 39.0–52.0)
Hemoglobin: 13.9 g/dL (ref 13.0–17.0)
Hemoglobin: 11.6 g/dL — ABNORMAL LOW (ref 13.0–17.0)
Hemoglobin: 11.2 g/dL — ABNORMAL LOW (ref 13.0–17.0)
Hemoglobin: 11.2 g/dL — ABNORMAL LOW (ref 13.0–17.0)
O2 Saturation: 100 %
O2 Saturation: 98 %
O2 Saturation: 97 %
O2 Saturation: 90 %
Patient temperature: 34.8
Patient temperature: 37.1
Patient temperature: 37.3
Potassium: 4.4 mmol/L (ref 3.5–5.1)
Potassium: 4.2 mmol/L (ref 3.5–5.1)
Potassium: 4.1 mmol/L (ref 3.5–5.1)
Potassium: 4.4 mmol/L (ref 3.5–5.1)
Sodium: 138 mmol/L (ref 135–145)
Sodium: 140 mmol/L (ref 135–145)
Sodium: 141 mmol/L (ref 135–145)
Sodium: 139 mmol/L (ref 135–145)
TCO2: 27 mmol/L (ref 22–32)
TCO2: 26 mmol/L (ref 22–32)
TCO2: 22 mmol/L (ref 22–32)
TCO2: 23 mmol/L (ref 22–32)
pCO2 arterial: 46.2 mmHg (ref 32–48)
pCO2 arterial: 43.4 mmHg (ref 32–48)
pCO2 arterial: 38.3 mmHg (ref 32–48)
pCO2 arterial: 39.3 mmHg (ref 32–48)
pH, Arterial: 7.354 (ref 7.35–7.45)
pH, Arterial: 7.355 (ref 7.35–7.45)
pH, Arterial: 7.352 (ref 7.35–7.45)
pH, Arterial: 7.358 (ref 7.35–7.45)
pO2, Arterial: 378 mmHg — ABNORMAL HIGH (ref 83–108)
pO2, Arterial: 102 mmHg (ref 83–108)
pO2, Arterial: 100 mmHg (ref 83–108)
pO2, Arterial: 61 mmHg — ABNORMAL LOW (ref 83–108)

## 2021-04-04 LAB — ECHO INTRAOPERATIVE TEE
Height: 74 in
Weight: 2896 oz

## 2021-04-04 LAB — CBC
HCT: 33.2 % — ABNORMAL LOW (ref 39.0–52.0)
HCT: 33.9 % — ABNORMAL LOW (ref 39.0–52.0)
Hemoglobin: 11.5 g/dL — ABNORMAL LOW (ref 13.0–17.0)
Hemoglobin: 11.4 g/dL — ABNORMAL LOW (ref 13.0–17.0)
MCH: 33.7 pg (ref 26.0–34.0)
MCH: 32.8 pg (ref 26.0–34.0)
MCHC: 34.6 g/dL (ref 30.0–36.0)
MCHC: 33.6 g/dL (ref 30.0–36.0)
MCV: 97.4 fL (ref 80.0–100.0)
MCV: 97.4 fL (ref 80.0–100.0)
Platelets: 81 10*3/uL — ABNORMAL LOW (ref 150–400)
Platelets: 98 10*3/uL — ABNORMAL LOW (ref 150–400)
RBC: 3.41 MIL/uL — ABNORMAL LOW (ref 4.22–5.81)
RBC: 3.48 MIL/uL — ABNORMAL LOW (ref 4.22–5.81)
RDW: 13 % (ref 11.5–15.5)
RDW: 12.9 % (ref 11.5–15.5)
WBC: 11.8 10*3/uL — ABNORMAL HIGH (ref 4.0–10.5)
WBC: 10.4 10*3/uL (ref 4.0–10.5)
nRBC: 0 % (ref 0.0–0.2)
nRBC: 0 % (ref 0.0–0.2)

## 2021-04-04 LAB — GLUCOSE, CAPILLARY
Glucose-Capillary: 101 mg/dL — ABNORMAL HIGH (ref 70–99)
Glucose-Capillary: 105 mg/dL — ABNORMAL HIGH (ref 70–99)
Glucose-Capillary: 108 mg/dL — ABNORMAL HIGH (ref 70–99)
Glucose-Capillary: 110 mg/dL — ABNORMAL HIGH (ref 70–99)
Glucose-Capillary: 112 mg/dL — ABNORMAL HIGH (ref 70–99)
Glucose-Capillary: 96 mg/dL (ref 70–99)
Glucose-Capillary: 97 mg/dL (ref 70–99)

## 2021-04-04 LAB — POCT I-STAT, CHEM 8
BUN: 13 mg/dL (ref 6–20)
Calcium, Ion: 1.29 mmol/L (ref 1.15–1.40)
Chloride: 105 mmol/L (ref 98–111)
Creatinine, Ser: 0.7 mg/dL (ref 0.61–1.24)
Glucose, Bld: 119 mg/dL — ABNORMAL HIGH (ref 70–99)
HCT: 41 % (ref 39.0–52.0)
Hemoglobin: 13.9 g/dL (ref 13.0–17.0)
Potassium: 4.5 mmol/L (ref 3.5–5.1)
Sodium: 139 mmol/L (ref 135–145)
TCO2: 26 mmol/L (ref 22–32)

## 2021-04-04 LAB — BASIC METABOLIC PANEL
Anion gap: 7 (ref 5–15)
BUN: 10 mg/dL (ref 6–20)
CO2: 21 mmol/L — ABNORMAL LOW (ref 22–32)
Calcium: 8 mg/dL — ABNORMAL LOW (ref 8.9–10.3)
Chloride: 108 mmol/L (ref 98–111)
Creatinine, Ser: 0.81 mg/dL (ref 0.61–1.24)
GFR, Estimated: >60 mL/min (ref >60)
Glucose, Bld: 105 mg/dL — ABNORMAL HIGH (ref 70–99)
Potassium: 4.6 mmol/L (ref 3.5–5.1)
Sodium: 136 mmol/L (ref 135–145)

## 2021-04-04 LAB — PROTIME-INR
INR: 1.2 (ref 0.8–1.2)
INR: 1.6 — ABNORMAL HIGH (ref 0.8–1.2)
Prothrombin Time: 15.5 seconds — ABNORMAL HIGH (ref 11.4–15.2)
Prothrombin Time: 19.1 seconds — ABNORMAL HIGH (ref 11.4–15.2)

## 2021-04-04 LAB — MAGNESIUM: Magnesium: 3.6 mg/dL — ABNORMAL HIGH (ref 1.7–2.4)

## 2021-04-04 LAB — ABO/RH: ABO/RH(D): B POS

## 2021-04-04 LAB — HEMOGLOBIN AND HEMATOCRIT, BLOOD
HCT: 31.7 % — ABNORMAL LOW (ref 39.0–52.0)
Hemoglobin: 11 g/dL — ABNORMAL LOW (ref 13.0–17.0)

## 2021-04-04 LAB — APTT: aPTT: 29 seconds (ref 24–36)

## 2021-04-04 LAB — PLATELET COUNT: Platelets: 118 10*3/uL — ABNORMAL LOW (ref 150–400)

## 2021-04-04 SURGERY — REPLACEMENT, AORTIC VALVE, OPEN
Anesthesia: General | Site: Chest

## 2021-04-04 MED ORDER — CHLORHEXIDINE GLUCONATE CLOTH 2 % EX PADS
6.0000 | MEDICATED_PAD | Freq: Every day | CUTANEOUS | Status: DC
  Administered 2021-04-04 – 2021-04-05 (×2): 6 via TOPICAL

## 2021-04-04 MED ORDER — INSULIN REGULAR(HUMAN) IN NACL 100-0.9 UT/100ML-% IV SOLN: INTRAVENOUS | Status: DC

## 2021-04-04 MED ORDER — ROCURONIUM BROMIDE 10 MG/ML (PF) SYRINGE
PREFILLED_SYRINGE | INTRAVENOUS | Status: DC | PRN
  Administered 2021-04-04: 100 mg via INTRAVENOUS
  Administered 2021-04-04: 50 mg via INTRAVENOUS
  Administered 2021-04-04: 30 mg via INTRAVENOUS
  Administered 2021-04-04: 50 mg via INTRAVENOUS

## 2021-04-04 MED ORDER — FENTANYL CITRATE (PF) 250 MCG/5ML IJ SOLN
INTRAMUSCULAR | Status: AC
  Filled 2021-04-04: qty 5

## 2021-04-04 MED ORDER — PROPOFOL 10 MG/ML IV BOLUS
INTRAVENOUS | Status: AC
  Filled 2021-04-04: qty 20

## 2021-04-04 MED ORDER — HEMOSTATIC AGENTS (NO CHARGE) OPTIME
TOPICAL | Status: DC | PRN
  Administered 2021-04-04: 1 via TOPICAL

## 2021-04-04 MED ORDER — ORAL CARE MOUTH RINSE: 15.0000 mL | Freq: Once | OROMUCOSAL | Status: AC

## 2021-04-04 MED ORDER — ALBUMIN HUMAN 5 % IV SOLN
250.0000 mL | INTRAVENOUS | Status: AC | PRN
  Administered 2021-04-05: 12.5 g via INTRAVENOUS
  Filled 2021-04-04: qty 250

## 2021-04-04 MED ORDER — CEFAZOLIN SODIUM-DEXTROSE 2-4 GM/100ML-% IV SOLN
2.0000 g | Freq: Three times a day (TID) | INTRAVENOUS | Status: AC
  Administered 2021-04-04 – 2021-04-06 (×6): 2 g via INTRAVENOUS
  Filled 2021-04-04 (×4): qty 100

## 2021-04-04 MED ORDER — METOPROLOL TARTRATE 25 MG/10 ML ORAL SUSPENSION: 12.5000 mg | Freq: Two times a day (BID) | ORAL | Status: DC

## 2021-04-04 MED ORDER — LACTATED RINGERS IV SOLN: INTRAVENOUS | Status: DC

## 2021-04-04 MED ORDER — METOPROLOL TARTRATE 12.5 MG HALF TABLET
12.5000 mg | ORAL_TABLET | Freq: Two times a day (BID) | ORAL | Status: DC
  Administered 2021-04-05: 12.5 mg via ORAL
  Filled 2021-04-04: qty 1

## 2021-04-04 MED ORDER — ~~LOC~~ CARDIAC SURGERY, PATIENT & FAMILY EDUCATION
Freq: Once | Status: DC
  Filled 2021-04-04: qty 1

## 2021-04-04 MED ORDER — DOCUSATE SODIUM 100 MG PO CAPS
200.0000 mg | ORAL_CAPSULE | Freq: Every day | ORAL | Status: DC
  Administered 2021-04-05 – 2021-04-09 (×5): 200 mg via ORAL
  Filled 2021-04-04 (×6): qty 2

## 2021-04-04 MED ORDER — ASPIRIN 81 MG PO CHEW: 324.0000 mg | CHEWABLE_TABLET | Freq: Every day | ORAL | Status: DC

## 2021-04-04 MED ORDER — SODIUM CHLORIDE 0.9% FLUSH: 3.0000 mL | INTRAVENOUS | Status: DC | PRN

## 2021-04-04 MED ORDER — ACETAMINOPHEN 160 MG/5ML PO SOLN: 650.0000 mg | Freq: Once | ORAL | Status: AC

## 2021-04-04 MED ORDER — SODIUM CHLORIDE (PF) 0.9 % IJ SOLN
INTRAMUSCULAR | Status: DC | PRN
  Administered 2021-04-04 (×3): 4 mL via TOPICAL

## 2021-04-04 MED ORDER — PANTOPRAZOLE SODIUM 40 MG PO TBEC
40.0000 mg | DELAYED_RELEASE_TABLET | Freq: Every day | ORAL | Status: DC
  Administered 2021-04-06 – 2021-04-11 (×6): 40 mg via ORAL
  Filled 2021-04-04 (×6): qty 1

## 2021-04-04 MED ORDER — PHENYLEPHRINE HCL-NACL 20-0.9 MG/250ML-% IV SOLN: 0.0000 ug/min | INTRAVENOUS | Status: DC

## 2021-04-04 MED ORDER — NITROGLYCERIN IN D5W 200-5 MCG/ML-% IV SOLN: 0.0000 ug/min | INTRAVENOUS | Status: DC

## 2021-04-04 MED ORDER — MUPIROCIN CALCIUM 2 % NA OINT
1.0000 "application " | TOPICAL_OINTMENT | Freq: Two times a day (BID) | NASAL | Status: DC
  Filled 2021-04-04: qty 1

## 2021-04-04 MED ORDER — INSULIN ASPART 100 UNIT/ML IJ SOLN
0.0000 [IU] | INTRAMUSCULAR | Status: DC
  Administered 2021-04-05 (×2): 2 [IU] via SUBCUTANEOUS

## 2021-04-04 MED ORDER — ROCURONIUM BROMIDE 10 MG/ML (PF) SYRINGE
PREFILLED_SYRINGE | INTRAVENOUS | Status: AC
  Filled 2021-04-04: qty 20

## 2021-04-04 MED ORDER — SODIUM CHLORIDE 0.9% FLUSH
10.0000 mL | Freq: Two times a day (BID) | INTRAVENOUS | Status: DC
  Administered 2021-04-04 – 2021-04-05 (×2): 10 mL
  Administered 2021-04-05: 30 mL

## 2021-04-04 MED ORDER — ALBUMIN HUMAN 5 % IV SOLN: INTRAVENOUS | Status: DC | PRN

## 2021-04-04 MED ORDER — TRAMADOL HCL 50 MG PO TABS
50.0000 mg | ORAL_TABLET | ORAL | Status: DC | PRN
  Administered 2021-04-05 – 2021-04-06 (×2): 100 mg via ORAL
  Filled 2021-04-04 (×2): qty 2

## 2021-04-04 MED ORDER — CHLORHEXIDINE GLUCONATE 0.12 % MT SOLN
15.0000 mL | OROMUCOSAL | Status: AC
  Administered 2021-04-04: 15 mL via OROMUCOSAL

## 2021-04-04 MED ORDER — DEXMEDETOMIDINE HCL IN NACL 400 MCG/100ML IV SOLN
0.0000 ug/kg/h | INTRAVENOUS | Status: DC
  Administered 2021-04-04: 0.2 ug/kg/h via INTRAVENOUS
  Filled 2021-04-04: qty 100

## 2021-04-04 MED ORDER — MIDAZOLAM HCL 2 MG/2ML IJ SOLN
INTRAMUSCULAR | Status: DC | PRN
  Administered 2021-04-04 (×3): 2 mg via INTRAVENOUS
  Administered 2021-04-04: 3 mg via INTRAVENOUS
  Administered 2021-04-04: 1 mg via INTRAVENOUS

## 2021-04-04 MED ORDER — MIDAZOLAM HCL (PF) 10 MG/2ML IJ SOLN
INTRAMUSCULAR | Status: AC
  Filled 2021-04-04: qty 2

## 2021-04-04 MED ORDER — MORPHINE SULFATE (PF) 2 MG/ML IV SOLN
1.0000 mg | INTRAVENOUS | Status: DC | PRN
  Administered 2021-04-04: 4 mg via INTRAVENOUS
  Administered 2021-04-04: 17:00:00 2 mg via INTRAVENOUS
  Administered 2021-04-04: 4 mg via INTRAVENOUS
  Administered 2021-04-05 – 2021-04-07 (×5): 2 mg via INTRAVENOUS
  Administered 2021-04-08: 4 mg via INTRAVENOUS
  Administered 2021-04-09 – 2021-04-10 (×2): 2 mg via INTRAVENOUS
  Filled 2021-04-04: qty 2
  Filled 2021-04-04 (×6): qty 1
  Filled 2021-04-04: qty 2
  Filled 2021-04-04 (×4): qty 1

## 2021-04-04 MED ORDER — SODIUM CHLORIDE 0.9% FLUSH: 3.0000 mL | Freq: Two times a day (BID) | INTRAVENOUS | Status: DC

## 2021-04-04 MED ORDER — HEPARIN SODIUM (PORCINE) 1000 UNIT/ML IJ SOLN
INTRAMUSCULAR | Status: AC
  Filled 2021-04-04: qty 1

## 2021-04-04 MED ORDER — SODIUM CHLORIDE 0.9% FLUSH: 10.0000 mL | INTRAVENOUS | Status: DC | PRN

## 2021-04-04 MED ORDER — ACETAMINOPHEN 650 MG RE SUPP
650.0000 mg | Freq: Once | RECTAL | Status: AC
  Administered 2021-04-04: 650 mg via RECTAL

## 2021-04-04 MED ORDER — ONDANSETRON HCL 4 MG/2ML IJ SOLN: 4.0000 mg | Freq: Four times a day (QID) | INTRAMUSCULAR | Status: DC | PRN

## 2021-04-04 MED ORDER — CHLORHEXIDINE GLUCONATE 0.12 % MT SOLN: 15.0000 mL | Freq: Once | OROMUCOSAL | Status: DC

## 2021-04-04 MED ORDER — ORAL CARE MOUTH RINSE
15.0000 mL | Freq: Two times a day (BID) | OROMUCOSAL | Status: DC
  Administered 2021-04-04 – 2021-04-11 (×10): 15 mL via OROMUCOSAL

## 2021-04-04 MED ORDER — MIDAZOLAM HCL 2 MG/2ML IJ SOLN
2.0000 mg | INTRAMUSCULAR | Status: DC | PRN
  Filled 2021-04-04: qty 2

## 2021-04-04 MED ORDER — LACTATED RINGERS IV SOLN: 500.0000 mL | Freq: Once | INTRAVENOUS | Status: DC | PRN

## 2021-04-04 MED ORDER — OXYCODONE HCL 5 MG PO TABS
5.0000 mg | ORAL_TABLET | ORAL | Status: DC | PRN
  Administered 2021-04-04: 10 mg via ORAL
  Administered 2021-04-05 (×2): 5 mg via ORAL
  Administered 2021-04-05 – 2021-04-06 (×4): 10 mg via ORAL
  Administered 2021-04-06 (×2): 5 mg via ORAL
  Administered 2021-04-06 – 2021-04-07 (×2): 10 mg via ORAL
  Administered 2021-04-07: 5 mg via ORAL
  Administered 2021-04-07 (×2): 10 mg via ORAL
  Administered 2021-04-07: 5 mg via ORAL
  Administered 2021-04-08 – 2021-04-11 (×12): 10 mg via ORAL
  Filled 2021-04-04 (×10): qty 2
  Filled 2021-04-04 (×2): qty 1
  Filled 2021-04-04 (×4): qty 2
  Filled 2021-04-04: qty 1
  Filled 2021-04-04 (×6): qty 2
  Filled 2021-04-04 (×2): qty 1
  Filled 2021-04-04 (×3): qty 2

## 2021-04-04 MED ORDER — CHLORHEXIDINE GLUCONATE 0.12 % MT SOLN
15.0000 mL | Freq: Once | OROMUCOSAL | Status: AC
  Administered 2021-04-04: 15 mL via OROMUCOSAL
  Filled 2021-04-04: qty 15

## 2021-04-04 MED ORDER — METOPROLOL TARTRATE 12.5 MG HALF TABLET
12.5000 mg | ORAL_TABLET | Freq: Once | ORAL | Status: DC
  Filled 2021-04-04: qty 1

## 2021-04-04 MED ORDER — ACETAMINOPHEN 500 MG PO TABS
1000.0000 mg | ORAL_TABLET | Freq: Four times a day (QID) | ORAL | Status: AC
  Administered 2021-04-05 – 2021-04-09 (×19): 1000 mg via ORAL
  Filled 2021-04-04 (×16): qty 2

## 2021-04-04 MED ORDER — LACTATED RINGERS IV SOLN: INTRAVENOUS | Status: DC | PRN

## 2021-04-04 MED ORDER — ARTIFICIAL TEARS OPHTHALMIC OINT
TOPICAL_OINTMENT | OPHTHALMIC | Status: DC | PRN
  Administered 2021-04-04: 1 via OPHTHALMIC

## 2021-04-04 MED ORDER — BISACODYL 5 MG PO TBEC
10.0000 mg | DELAYED_RELEASE_TABLET | Freq: Every day | ORAL | Status: DC
  Administered 2021-04-05 – 2021-04-09 (×5): 10 mg via ORAL
  Filled 2021-04-04 (×5): qty 2

## 2021-04-04 MED ORDER — VANCOMYCIN HCL IN DEXTROSE 1-5 GM/200ML-% IV SOLN
1000.0000 mg | Freq: Once | INTRAVENOUS | Status: AC
  Administered 2021-04-04: 1000 mg via INTRAVENOUS
  Filled 2021-04-04: qty 200

## 2021-04-04 MED ORDER — SODIUM CHLORIDE 0.9 % IV SOLN: INTRAVENOUS | Status: DC

## 2021-04-04 MED ORDER — DEXTROSE 50 % IV SOLN: 0.0000 mL | INTRAVENOUS | Status: DC | PRN

## 2021-04-04 MED ORDER — BISACODYL 10 MG RE SUPP: 10.0000 mg | Freq: Every day | RECTAL | Status: DC

## 2021-04-04 MED ORDER — PROPOFOL 10 MG/ML IV BOLUS
INTRAVENOUS | Status: DC | PRN
  Administered 2021-04-04: 50 mg via INTRAVENOUS
  Administered 2021-04-04: 100 mg via INTRAVENOUS

## 2021-04-04 MED ORDER — FENTANYL CITRATE (PF) 250 MCG/5ML IJ SOLN
INTRAMUSCULAR | Status: DC | PRN
  Administered 2021-04-04: 50 ug via INTRAVENOUS
  Administered 2021-04-04: 100 ug via INTRAVENOUS
  Administered 2021-04-04: 50 ug via INTRAVENOUS
  Administered 2021-04-04 (×2): 100 ug via INTRAVENOUS
  Administered 2021-04-04: 200 ug via INTRAVENOUS
  Administered 2021-04-04: 100 ug via INTRAVENOUS
  Administered 2021-04-04: 200 ug via INTRAVENOUS
  Administered 2021-04-04 (×2): 100 ug via INTRAVENOUS
  Administered 2021-04-04: 150 ug via INTRAVENOUS

## 2021-04-04 MED ORDER — MUPIROCIN 2 % EX OINT
TOPICAL_OINTMENT | Freq: Two times a day (BID) | CUTANEOUS | Status: DC
  Administered 2021-04-06: 1 via NASAL
  Filled 2021-04-04 (×4): qty 22

## 2021-04-04 MED ORDER — SODIUM CHLORIDE 0.9 % IV SOLN: 250.0000 mL | INTRAVENOUS | Status: DC

## 2021-04-04 MED ORDER — ACETAMINOPHEN 160 MG/5ML PO SOLN: 1000.0000 mg | Freq: Four times a day (QID) | ORAL | Status: AC

## 2021-04-04 MED ORDER — KETOROLAC TROMETHAMINE 30 MG/ML IJ SOLN
30.0000 mg | Freq: Four times a day (QID) | INTRAMUSCULAR | Status: AC | PRN
  Administered 2021-04-04 – 2021-04-08 (×8): 30 mg via INTRAVENOUS
  Filled 2021-04-04 (×7): qty 1

## 2021-04-04 MED ORDER — ASPIRIN EC 325 MG PO TBEC: 325.0000 mg | DELAYED_RELEASE_TABLET | Freq: Every day | ORAL | Status: DC

## 2021-04-04 MED ORDER — HEPARIN SODIUM (PORCINE) 1000 UNIT/ML IJ SOLN
INTRAMUSCULAR | Status: DC | PRN
  Administered 2021-04-04: 29000 [IU] via INTRAVENOUS

## 2021-04-04 MED ORDER — POTASSIUM CHLORIDE 10 MEQ/50ML IV SOLN: 10.0000 meq | INTRAVENOUS | Status: AC

## 2021-04-04 MED ORDER — ROCURONIUM BROMIDE 10 MG/ML (PF) SYRINGE
PREFILLED_SYRINGE | INTRAVENOUS | Status: AC
  Filled 2021-04-04: qty 10

## 2021-04-04 MED ORDER — 0.9 % SODIUM CHLORIDE (POUR BTL) OPTIME
TOPICAL | Status: DC | PRN
  Administered 2021-04-04: 5000 mL
  Administered 2021-04-04: 1000 mL

## 2021-04-04 MED ORDER — FAMOTIDINE IN NACL 20-0.9 MG/50ML-% IV SOLN
20.0000 mg | Freq: Two times a day (BID) | INTRAVENOUS | Status: AC
  Administered 2021-04-04: 20 mg via INTRAVENOUS
  Filled 2021-04-04 (×2): qty 50

## 2021-04-04 MED ORDER — METOPROLOL TARTRATE 5 MG/5ML IV SOLN
2.5000 mg | INTRAVENOUS | Status: DC | PRN
  Administered 2021-04-08: 5 mg via INTRAVENOUS
  Filled 2021-04-04 (×2): qty 5

## 2021-04-04 MED ORDER — PHENYLEPHRINE 40 MCG/ML (10ML) SYRINGE FOR IV PUSH (FOR BLOOD PRESSURE SUPPORT)
PREFILLED_SYRINGE | INTRAVENOUS | Status: AC
  Filled 2021-04-04: qty 10

## 2021-04-04 MED ORDER — PROTAMINE SULFATE 10 MG/ML IV SOLN
INTRAVENOUS | Status: DC | PRN
  Administered 2021-04-04: 290 mg via INTRAVENOUS

## 2021-04-04 MED ORDER — MAGNESIUM SULFATE 4 GM/100ML IV SOLN
4.0000 g | Freq: Once | INTRAVENOUS | Status: AC
  Administered 2021-04-04: 4 g via INTRAVENOUS
  Filled 2021-04-04: qty 100

## 2021-04-04 MED ORDER — SODIUM CHLORIDE 0.45 % IV SOLN: INTRAVENOUS | Status: DC | PRN

## 2021-04-04 MED ORDER — CHLORHEXIDINE GLUCONATE 4 % EX LIQD: 30.0000 mL | CUTANEOUS | Status: DC

## 2021-04-04 SURGICAL SUPPLY — 72 items
ADAPTER CARDIO PERF ANTE/RETRO (ADAPTER) ×3 IMPLANT
APPLICATOR COTTON TIP 6 STRL (MISCELLANEOUS) IMPLANT
APPLICATOR COTTON TIP 6IN STRL (MISCELLANEOUS) ×6
BAG DECANTER FOR FLEXI CONT (MISCELLANEOUS) ×3 IMPLANT
BLADE CLIPPER SURG (BLADE) ×3 IMPLANT
BLADE SURG 15 STRL LF DISP TIS (BLADE) ×2 IMPLANT
BLADE SURG 15 STRL SS (BLADE) ×2
CANISTER SUCT 3000ML PPV (MISCELLANEOUS) ×3 IMPLANT
CANNULA EZ GLIDE AORTIC 21FR (CANNULA) ×3 IMPLANT
CANNULA GUNDRY RCSP 15FR (MISCELLANEOUS) ×3 IMPLANT
CATH CPB KIT HENDRICKSON (MISCELLANEOUS) ×3 IMPLANT
CATH HEART VENT LEFT (CATHETERS) ×2 IMPLANT
CATH ROBINSON RED A/P 18FR (CATHETERS) ×6 IMPLANT
CATH THORACIC 36FR RT ANG (CATHETERS) ×5 IMPLANT
CNTNR URN SCR LID CUP LEK RST (MISCELLANEOUS) IMPLANT
CONN ST 1/4X3/8  BEN (MISCELLANEOUS) ×2
CONN ST 1/4X3/8 BEN (MISCELLANEOUS) IMPLANT
CONT SPEC 4OZ STRL OR WHT (MISCELLANEOUS) ×2
CONTAINER PROTECT SURGISLUSH (MISCELLANEOUS) ×6 IMPLANT
DRAPE WARM FLUID 44X44 (DRAPES) ×3 IMPLANT
DRSG COVADERM 4X14 (GAUZE/BANDAGES/DRESSINGS) ×3 IMPLANT
ELECT REM PT RETURN 9FT ADLT (ELECTROSURGICAL) ×6
ELECTRODE REM PT RTRN 9FT ADLT (ELECTROSURGICAL) ×4 IMPLANT
FELT TEFLON 1X6 (MISCELLANEOUS) ×6 IMPLANT
GAUZE 4X4 16PLY ~~LOC~~+RFID DBL (SPONGE) ×3 IMPLANT
GAUZE SPONGE 4X4 12PLY STRL (GAUZE/BANDAGES/DRESSINGS) ×5 IMPLANT
GLOVE SURG MICRO LTX SZ6.5 (GLOVE) ×1 IMPLANT
GLOVE SURG SIGNA 7.5 PF LTX (GLOVE) ×9 IMPLANT
GOWN STRL REUS W/ TWL LRG LVL3 (GOWN DISPOSABLE) ×8 IMPLANT
GOWN STRL REUS W/ TWL XL LVL3 (GOWN DISPOSABLE) ×2 IMPLANT
GOWN STRL REUS W/TWL LRG LVL3 (GOWN DISPOSABLE) ×8
GOWN STRL REUS W/TWL XL LVL3 (GOWN DISPOSABLE) ×2
HEMOSTAT POWDER SURGIFOAM 1G (HEMOSTASIS) ×9 IMPLANT
HEMOSTAT SURGICEL 2X14 (HEMOSTASIS) ×3 IMPLANT
IV CATH 18G X1.75 CATHLON (IV SOLUTION) ×1 IMPLANT
KIT BASIN OR (CUSTOM PROCEDURE TRAY) ×3 IMPLANT
KIT SUCTION CATH 14FR (SUCTIONS) ×6 IMPLANT
KIT TURNOVER KIT B (KITS) ×3 IMPLANT
LINE VENT (MISCELLANEOUS) ×1 IMPLANT
NS IRRIG 1000ML POUR BTL (IV SOLUTION) ×16 IMPLANT
PACK E OPEN HEART (SUTURE) ×3 IMPLANT
PACK OPEN HEART (CUSTOM PROCEDURE TRAY) ×3 IMPLANT
PAD ARMBOARD 7.5X6 YLW CONV (MISCELLANEOUS) ×6 IMPLANT
POSITIONER HEAD DONUT 9IN (MISCELLANEOUS) ×3 IMPLANT
SET MPS 3-ND DEL (MISCELLANEOUS) ×1 IMPLANT
SPONGE T-LAP 18X18 ~~LOC~~+RFID (SPONGE) ×12 IMPLANT
SPONGE T-LAP 4X18 ~~LOC~~+RFID (SPONGE) ×3 IMPLANT
SUT BONE WAX W31G (SUTURE) ×3 IMPLANT
SUT EB EXC GRN/WHT 2-0 V-5 (SUTURE) ×6 IMPLANT
SUT ETHIBOND 2 0 SH (SUTURE) ×6
SUT ETHIBOND 2 0 SH 36X2 (SUTURE) ×2 IMPLANT
SUT PROLENE 3 0 SH DA (SUTURE) ×3 IMPLANT
SUT PROLENE 4 0 RB 1 (SUTURE) ×11
SUT PROLENE 4-0 RB1 .5 CRCL 36 (SUTURE) ×4 IMPLANT
SUT PROLENE 4-0 RB1 18X2 ARM (SUTURE) IMPLANT
SUT PROLENE 5 0 C 1 36 (SUTURE) ×1 IMPLANT
SUT SILK  1 MH (SUTURE) ×9
SUT SILK 1 MH (SUTURE) ×2 IMPLANT
SUT VIC AB 1 CTX 36 (SUTURE) ×6
SUT VIC AB 1 CTX36XBRD ANBCTR (SUTURE) ×4 IMPLANT
SUT VIC AB 2-0 CTX 36 (SUTURE) ×1 IMPLANT
SUT VIC AB 3-0 X1 27 (SUTURE) ×2 IMPLANT
SYSTEM SAHARA CHEST DRAIN ATS (WOUND CARE) ×3 IMPLANT
TAPE CLOTH SURG 4X10 WHT LF (GAUZE/BANDAGES/DRESSINGS) ×1 IMPLANT
TAPE PAPER 2X10 WHT MICROPORE (GAUZE/BANDAGES/DRESSINGS) ×1 IMPLANT
TOWEL GREEN STERILE (TOWEL DISPOSABLE) ×3 IMPLANT
TOWEL GREEN STERILE FF (TOWEL DISPOSABLE) ×3 IMPLANT
TRAY FOLEY SLVR 16FR TEMP STAT (SET/KITS/TRAYS/PACK) ×3 IMPLANT
UNDERPAD 30X36 HEAVY ABSORB (UNDERPADS AND DIAPERS) ×3 IMPLANT
VALVE ON-X AORTIC 23MM (Prosthesis & Implant Heart) ×1 IMPLANT
VENT LEFT HEART 12002 (CATHETERS) ×3
WATER STERILE IRR 1000ML POUR (IV SOLUTION) ×6 IMPLANT

## 2021-04-04 NOTE — Anesthesia Procedure Notes (Signed)
Arterial Line Insertion Start/End2/22/2023 6:55 AM, 04/04/2021 7:00 AM Performed by: CRNA  Patient location: Pre-op. Preanesthetic checklist: patient identified, IV checked, site marked, risks and benefits discussed, surgical consent, monitors and equipment checked, pre-op evaluation, timeout performed and anesthesia consent Lidocaine 1% used for infiltration radial was placed Catheter size: 20 G Hand hygiene performed  and maximum sterile barriers used   Attempts: 1 Procedure performed without using ultrasound guided technique. Following insertion, Biopatch and dressing applied. Post procedure assessment: normal  Patient tolerated the procedure well with no immediate complications.

## 2021-04-04 NOTE — Progress Notes (Signed)
EVENING ROUNDS NOTE :     [address]       [place] [address]             [phone]                 Day of Surgery Procedure(s) (LRB): AORTIC VALVE REPLACEMENT (AVR) USING ON-X VALVE SIZE 23MM (N/A) TRANSESOPHAGEAL ECHOCARDIOGRAM (TEE) (N/A)   Total Length of Stay:  LOS: 0 days  Events:   Extubated Minimal CT output Good hemodynamics    BP 107/79    Pulse 69    Temp 99.1 F (37.3 C)    Resp 14    Ht 6\' 2"  (1.88 m)    Wt 82.1 kg    SpO2 99%    BMI 23.24 kg/m   PAP: (16-32)/(3-20) 22/6 CO:  [3.4 L/min-4 L/min] 3.9 L/min CI:  [1.6 L/min/m2-1.9 L/min/m2] 1.9 L/min/m2  Vent Mode: PSV;CPAP FiO2 (%):  [40 %-50 %] 40 % Set Rate:  [4 bmp-12 bmp] 4 bmp Vt Set:  [650 mL] 650 mL PEEP:  [5 cmH20] 5 cmH20 Pressure Support:  [10 cmH20] 10 cmH20   sodium chloride Stopped (04/04/21 1441)   [START ON 04/05/2021] sodium chloride     sodium chloride 10 mL/hr at 04/04/21 1256   albumin human      ceFAZolin (ANCEF) IV Stopped (04/04/21 1511)   dexmedetomidine (PRECEDEX) IV infusion Stopped (04/04/21 1515)   famotidine (PEPCID) IV Stopped (04/04/21 1341)   lactated ringers     lactated ringers     lactated ringers 20 mL/hr at 04/04/21 1300   magnesium sulfate 20 mL/hr at 04/04/21 1700   nitroGLYCERIN Stopped (04/04/21 1602)   phenylephrine (NEO-SYNEPHRINE) Adult infusion 10 mcg/min (04/04/21 1700)   vancomycin      No intake/output data recorded.   CBC Latest Ref Rng & Units 04/04/2021 04/04/2021 04/04/2021  WBC 4.0 - 10.5 K/uL 11.8(H) - -  Hemoglobin 13.0 - 17.0 g/dL 11.5(L) 11.0(L) 13.9  Hematocrit 39.0 - 52.0 % 33.2(L) 31.7(L) 41.0  Platelets 150 - 400 K/uL 81(L) 118(L) -    BMP Latest Ref Rng & Units 04/04/2021 04/04/2021 04/03/2021  Glucose 70 - 99 mg/dL - 119(H) 101(H)  BUN 6 - 20 mg/dL - 13 8  Creatinine 0.61 - 1.24 mg/dL - 0.70 0.92  Sodium 135 - 145 mmol/L 138 139 135  Potassium 3.5 - 5.1 mmol/L 4.4 4.5 4.2  Chloride 98 - 111 mmol/L - 105 106  CO2  22 - 32 mmol/L - - 20(L)  Calcium 8.9 - 10.3 mg/dL - - 9.4    ABG    Component Value Date/Time   PHART 7.354 04/04/2021 0805   PCO2ART 46.2 04/04/2021 0805   PO2ART 378 (H) 04/04/2021 0805   HCO3 25.7 04/04/2021 0805   TCO2 27 04/04/2021 0805   ACIDBASEDEF 0.3 04/03/2021 1154   O2SAT 100 04/04/2021 0805       Melodie Bouillon, MD 04/04/2021 5:16 PM

## 2021-04-04 NOTE — Anesthesia Procedure Notes (Signed)
Central Venous Catheter Insertion Performed by: Murvin Natal, MD, anesthesiologist Start/End2/22/2023 6:45 AM, 04/04/2021 7:00 AM Patient location: Pre-op. Preanesthetic checklist: patient identified, IV checked, site marked, risks and benefits discussed, surgical consent, monitors and equipment checked, pre-op evaluation, timeout performed and anesthesia consent Position: Trendelenburg Lidocaine 1% used for infiltration and patient sedated Hand hygiene performed , maximum sterile barriers used  and Seldinger technique used Catheter size: 9 Fr Total catheter length 12. PA cath was placed.MAC introducer Swan type:thermodilution PA Cath depth:49 Procedure performed using ultrasound guided technique. Ultrasound Notes:anatomy identified, needle tip was noted to be adjacent to the nerve/plexus identified and no ultrasound evidence of intravascular and/or intraneural injection Attempts: 1 Following insertion, line sutured, dressing applied and Biopatch. Post procedure assessment: blood return through all ports, free fluid flow and no air  Patient tolerated the procedure well with no immediate complications.

## 2021-04-04 NOTE — Discharge Instructions (Addendum)
Discharge Instructions:  1. You may shower, please wash incisions daily with soap and water and keep dry.  If you wish to cover wounds with dressing you may do so but please keep clean and change daily.  No tub baths or swimming until incisions have completely healed.  If your incisions become red or develop any drainage please call our office at [phone]  2. No Driving until cleared by Dr. Leonarda Salon  office and you are no longer using narcotic pain medications  3. Monitor your weight daily.. Please use the same scale and weigh at same time... If you gain 5-10 lbs in 48 hours with associated lower extremity swelling, please contact our office at [phone]  4. Fever of 101.5 for at least 24 hours with no source, please contact our office at [phone]  5. Activity- up as tolerated, please walk at least 3 times per day.  Avoid strenuous activity, no lifting, pushing, or pulling with your arms over 8-10 lbs for a minimum of 6 weeks  6. If any questions or concerns arise, please do not hesitate to contact our office at [phone]   Information on my medicine - Coumadin   (Warfarin)  This medication education was reviewed with me or my healthcare representative as part of my discharge preparation.   Why was Coumadin prescribed for you? Coumadin was prescribed for you because you have a blood clot or a medical condition that can cause an increased risk of forming blood clots. Blood clots can cause serious health problems by blocking the flow of blood to the heart, lung, or brain. Coumadin can prevent harmful blood clots from forming. As a reminder your indication for Coumadin is:  Blood Clot Prevention after Heart Valve Surgery  What test will check on my response to Coumadin? While on Coumadin (warfarin) you will need to have an INR test regularly to ensure that your dose is keeping you in the desired range. The INR (international normalized ratio) number is calculated from the  result of the laboratory test called prothrombin time (PT).  If an INR APPOINTMENT HAS NOT ALREADY BEEN MADE FOR YOU please schedule an appointment to have this lab work done by your health care provider within 7 days. Your INR goal is usually a number between:  2 to 3 or your provider may give you a more narrow range like 2-2.5.  Ask your health care provider during an office visit what your goal INR is.  What  do you need to  know  About  COUMADIN? Take Coumadin (warfarin) exactly as prescribed by your healthcare provider about the same time each day.  DO NOT stop taking without talking to the doctor who prescribed the medication.  Stopping without other blood clot prevention medication to take the place of Coumadin may increase your risk of developing a new clot or stroke.  Get refills before you run out.  What do you do if you miss a dose? If you miss a dose, take it as soon as you remember on the same day then continue your regularly scheduled regimen the next day.  Do not take two doses of Coumadin at the same time.  Important Safety Information A possible side effect of Coumadin (Warfarin) is an increased risk of bleeding. You should call your healthcare provider right away if you experience any of the following: Bleeding from an injury or your nose that does not stop. Unusual colored urine (red or dark brown) or unusual colored stools (red or black).  Unusual bruising for unknown reasons. A serious fall or if you hit your head (even if there is no bleeding).  Some foods or medicines interact with Coumadin (warfarin) and might alter your response to warfarin. To help avoid this: Eat a balanced diet, maintaining a consistent amount of Vitamin K. Notify your provider about major diet changes you plan to make. Avoid alcohol or limit your intake to 1 drink for women and 2 drinks for men per day. (1 drink is 5 oz. wine, 12 oz. beer, or 1.5 oz. liquor.)  Make sure that ANY health care  provider who prescribes medication for you knows that you are taking Coumadin (warfarin).  Also make sure the healthcare provider who is monitoring your Coumadin knows when you have started a new medication including herbals and non-prescription products.  Coumadin (Warfarin)  Major Drug Interactions  Increased Warfarin Effect Decreased Warfarin Effect  Alcohol (large quantities) Antibiotics (esp. Septra/Bactrim, Flagyl, Cipro) Amiodarone (Cordarone) Aspirin (ASA) Cimetidine (Tagamet) Megestrol (Megace) NSAIDs (ibuprofen, naproxen, etc.) Piroxicam (Feldene) Propafenone (Rythmol SR) Propranolol (Inderal) Isoniazid (INH) Posaconazole (Noxafil) Barbiturates (Phenobarbital) Carbamazepine (Tegretol) Chlordiazepoxide (Librium) Cholestyramine (Questran) Griseofulvin Oral Contraceptives Rifampin Sucralfate (Carafate) Vitamin K   Coumadin (Warfarin) Major Herbal Interactions  Increased Warfarin Effect Decreased Warfarin Effect  Garlic Ginseng Ginkgo biloba Coenzyme Q10 Green tea St. Johns wort    Coumadin (Warfarin) FOOD Interactions  Eat a consistent number of servings per week of foods HIGH in Vitamin K (1 serving =  cup)  Collards (cooked, or boiled & drained) Kale (cooked, or boiled & drained) Mustard greens (cooked, or boiled & drained) Parsley *serving size only =  cup Spinach (cooked, or boiled & drained) Swiss chard (cooked, or boiled & drained) Turnip greens (cooked, or boiled & drained)  Eat a consistent number of servings per week of foods MEDIUM-HIGH in Vitamin K (1 serving = 1 cup)  Asparagus (cooked, or boiled & drained) Broccoli (cooked, boiled & drained, or raw & chopped) Brussel sprouts (cooked, or boiled & drained) *serving size only =  cup Lettuce, raw (green leaf, endive, romaine) Spinach, raw Turnip greens, raw & chopped   These websites have more information on Coumadin (warfarin):  FailFactory.se; VeganReport.com.au;

## 2021-04-04 NOTE — Hospital Course (Addendum)
HPI: This is a 56 year old man with a history of a heart murmur and a bicuspid aortic valve.  He also has a history of an enlarged prostate.  He has been followed with serial echocardiograms.  In October 2022 had an echocardiogram which showed progression of his aortic stenosis to severe with a valve area calculated 0.9 cm.  He had mild to moderate AI and it was felt that his valve area may have been overestimated.  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  He has been having difficulty with fatigue and shortness of breath with exertion.  He also notes that if he gets up quickly to go to the bathroom he sometimes sees spots and feels a little dizzy.  He has not had any syncope.  He denies chest pain,pressure, or tightness.  No peripheral edema.  Dr. Roxan Hockey and the patient discussed the options of surgical AVR versus TAVR, and also the options of mechanical versus tissue valves.  They discussed the relative advantages and disadvantages of each. TAVR or tissue valve would avoid that, but he would be at high risk for early failure and need for repeat procedures. Patient wishes to have a mechanical valve replacement and he understands that requires lifelong anticoagulation. Potential risks, benefits, and complications of the surgery were discussed with the patient and he agreed to proceed. Of note, he had two teeth removed and the remaining teeth checked about a month ago.  He is good to go from a dental standpoint.  Hospital Course:    Patient underwent a median sternotomy for aortic valve replacement using an On X mechanical valve. He was transported from the OR to [facility] ICU in stable condition. He was extubated early afternoon on 02/22. Post op EKG showed anterior ST elevation;no change from pre op EKG. Gordy Councilman and chest tubes were removed on post op day one. Once Neo Synephrine drip stopped, a line was removed. Ec asa was decreased to 81 mg daily and he was started on Coumadin for  a mechanical aortic valve. PT and INR were monitored daily. He was put on Toprol Xl 25 mg daily. He continued to maintain SR. He was transitioned off the Insulin drip. His pre op HGA1C was 5.4. Accu checks and SS PRN will be stopped upon transfer. He had thrombocytopenia post op. He had no evidence of bleeding. Platelets went as low as 79K.  Trending higher and then resolved with most recent value on 04/09/2021 of 187,000. The patient has developed postoperative atrial fibrillation and was placed on a amiodarone drip.  He was also on Toprol XL 25 mg daily.This was transitioned to oral as he converted to mostly sinus rhythm on 02/27. INR remained 1.4 on 02/27. He was given 7.5 mg of Coumadin the evening of 02/27 and 02/28. Epicardial pacing wires were removed on 02/28. He had sternal pain post op. He was given a Lidocaine patch, Oxy 5-10 mg Q 3 hours PRN, and Flexeril for muscle spasms 5 mg bid PRN. He has been ambulating on room air with good oxygenation. He has been tolerating a diet and has had a bowel movement. His wound is clean, dry, healing without signs of infection. He did have an infiltrated IV in his right forearm (IV Amiodarone infused) and it was swollen and painful. There was no erythema so he was not put on an antibiotic. Initially, heat was used followed by ice. He had less swelling and somewhat less pain on 03/01. INR this  am 03/01 is up to 2.2. As discussed with Dr. Roxan Hockey, will continue with Coumadin 5 mg. He has a PT/INR appointment for the am (no appointment available on Friday in [place]). As discussed with Dr. Roxan Hockey, patient is felt surgically stable for discharge today.

## 2021-04-04 NOTE — Progress Notes (Signed)
°  Echocardiogram Echocardiogram Transesophageal has been performed.  Bobbye Charleston 04/04/2021, 8:42 AM

## 2021-04-04 NOTE — Progress Notes (Signed)
°  Transition of Care [facility]) Screening Note   Patient Details  Name: Isaac Diaz Date of Birth: 06-Oct-1965   Transition of Care [facility]) CM/SW Contact:    Delilah Shan, LCSWA Phone Number: 04/04/2021, 2:10 PM    Transition of Care Department [facility]) has reviewed patient and no TOC needs have been identified at this time. We will continue to monitor patient advancement through interdisciplinary progression rounds. If new patient transition needs arise, please place a TOC consult.

## 2021-04-04 NOTE — Procedures (Signed)
Extubation Procedure Note  Patient Details:   Name: Isaac Diaz DOB: 06/15/1965 MRN: [id number]   Airway Documentation:    Vent end date: 04/04/21 Vent end time: 1522   Evaluation  O2 sats: stable throughout Complications: No apparent complications Patient did tolerate procedure well. Bilateral Breath Sounds: Clear, Diminished   Yes  Pt extubated per rapid wean protocol. Pt with positive cuff leak, NIF -30, and VC . Pt suctioned orally and via ETT. Pt extubated to 2L [place]. No stridor heard, Pt able to give good cough and speak name. Rt will continue to monitor.  Candise Che 04/04/2021, 3:33 PM

## 2021-04-04 NOTE — Brief Op Note (Signed)
04/04/2021  10:43 AM  PATIENT:  Isaac Diaz  56 y.o. male  PRE-OPERATIVE DIAGNOSIS:  1. SEVERE AORTIC STENOSIS 2. MILD to MODERATE AORTIC INSUFFICIENCY  POST-OPERATIVE DIAGNOSIS:  1. SEVERE AORTIC STENOSIS 2. MILD to MODERATE AORTIC INSUFFICIENCY  PROCEDURE:TRANSESOPHAGEAL ECHOCARDIOGRAM(TEE),  AORTIC VALVE REPLACEMENT (AVR) USING (ON-X VALVE  MECHANICAL VALVE, SERIAL # [id number], SIZE 23 MM), APPLICATION OF CELL SAVER  SURGEON:  Surgeon(s) and Role:    Melrose Nakayama, MD - Primary  PHYSICIAN ASSISTANT: Lars Pinks PA-C  ASSISTANTS: Despina Arias RNFA   ANESTHESIA:   general  EBL:  Per anesthesia, perfusion record  DRAINS:  Chest tubes placed in the mediastinal and pleural spaces    SPECIMEN:  Source of Specimen:  Native AV leaflets  DISPOSITION OF SPECIMEN:   Pathology and culture  COUNTS CORRECT:  YES  DICTATION: .Dragon Dictation  PLAN OF CARE: Admit to inpatient   PATIENT DISPOSITION:  ICU - intubated and hemodynamically stable.   Delay start of Pharmacological VTE agent (>24hrs) due to surgical blood loss or risk of bleeding: yes  BASELINE WEIGHT: 82.1 kg

## 2021-04-04 NOTE — Interval H&P Note (Signed)
History and Physical Interval Note:  04/04/2021 7:25 AM  Isaac Diaz  has presented today for surgery, with the diagnosis of AS.  The various methods of treatment have been discussed with the patient and family. After consideration of risks, benefits and other options for treatment, the patient has consented to  Procedure(s) with comments: AORTIC VALVE REPLACEMENT (AVR) (N/A) - mechanical TRANSESOPHAGEAL ECHOCARDIOGRAM (TEE) (N/A) as a surgical intervention.  The patient's history has been reviewed, patient examined, no change in status, stable for surgery.  I have reviewed the patient's chart and labs.  Questions were answered to the patient's satisfaction.     Loreli Slot

## 2021-04-04 NOTE — Anesthesia Postprocedure Evaluation (Signed)
Anesthesia Post Note  Patient: Isaac Diaz  Procedure(s) Performed: AORTIC VALVE REPLACEMENT (AVR) USING ON-X VALVE SIZE (Chest) TRANSESOPHAGEAL ECHOCARDIOGRAM (TEE)     Patient location during evaluation: SICU Anesthesia Type: General Level of consciousness: sedated Pain management: pain level controlled Vital Signs Assessment: post-procedure vital signs reviewed and stable Respiratory status: patient remains intubated per anesthesia plan Cardiovascular status: stable Postop Assessment: no apparent nausea or vomiting Anesthetic complications: no   No notable events documented.  Last Vitals:  Vitals:   04/04/21 1455 04/04/21 1500  BP: (!) 117/59 109/83  Pulse: 69 69  Resp: 15 15  Temp: 36.9 C 36.9 C  SpO2: 100%     Last Pain:  Vitals:   04/04/21 1430  TempSrc:   PainSc: Asleep                 Chancie Lampert P Rivky Cl[place]ning

## 2021-04-04 NOTE — Transfer of Care (Signed)
Immediate Anesthesia Transfer of Care Note  Patient: Isaac Diaz  Procedure(s) Performed: AORTIC VALVE REPLACEMENT (AVR) USING ON-X VALVE SIZE (Chest) TRANSESOPHAGEAL ECHOCARDIOGRAM (TEE)  Patient Location: SICU  Anesthesia Type:General  Level of Consciousness: Patient remains intubated per anesthesia plan  Airway & Oxygen Therapy: Patient remains intubated per anesthesia plan  Post-op Assessment: Report given to RN and Post -op Vital signs reviewed and stable  Post vital signs: Reviewed and stable  Last Vitals:  Vitals Value Taken Time  BP 116/87 04/04/21 1215  Temp    Pulse 69 04/04/21 1223  Resp 12 04/04/21 1223  SpO2 100   Vitals shown include unvalidated device data.  Last Pain:  Vitals:   04/04/21 0551  TempSrc:   PainSc: 0-No pain         Complications: No notable events documented.

## 2021-04-04 NOTE — Anesthesia Procedure Notes (Signed)
Procedure Name: Intubation Date/Time: 04/04/2021 7:53 AM Performed by: Lorie Phenix, CRNA Pre-anesthesia Checklist: Patient identified, Emergency Drugs available, Suction available and Patient being monitored Patient Re-evaluated:Patient Re-evaluated prior to induction Oxygen Delivery Method: Circle system utilized Preoxygenation: Pre-oxygenation with 100% oxygen Induction Type: IV induction Ventilation: Mask ventilation without difficulty and Oral airway inserted - appropriate to patient size Laryngoscope Size: Mac and 4 Grade View: Grade I Tube type: Oral Tube size: 8.0 mm Number of attempts: 1 Airway Equipment and Method: Stylet Placement Confirmation: ETT inserted through vocal cords under direct vision, positive ETCO2 and breath sounds checked- equal and bilateral Secured at: 24 cm Tube secured with: Tape Dental Injury: Teeth and Oropharynx as per pre-operative assessment

## 2021-04-05 ENCOUNTER — Encounter (HOSPITAL_COMMUNITY): Payer: Self-pay | Admitting: Thoracic Surgery (Cardiothoracic Vascular Surgery)

## 2021-04-05 ENCOUNTER — Inpatient Hospital Stay (HOSPITAL_COMMUNITY): Payer: Managed Care, Other (non HMO)

## 2021-04-05 LAB — GLUCOSE, CAPILLARY
Glucose-Capillary: 105 mg/dL — ABNORMAL HIGH (ref 70–99)
Glucose-Capillary: 111 mg/dL — ABNORMAL HIGH (ref 70–99)
Glucose-Capillary: 114 mg/dL — ABNORMAL HIGH (ref 70–99)
Glucose-Capillary: 115 mg/dL — ABNORMAL HIGH (ref 70–99)
Glucose-Capillary: 129 mg/dL — ABNORMAL HIGH (ref 70–99)
Glucose-Capillary: 140 mg/dL — ABNORMAL HIGH (ref 70–99)
Glucose-Capillary: 99 mg/dL (ref 70–99)

## 2021-04-05 LAB — POCT I-STAT 7, (LYTES, BLD GAS, ICA,H+H)
Acid-Base Excess: 0 mmol/L (ref 0.0–2.0)
Acid-Base Excess: 0 mmol/L (ref 0.0–2.0)
Acid-Base Excess: 0 mmol/L (ref 0.0–2.0)
Acid-base deficit: 1 mmol/L (ref 0.0–2.0)
Acid-base deficit: 2 mmol/L (ref 0.0–2.0)
Bicarbonate: 25.5 mmol/L (ref 20.0–28.0)
Bicarbonate: 25.5 mmol/L (ref 20.0–28.0)
Bicarbonate: 26 mmol/L (ref 20.0–28.0)
Bicarbonate: 24.7 mmol/L (ref 20.0–28.0)
Bicarbonate: 25.2 mmol/L (ref 20.0–28.0)
Calcium, Ion: 1.06 mmol/L — ABNORMAL LOW (ref 1.15–1.40)
Calcium, Ion: 1.11 mmol/L — ABNORMAL LOW (ref 1.15–1.40)
Calcium, Ion: 1.11 mmol/L — ABNORMAL LOW (ref 1.15–1.40)
Calcium, Ion: 1.1 mmol/L — ABNORMAL LOW (ref 1.15–1.40)
Calcium, Ion: 1.12 mmol/L — ABNORMAL LOW (ref 1.15–1.40)
HCT: 32 % — ABNORMAL LOW (ref 39.0–52.0)
HCT: 32 % — ABNORMAL LOW (ref 39.0–52.0)
HCT: 32 % — ABNORMAL LOW (ref 39.0–52.0)
HCT: 32 % — ABNORMAL LOW (ref 39.0–52.0)
HCT: 32 % — ABNORMAL LOW (ref 39.0–52.0)
Hemoglobin: 10.9 g/dL — ABNORMAL LOW (ref 13.0–17.0)
Hemoglobin: 10.9 g/dL — ABNORMAL LOW (ref 13.0–17.0)
Hemoglobin: 10.9 g/dL — ABNORMAL LOW (ref 13.0–17.0)
Hemoglobin: 10.9 g/dL — ABNORMAL LOW (ref 13.0–17.0)
Hemoglobin: 10.9 g/dL — ABNORMAL LOW (ref 13.0–17.0)
O2 Saturation: 100 %
O2 Saturation: 100 %
O2 Saturation: 100 %
O2 Saturation: 100 %
O2 Saturation: 100 %
Potassium: 4.5 mmol/L (ref 3.5–5.1)
Potassium: 5.1 mmol/L (ref 3.5–5.1)
Potassium: 5.2 mmol/L — ABNORMAL HIGH (ref 3.5–5.1)
Potassium: 4.7 mmol/L (ref 3.5–5.1)
Potassium: 5.3 mmol/L — ABNORMAL HIGH (ref 3.5–5.1)
Sodium: 140 mmol/L (ref 135–145)
Sodium: 137 mmol/L (ref 135–145)
Sodium: 138 mmol/L (ref 135–145)
Sodium: 137 mmol/L (ref 135–145)
Sodium: 139 mmol/L (ref 135–145)
TCO2: 27 mmol/L (ref 22–32)
TCO2: 27 mmol/L (ref 22–32)
TCO2: 27 mmol/L (ref 22–32)
TCO2: 26 mmol/L (ref 22–32)
TCO2: 27 mmol/L (ref 22–32)
pCO2 arterial: 47.5 mmHg (ref 32–48)
pCO2 arterial: 42.1 mmHg (ref 32–48)
pCO2 arterial: 44.9 mmHg (ref 32–48)
pCO2 arterial: 39.4 mmHg (ref 32–48)
pCO2 arterial: 52.4 mmHg — ABNORMAL HIGH (ref 32–48)
pH, Arterial: 7.337 — ABNORMAL LOW (ref 7.35–7.45)
pH, Arterial: 7.37 (ref 7.35–7.45)
pH, Arterial: 7.391 (ref 7.35–7.45)
pH, Arterial: 7.289 — ABNORMAL LOW (ref 7.35–7.45)
pH, Arterial: 7.406 (ref 7.35–7.45)
pO2, Arterial: 379 mmHg — ABNORMAL HIGH (ref 83–108)
pO2, Arterial: 218 mmHg — ABNORMAL HIGH (ref 83–108)
pO2, Arterial: 230 mmHg — ABNORMAL HIGH (ref 83–108)
pO2, Arterial: 215 mmHg — ABNORMAL HIGH (ref 83–108)
pO2, Arterial: 268 mmHg — ABNORMAL HIGH (ref 83–108)

## 2021-04-05 LAB — CBC
HCT: 30.9 % — ABNORMAL LOW (ref 39.0–52.0)
HCT: 31.3 % — ABNORMAL LOW (ref 39.0–52.0)
Hemoglobin: 10.8 g/dL — ABNORMAL LOW (ref 13.0–17.0)
Hemoglobin: 10.7 g/dL — ABNORMAL LOW (ref 13.0–17.0)
MCH: 33.8 pg (ref 26.0–34.0)
MCH: 33.1 pg (ref 26.0–34.0)
MCHC: 35 g/dL (ref 30.0–36.0)
MCHC: 34.2 g/dL (ref 30.0–36.0)
MCV: 96.6 fL (ref 80.0–100.0)
MCV: 96.9 fL (ref 80.0–100.0)
Platelets: 93 10*3/uL — ABNORMAL LOW (ref 150–400)
Platelets: 92 10*3/uL — ABNORMAL LOW (ref 150–400)
RBC: 3.2 MIL/uL — ABNORMAL LOW (ref 4.22–5.81)
RBC: 3.23 MIL/uL — ABNORMAL LOW (ref 4.22–5.81)
RDW: 12.9 % (ref 11.5–15.5)
RDW: 13 % (ref 11.5–15.5)
WBC: 8.9 10*3/uL (ref 4.0–10.5)
WBC: 8.7 10*3/uL (ref 4.0–10.5)
nRBC: 0 % (ref 0.0–0.2)
nRBC: 0 % (ref 0.0–0.2)

## 2021-04-05 LAB — POCT I-STAT EG7
Acid-base deficit: 3 mmol/L — ABNORMAL HIGH (ref 0.0–2.0)
Bicarbonate: 22.9 mmol/L (ref 20.0–28.0)
Calcium, Ion: 1.07 mmol/L — ABNORMAL LOW (ref 1.15–1.40)
HCT: 31 % — ABNORMAL LOW (ref 39.0–52.0)
Hemoglobin: 10.5 g/dL — ABNORMAL LOW (ref 13.0–17.0)
O2 Saturation: 85 %
Potassium: 3.9 mmol/L (ref 3.5–5.1)
Sodium: 142 mmol/L (ref 135–145)
TCO2: 24 mmol/L (ref 22–32)
pCO2, Ven: 43.9 mmHg — ABNORMAL LOW (ref 44–60)
pH, Ven: 7.325 (ref 7.25–7.43)
pO2, Ven: 54 mmHg — ABNORMAL HIGH (ref 32–45)

## 2021-04-05 LAB — POCT I-STAT, CHEM 8
BUN: 12 mg/dL (ref 6–20)
BUN: 11 mg/dL (ref 6–20)
BUN: 11 mg/dL (ref 6–20)
Calcium, Ion: 1.26 mmol/L (ref 1.15–1.40)
Calcium, Ion: 1.12 mmol/L — ABNORMAL LOW (ref 1.15–1.40)
Calcium, Ion: 1.13 mmol/L — ABNORMAL LOW (ref 1.15–1.40)
Chloride: 108 mmol/L (ref 98–111)
Chloride: 104 mmol/L (ref 98–111)
Chloride: 103 mmol/L (ref 98–111)
Creatinine, Ser: 0.7 mg/dL (ref 0.61–1.24)
Creatinine, Ser: 0.7 mg/dL (ref 0.61–1.24)
Creatinine, Ser: 0.7 mg/dL (ref 0.61–1.24)
Glucose, Bld: 103 mg/dL — ABNORMAL HIGH (ref 70–99)
Glucose, Bld: 141 mg/dL — ABNORMAL HIGH (ref 70–99)
Glucose, Bld: 132 mg/dL — ABNORMAL HIGH (ref 70–99)
HCT: 39 % (ref 39.0–52.0)
HCT: 32 % — ABNORMAL LOW (ref 39.0–52.0)
HCT: 31 % — ABNORMAL LOW (ref 39.0–52.0)
Hemoglobin: 13.3 g/dL (ref 13.0–17.0)
Hemoglobin: 10.9 g/dL — ABNORMAL LOW (ref 13.0–17.0)
Hemoglobin: 10.5 g/dL — ABNORMAL LOW (ref 13.0–17.0)
Potassium: 4.5 mmol/L (ref 3.5–5.1)
Potassium: 5 mmol/L (ref 3.5–5.1)
Potassium: 4.7 mmol/L (ref 3.5–5.1)
Sodium: 137 mmol/L (ref 135–145)
Sodium: 136 mmol/L (ref 135–145)
Sodium: 138 mmol/L (ref 135–145)
TCO2: 26 mmol/L (ref 22–32)
TCO2: 25 mmol/L (ref 22–32)
TCO2: 26 mmol/L (ref 22–32)

## 2021-04-05 LAB — BASIC METABOLIC PANEL
Anion gap: 6 (ref 5–15)
Anion gap: 8 (ref 5–15)
BUN: 8 mg/dL (ref 6–20)
BUN: 8 mg/dL (ref 6–20)
CO2: 22 mmol/L (ref 22–32)
CO2: 23 mmol/L (ref 22–32)
Calcium: 8.1 mg/dL — ABNORMAL LOW (ref 8.9–10.3)
Calcium: 8.4 mg/dL — ABNORMAL LOW (ref 8.9–10.3)
Chloride: 106 mmol/L (ref 98–111)
Chloride: 103 mmol/L (ref 98–111)
Creatinine, Ser: 0.74 mg/dL (ref 0.61–1.24)
Creatinine, Ser: 0.92 mg/dL (ref 0.61–1.24)
GFR, Estimated: >60 mL/min (ref >60)
GFR, Estimated: >60 mL/min (ref >60)
Glucose, Bld: 113 mg/dL — ABNORMAL HIGH (ref 70–99)
Glucose, Bld: 111 mg/dL — ABNORMAL HIGH (ref 70–99)
Potassium: 4.2 mmol/L (ref 3.5–5.1)
Potassium: 3.8 mmol/L (ref 3.5–5.1)
Sodium: 134 mmol/L — ABNORMAL LOW (ref 135–145)
Sodium: 134 mmol/L — ABNORMAL LOW (ref 135–145)

## 2021-04-05 LAB — MAGNESIUM
Magnesium: 2.6 mg/dL — ABNORMAL HIGH (ref 1.7–2.4)
Magnesium: 2.6 mg/dL — ABNORMAL HIGH (ref 1.7–2.4)

## 2021-04-05 LAB — SURGICAL PATHOLOGY

## 2021-04-05 LAB — PROTIME-INR
INR: 1.5 — ABNORMAL HIGH (ref 0.8–1.2)
Prothrombin Time: 18.1 seconds — ABNORMAL HIGH (ref 11.4–15.2)

## 2021-04-05 MED ORDER — ALBUMIN HUMAN 5 % IV SOLN
12.5000 g | Freq: Once | INTRAVENOUS | Status: AC
  Administered 2021-04-05: 12.5 g via INTRAVENOUS
  Filled 2021-04-05: qty 250

## 2021-04-05 MED ORDER — ASPIRIN EC 81 MG PO TBEC
81.0000 mg | DELAYED_RELEASE_TABLET | Freq: Every day | ORAL | Status: DC
  Administered 2021-04-05 – 2021-04-11 (×7): 81 mg via ORAL
  Filled 2021-04-05 (×7): qty 1

## 2021-04-05 MED ORDER — ENOXAPARIN SODIUM 40 MG/0.4ML IJ SOSY
40.0000 mg | PREFILLED_SYRINGE | Freq: Every day | INTRAMUSCULAR | Status: DC
  Administered 2021-04-05 – 2021-04-06 (×2): 40 mg via SUBCUTANEOUS
  Filled 2021-04-05 (×2): qty 0.4

## 2021-04-05 MED ORDER — WARFARIN - PHYSICIAN DOSING INPATIENT: Freq: Every day | Status: DC

## 2021-04-05 MED ORDER — WARFARIN SODIUM 2.5 MG PO TABS
2.5000 mg | ORAL_TABLET | Freq: Every day | ORAL | Status: DC
  Administered 2021-04-05 – 2021-04-07 (×3): 2.5 mg via ORAL
  Filled 2021-04-05 (×3): qty 1

## 2021-04-05 MED FILL — Potassium Chloride Inj 2 mEq/ML: INTRAVENOUS | Qty: 40 | Status: AC

## 2021-04-05 MED FILL — Heparin Sodium (Porcine) Inj 1000 Unit/ML: Qty: 1000 | Status: AC

## 2021-04-05 MED FILL — Mannitol IV Soln 20%: INTRAVENOUS | Qty: 500 | Status: AC

## 2021-04-05 MED FILL — Heparin Sodium (Porcine) Inj 1000 Unit/ML: INTRAMUSCULAR | Qty: 10 | Status: AC

## 2021-04-05 MED FILL — Magnesium Sulfate Inj 50%: INTRAMUSCULAR | Qty: 10 | Status: AC

## 2021-04-05 MED FILL — Lidocaine HCl Local Preservative Free (PF) Inj 2%: INTRAMUSCULAR | Qty: 15 | Status: AC

## 2021-04-05 MED FILL — Electrolyte-R (PH 7.4) Solution: INTRAVENOUS | Qty: 4000 | Status: AC

## 2021-04-05 MED FILL — Sodium Chloride IV Soln 0.9%: INTRAVENOUS | Qty: 2000 | Status: AC

## 2021-04-05 MED FILL — Sodium Bicarbonate IV Soln 8.4%: INTRAVENOUS | Qty: 50 | Status: AC

## 2021-04-05 NOTE — Discharge Summary (Addendum)
Physician Discharge Summary       [address]       [place] [address]             [phone]    Patient ID: Isaac Diaz MRN: [id number] DOB/AGE: 1965-03-06 56 y.o.  Admit date: 04/04/2021 Discharge date: 04/11/2021  Admission Diagnoses: Bicuspid aortic valve 2. Severe aortic stenosis and insufficiency  Discharge Diagnoses:  S/p prosthetic AVR Post op a fib Thrombocytopenia post op;resolved 02/27 History of enlarged prostate History of tobacco abuse   Consults: None  Procedure (s):  Median sternotomy, extracorporeal circulation, aortic valve replacement with 23 mm On-X mechanical valve (reference ONXANE, serial number [id number]) by Dr. Roxan Hockey on 04/04/2021.  HPI: This is a 56 year old man with a history of a heart murmur and a bicuspid aortic valve.  He also has a history of an enlarged prostate.  He has been followed with serial echocardiograms.  In October 2022 had an echocardiogram which showed progression of his aortic stenosis to severe with a valve area calculated 0.9 cm.  He had mild to moderate AI and it was felt that his valve area may have been overestimated.  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  He has been having difficulty with fatigue and shortness of breath with exertion.  He also notes that if he gets up quickly to go to the bathroom he sometimes sees spots and feels a little dizzy.  He has not had any syncope.  He denies chest pain,pressure, or tightness.  No peripheral edema.  Dr. Roxan Hockey and the patient discussed the options of surgical AVR versus TAVR, and also the options of mechanical versus tissue valves.  They discussed the relative advantages and disadvantages of each. TAVR or tissue valve would avoid that, but he would be at high risk for early failure and need for repeat procedures. Patient wishes to have a mechanical valve replacement and he understands that requires lifelong anticoagulation.  Potential risks, benefits, and complications of the surgery were discussed with the patient and he agreed to proceed. Of note, he had two teeth removed and the remaining teeth checked about a month ago.  He is good to go from a dental standpoint.  Hospital Course:    Patient underwent a median sternotomy for aortic valve replacement using an On X mechanical valve. He was transported from the OR to [facility] ICU in stable condition. He was extubated early afternoon on 02/22. Post op EKG showed anterior ST elevation;no change from pre op EKG. Isaac Diaz and chest tubes were removed on post op day one. Once Neo Synephrine drip stopped, a line was removed. Ec asa was decreased to 81 mg daily and he was started on Coumadin for a mechanical aortic valve. PT and INR were monitored daily. He was put on Toprol Xl 25 mg daily. He continued to maintain SR. He was transitioned off the Insulin drip. His pre op HGA1C was 5.4. Accu checks and SS PRN will be stopped upon transfer. He had thrombocytopenia post op. He had no evidence of bleeding. Platelets went as low as 79K.  Trending higher and then resolved with most recent value on 04/09/2021 of 187,000. The patient has developed postoperative atrial fibrillation and was placed on a amiodarone drip.  He was also on Toprol XL 25 mg daily.This was transitioned to oral as he converted to mostly sinus rhythm on 02/27. INR remained 1.4 on 02/27. He was given 7.5 mg of Coumadin  the evening of 02/27 and 02/28. Epicardial pacing wires were removed on 02/28. He had sternal pain post op. He was given a Lidocaine patch, Oxy 5-10 mg Q 3 hours PRN, and Flexeril for muscle spasms 5 mg bid PRN. He has been ambulating on room air with good oxygenation. He has been tolerating a diet and has had a bowel movement. His wound is clean, dry, healing without signs of infection. He did have an infiltrated IV in his right forearm (IV Amiodarone infused) and it was swollen and painful. There was no erythema so  he was not put on an antibiotic. Initially, heat was used followed by ice. He had less swelling and somewhat less pain on 03/01. INR this am 03/01 is up to 2.2. As discussed with Dr. Roxan Hockey, will continue with Coumadin 5 mg. He has a PT/INR appointment for the am (no appointment available on Friday in [place]). As discussed with Dr. Roxan Hockey, patient is felt surgically stable for discharge today.     Latest Vital Signs: Blood pressure (!) 140/94, pulse 93, temperature 98.1 F (36.7 C), temperature source Oral, resp. rate 18, height 6\' 2"  (1.88 m), weight 75.1 kg, SpO2 99 %.  Physical Exam: Cardiovascular: RRR, sharp valve click Pulmonary: Slightly diminished bibasilar breath sounds R>L Abdomen: Soft, non tender, bowel sounds present. Extremities: No lower extremity edema. Right forearm less swollen, still somewhat painful. No erythema present Wounds: Clean and dry.  No erythema or signs of infection.    Discharge Condition:Stable and discharged to home.  Recent laboratory studies:  Lab Results  Component Value Date   WBC 6.9 04/11/2021   HGB 12.8 (L) 04/11/2021   HCT 37.4 (L) 04/11/2021   MCV 93.7 04/11/2021   PLT 351 04/11/2021   Lab Results  Component Value Date   NA 133 (L) 04/11/2021   K 3.9 04/11/2021   CL 95 (L) 04/11/2021   CO2 25 04/11/2021   CREATININE 1.04 04/11/2021   GLUCOSE 112 (H) 04/11/2021      Diagnostic Studies: DG Chest 2 View  Result Date: 04/07/2021 CLINICAL DATA:  Follow-up, status post aortic valve replacement. EXAM: CHEST - 2 VIEW COMPARISON:  04/06/2021 and older exams. FINDINGS: All lines and tubes have been removed. Cardiac silhouette normal in size.  No mediastinal widening. Opacities at the lung bases have mildly improved. Interstitial thickening is similar to the previous day's study. No new lung abnormalities. No pneumothorax. IMPRESSION: 1. Mild interval improvement with a decrease in lung base opacities consistent with decreased  pleural effusions and associated lung base atelectasis or edema. 2. Persistent bilateral station thickening suggests mild residual interstitial pulmonary edema. Electronically Signed   By: Lajean Manes M.D.   On: 04/07/2021 08:01   DG Chest 2 View  Result Date: 04/04/2021 CLINICAL DATA:  Preoperative evaluation prior to heart valve replacement surgery. EXAM: CHEST - 2 VIEW COMPARISON:  February 23, 2020 FINDINGS: The heart size and mediastinal contours are within normal limits. There is mild calcification of the aortic arch. Both lungs are clear. The visualized skeletal structures are unremarkable. IMPRESSION: No active cardiopulmonary disease. Electronically Signed   By: Virgina Norfolk M.D.   On: 04/04/2021 02:48   CARDIAC CATHETERIZATION  Result Date: 03/16/2021 No angiographic evidence of CAD Normal right heart pressures Severe aortic stenosis by echo. I did not cross the aortic valve today. Recommendations: Continue planning for AVR. He is likely going to be best treated with surgical AVR. I will make a referral to CT surgery to discuss  this.   DG CHEST PORT 1 VIEW  Result Date: 04/08/2021 CLINICAL DATA:  Dyspnea EXAM: PORTABLE CHEST 1 VIEW COMPARISON:  04/07/2021 FINDINGS: The lungs are symmetrically well expanded. Interval development of small bilateral pleural effusions. Mild interstitial pulmonary edema persists, possibly representing mild cardiogenic failure. No pneumothorax. Median sternotomy has been performed. Cardiac size is within normal limits. No acute bone abnormality. IMPRESSION: Stable perihilar interstitial pulmonary edema possibly representing mild cardiogenic failure. Enlarging bilateral pleural effusions. Electronically Signed   By: Fidela Salisbury M.D.   On: 04/08/2021 02:14   DG Chest Port 1 View  Result Date: 04/06/2021 CLINICAL DATA:  Sore chest today post aortic valve replacement EXAM: PORTABLE CHEST 1 VIEW COMPARISON:  Radiograph 04/05/2021 FINDINGS: Prior median  sternotomy and aortic valve replacement. Pulmonary artery catheter has been removed with retained vascular sheath. There are diffuse interstitial opacities and mid to lower lung airspace opacities bilaterally, new from prior exam. Thickening of the minor fissure. Probable trace effusions. No pneumothorax. No acute osseous abnormality. IMPRESSION: Mild to moderate pulmonary edema.  Probable trace effusions. Electronically Signed   By: Maurine Simmering M.D.   On: 04/06/2021 08:14   DG Chest Port 1 View  Result Date: 04/05/2021 CLINICAL DATA:  Post open heart surgery, chest tubes EXAM: PORTABLE CHEST 1 VIEW COMPARISON:  Portable exam 0555 hours compared to 04/04/2021 FINDINGS: Interval removal of endotracheal and nasogastric tubes. RIGHT jugular Swan-Ganz catheter with tip projecting over proximal RIGHT pulmonary artery. Mediastinal drain and epicardial pacing wires present. Normal heart size, mediastinal contours, and pulmonary vascularity. Atherosclerotic calcification aorta. Bibasilar atelectasis. No infiltrate, pleural effusion, or pneumothorax. IMPRESSION: Mild bibasilar atelectasis. Aortic Atherosclerosis (ICD10-I70.0). Electronically Signed   By: Lavonia Dana M.D.   On: 04/05/2021 08:28   DG Chest Port 1 View  Result Date: 04/04/2021 CLINICAL DATA:  Postop from aortic valve replacement. EXAM: PORTABLE CHEST 1 VIEW COMPARISON:  04/03/2021 FINDINGS: Patient has undergone median sternotomy and aortic valve replacement since prior study. Swan-Ganz catheter tip overlies the proximal right pulmonary artery. Endotracheal tube and nasogastric tube are seen in appropriate position. Mediastinal drain is seen in the right paraspinal region. Heart size is normal. Aortic atherosclerotic calcification noted. No evidence of pneumothorax. Both lungs are clear. IMPRESSION: Status post aortic valve replacement. No evidence of pneumothorax or active lung disease. Electronically Signed   By: Marlaine Hind M.D.   On: 04/04/2021  12:42   ECHO INTRAOPERATIVE TEE  Result Date: 04/04/2021  *INTRAOPERATIVE TRANSESOPHAGEAL REPORT *  Patient Name:   DAELIN MAIRE Date of Exam: 04/04/2021 Medical Rec #:  [id number]         Height:       74.0 in Accession #:    [id number]        Weight:       181.0 lb Date of Birth:  04-Sep-1965         BSA:          2.08 m Patient Age:    30 years          BP:           95/69 mmHg Patient Gender: M                 HR:           78 bpm. Exam Location:  Inpatient Transesophogeal exam was perform intraoperatively during surgical procedure. Patient was closely monitored under general anesthesia during the entirety of examination. Indications:     I35.2 Nonrheumatic aortic (  valve) stenosis with insufficiency Performing Phys: Adele Barthel MD Diagnosing Phys: Adele Barthel MD Complications: No known complications during this procedure. POST-OP IMPRESSIONS _ Left Ventricle: The left ventricle is unchanged from pre-bypass. _ Right Ventricle: The right ventricle appears unchanged from pre-bypass. _ Aorta: The aorta appears unchanged from pre-bypass. _ Left Atrial Appendage: The left atrial appendage appears unchanged from pre-bypass. _ Aortic Valve: No stenosis present. There is no regurgitation. The gradient recorded across the prosthetic valve is within the expected range. No perivalvular leak noted. _ Mitral Valve: The mitral valve appears unchanged from pre-bypass. _ Tricuspid Valve: The tricuspid valve appears unchanged from pre-bypass. _ Pulmonic Valve: The pulmonic valve appears unchanged from pre-bypass. _ Interatrial Septum: The interatrial septum appears unchanged from pre-bypass. _ Pericardium: The pericardium appears unchanged from pre-bypass. PRE-OP FINDINGS  Left Ventricle: The left ventricle has normal systolic function, with an ejection fraction of 55-60%. The cavity size was normal. There is mildly increased left ventricular wall thickness. There is mild asymmetric left ventricular hypertrophy of the  anterior and lateral segments. Right Ventricle: The right ventricle has normal systolic function. The cavity was normal. There is no increase in right ventricular wall thickness. Left Atrium: Left atrial size was normal in size. No left atrial/left atrial appendage thrombus was detected. Right Atrium: Right atrial size was normal in size. Interatrial Septum: No atrial level shunt detected by color flow Doppler. Pericardium: There is no evidence of pericardial effusion. Mitral Valve: The mitral valve is normal in structure. Mitral valve regurgitation is trivial by color flow Doppler. Tricuspid Valve: The tricuspid valve was normal in structure. Tricuspid valve regurgitation is trivial by color flow Doppler. Aortic Valve: The aortic valve is thick, calcified, and bicuspid. The right coronary cusp and non-coronary cusp apear fused. There is a large calcification on the left coronary cusp. Aortic valve regurgitation is moderate by color flow Doppler. There is moderate stenosis of the aortic valve. Pulmonic Valve: The pulmonic valve was normal in structure. Pulmonic valve regurgitation is trivial by color flow Doppler. Aorta: The aortic arch are normal in size and structure. There is mild dilatation of the aortic root and of the ascending aorta.  Adele Barthel MD Electronically signed by Adele Barthel MD Signature Date/Time: 04/04/2021/12:11:40 PM    Final    VAS US DOPPLER PRE CABG  Result Date: 04/03/2021 PREOPERATIVE VASCULAR EVALUATION Patient Name:  UNIQUE DARTY  Date of Exam:   04/03/2021 Medical Rec #: [id number]          Accession #:    [id number] Date of Birth: 1965/11/09          Patient Gender: M Patient Age:   27 years Exam Location[facility]  [facility] Procedure:      VAS US DOPPLER PRE CABG Referring Phys: Remo Lipps HENDRICKSON --------------------------------------------------------------------------------  Indications:      Pre-CABG and Aortic atherosclerosis. Risk Factors:     Current smoker.  Comparison Study: No prior study Performing Technologist: Maudry Mayhew MHA, RVT, RDCS, RDMS  Examination Guidelines: A complete evaluation includes B-mode imaging, spectral Doppler, color Doppler, and power Doppler as needed of all accessible portions of each vessel. Bilateral testing is considered an integral part of a complete examination. Limited examinations for reoccurring indications may be performed as noted.  Right Carotid Findings: +----------+-------+--------+--------+-----------------------+-----------------+             PSV     EDV cm/s Stenosis Describe  Comments                       cm/s                                                                 +----------+-------+--------+--------+-----------------------+-----------------+  CCA Prox   101     14                                        intimal                                                                          thickening         +----------+-------+--------+--------+-----------------------+-----------------+  CCA Distal 93      19                                                           +----------+-------+--------+--------+-----------------------+-----------------+  ICA Prox   146     37                heterogenous and        Shadowing                                                calcific                                   +----------+-------+--------+--------+-----------------------+-----------------+  ICA Distal 105     26                                                           +----------+-------+--------+--------+-----------------------+-----------------+  ECA        90      13                                                           +----------+-------+--------+--------+-----------------------+-----------------+ +----------+--------+-------+----------------+------------+             PSV cm/s EDV cms Describe         Arm Pressure  +----------+--------+-------+----------------+------------+  Subclavian 139  Multiphasic, WNL               +----------+--------+-------+----------------+------------+ +---------+--------+--+--------+--+---------+  Vertebral PSV cm/s 62 EDV cm/s 12 Antegrade  +---------+--------+--+--------+--+---------+ Left Carotid Findings: +----------+--------+--------+--------+------------+---------+             PSV cm/s EDV cm/s Stenosis Describe     Comments   +----------+--------+--------+--------+------------+---------+  CCA Prox   82       16                                        +----------+--------+--------+--------+------------+---------+  CCA Distal 116      26                                        +----------+--------+--------+--------+------------+---------+  ICA Prox   130      24                heterogenous Shadowing  +----------+--------+--------+--------+------------+---------+  ICA Distal 82       28                                        +----------+--------+--------+--------+------------+---------+  ECA        58       6                                         +----------+--------+--------+--------+------------+---------+ +----------+--------+--------+----------------+------------+  Subclavian PSV cm/s EDV cm/s Describe         Arm Pressure  +----------+--------+--------+----------------+------------+             114               Multiphasic, WNL               +----------+--------+--------+----------------+------------+ +---------+--------+--+--------+-+---------+  Vertebral PSV cm/s 37 EDV cm/s 9 Antegrade  +---------+--------+--+--------+-+---------+  ABI Findings: +--------+------------------+-----+---------+--------+  Right    Rt Pressure (mmHg) Index Waveform  Comment   +--------+------------------+-----+---------+--------+  Brachial 134                      triphasic           +--------+------------------+-----+---------+--------+ +--------+------------------+-----+---------+-------+  Left     Lt Pressure (mmHg) Index Waveform  Comment   +--------+------------------+-----+---------+-------+  Brachial 117                      triphasic          +--------+------------------+-----+---------+-------+  Right Doppler Findings: +-----------+--------+-----+---------+-----------------------------------------+  Site        Pressure Index Doppler   Comments                                   +-----------+--------+-----+---------+-----------------------------------------+  Brachial    134            triphasic                                            +-----------+--------+-----+---------+-----------------------------------------+  Radial                     triphasic                                            +-----------+--------+-----+---------+-----------------------------------------+  Ulnar                      triphasic                                            +-----------+--------+-----+---------+-----------------------------------------+  Palmar Arch                          Signal is unaffected with radial                                                 compression, decreases >50% with ulnar                                           compression.                               +-----------+--------+-----+---------+-----------------------------------------+  Left Doppler Findings: +-----------+--------+-----+---------+-----------------------------------------+  Site        Pressure Index Doppler   Comments                                   +-----------+--------+-----+---------+-----------------------------------------+  Brachial    117            triphasic                                            +-----------+--------+-----+---------+-----------------------------------------+  Radial                     triphasic                                            +-----------+--------+-----+---------+-----------------------------------------+  Ulnar                      triphasic                                             +-----------+--------+-----+---------+-----------------------------------------+  Palmar Arch                          Signal decreases >50% with radial  compression, unaffected with ulnar                                               compression.                               +-----------+--------+-----+---------+-----------------------------------------+  Summary: Right Carotid: Velocities in the right ICA are consistent with a 1-39% stenosis. Left Carotid: Velocities in the left ICA are consistent with a 1-39% stenosis. Vertebrals:  Bilateral vertebral arteries demonstrate antegrade flow. Subclavians: Normal flow hemodynamics were seen in bilateral subclavian              arteries. Right Upper Extremity: Doppler waveforms remain within normal limits with right radial compression. Doppler waveforms decrease >50% with right ulnar compression. Left Upper Extremity: Doppler waveforms decrease >50% with left radial compression. Doppler waveforms remain within normal limits with left ulnar compression.  Electronically signed by Servando Snare MD on 04/03/2021 at 3:20:15 PM.    Final       Discharge Medications: Allergies as of 04/11/2021       Reactions   Other Anaphylaxis   Feathers (pillow stuffing)   Chocolate Hives        Medication List     STOP taking these medications    amLODipine 5 MG tablet Commonly known as: NORVASC   Bactroban Nasal 2 % Generic drug: mupirocin nasal ointment       TAKE these medications    amiodarone 200 MG tablet Commonly known as: PACERONE Take 2 tablets (400 mg total) by mouth 2 (two) times daily. For 2 days;then take 200 mg bid for 2 weeks;then take 200 mg daily thereafter   aspirin 81 MG EC tablet Take 1 tablet (81 mg total) by mouth daily. Swallow whole.   furosemide 40 MG tablet Commonly known as: LASIX Take 1 tablet (40 mg total) by mouth daily. For 4 days then stop.   guaiFENesin 600 MG 12 hr  tablet Commonly known as: MUCINEX Take 1 tablet (600 mg total) by mouth 2 (two) times daily as needed for cough or to loosen phlegm.   lidocaine 5 % Commonly known as: LIDODERM Place 1 patch onto the skin daily. Remove & Discard patch within 12 hours or as directed by MD   metoprolol succinate 25 MG 24 hr tablet Commonly known as: TOPROL-XL Take 1 tablet (25 mg total) by mouth daily.   Oxycodone HCl 10 MG Tabs Take 1 tablet (10 mg total) by mouth every 6 (six) hours as needed for severe pain.   potassium chloride SA 20 MEQ tablet Commonly known as: KLOR-CON M Take 1 tablet (20 mEq total) by mouth daily. For 4 days then stop.   warfarin 5 MG tablet Commonly known as: Coumadin Take 1 tablet (5 mg total) by mouth daily. Or as directed       The patient has been discharged on:   1.Beta Blocker:  Yes [  x ]                              No   [   ]  If No, reason:  2.Ace Inhibitor/ARB: Yes [   ]                                     No  [  x  ]                                     If No, reason:Labile BP. Will try to start if BP allows after discharge  3.Statin:   Yes [   ]                  No  [ x  ]                  If No, reason:No CAD  4.Shela Commons:  Yes  [  x ]                  No   [   ]                  If No, reason:  Patient had ACS upon admission:No  Plavix/P2Y12 inhibitor: Yes [   ]                                      No  [x   ]   Follow Up Appointments:  Follow-up Information     Melrose Nakayama, MD. Go on 05/08/2021.   Specialty: Cardiothoracic Surgery Why: PA/LAT CXR to be taken (at [facility] which is in the same building as Dr. Leonarda Salon office) on 03/28 at 12:15 pm;Appointment time is at 12:45 pm Contact information: [address] [place] [address] [phone]         [facility]. Go on 04/12/2021.   Specialty: Cardiology Why: Appointment is for PT and INR  to be drawn (on Coumadin for On X mechanical AVR);Appointment time is at 10:00 am Contact information: [place] [phone]        [facility], [place]. Go on 05/01/2021.   Specialties: Physician Assistant, Cardiology Why: Appointment time is at 3:00 pm Contact information: [place] [address] [phone]         [facility]. Go on 05/18/2021.   Why: Appointment is to have echo taken. Appointment time is at 9:30 am. Contact information: [address] [phone]                Signed: Sharalyn Ink [facility] 04/11/2021, 9:04 AM

## 2021-04-05 NOTE — Progress Notes (Signed)
1 Day Post-Op Procedure(s) (LRB): AORTIC VALVE REPLACEMENT (AVR) USING ON-X VALVE SIZE 23MM (N/A) TRANSESOPHAGEAL ECHOCARDIOGRAM (TEE) (N/A) Subjective: No complaints, denies pain and nausea  Objective: Vital signs in last 24 hours: Temp:  [94.5 F (34.7 C)-99.9 F (37.7 C)] 99.9 F (37.7 C) (02/23 0600) Pulse Rate:  [59-72] 69 (02/23 0600) Cardiac Rhythm: Normal sinus rhythm (02/22 2000) Resp:  [12-29] 17 (02/23 0600) BP: (100-130)/(54-92) 114/72 (02/23 0600) SpO2:  [99 %-100 %] 99 % (02/22 1600) Arterial Line BP: (78-148)/(49-83) 110/57 (02/23 0600) FiO2 (%):  [40 %-50 %] 40 % (02/22 1455) Weight:  [84.9 kg] 84.9 kg (02/23 0545)  Hemodynamic parameters for last 24 hours: PAP: (6-35)/(2-20) 31/18 CO:  [3.4 L/min-6 L/min] 3.8 L/min CI:  [1.6 L/min/m2-2.9 L/min/m2] 1.8 L/min/m2  Intake/Output from previous day: 02/22 0701 - 02/23 0700 In: 4388.9 [I.V.:2359; Blood:435; IV Piggyback:1594.8] Out: [id number] [Urine:3127; Blood:669; Chest Tube:325] Intake/Output this shift: No intake/output data recorded.  General appearance: alert, cooperative, and no distress Neurologic: intact Heart: regular rate and rhythm Lungs: diminished breath sounds bibasilar Abdomen: hypoactive BS, not distended  Lab Results: Recent Labs    04/04/21 1742 04/05/21 0316  WBC 10.4 8.9  HGB 11.4* 10.8*  HCT 33.9* 30.9*  PLT 98* 93*   BMET:  Recent Labs    04/04/21 1742 04/05/21 0316  NA 136 134*  K 4.6 4.2  CL 108 106  CO2 21* 22  GLUCOSE 105* 113*  BUN 10 8  CREATININE 0.81 0.74  CALCIUM 8.0* 8.1*    PT/INR:  Recent Labs    04/05/21 0316  LABPROT 18.1*  INR 1.5*   ABG    Component Value Date/Time   PHART 7.358 04/04/2021 1614   HCO3 22.0 04/04/2021 1614   TCO2 23 04/04/2021 1614   ACIDBASEDEF 3.0 (H) 04/04/2021 1614   O2SAT 90 04/04/2021 1614   CBG (last 3)  Recent Labs    04/04/21 2016 04/04/21 2342 04/05/21 0412  GLUCAP 110* 112* 105*    Assessment/Plan: S/P  Procedure(s) (LRB): AORTIC VALVE REPLACEMENT (AVR) USING ON-X VALVE SIZE 23MM (N/A) TRANSESOPHAGEAL ECHOCARDIOGRAM (TEE) (N/A) POD # 1 Looks great NEURO- intact CV- in SR, still on neo with low filling pressures- albumin  ECG with ST elevation anterior- no change from preop  Start warfarin RESP_ IS RENAL- creatinine and lytes Ok ENDO- CBG well controlled- [facility]SI GI- advance diet as tolerated Anemia secondary to ABL- mild, follow Thrombocytopenia post bypass- no bleeding, monitor     LOS: 1 day    Melrose Nakayama 04/05/2021

## 2021-04-05 NOTE — Progress Notes (Signed)
Patient ID: Isaac Diaz, male   DOB: [birth year], 56 y.o.   MRN: [id number]  TCTS Evening Rounds:  Hemodynamically stable in sinus rhythm.   Urine output ok  Chest tubes removed.  BMET    Component Value Date/Time   NA 134 (L) 04/05/2021 1544   K 3.8 04/05/2021 1544   CL 103 04/05/2021 1544   CO2 23 04/05/2021 1544   GLUCOSE 111 (H) 04/05/2021 1544   BUN 8 04/05/2021 1544   CREATININE 0.92 04/05/2021 1544   CREATININE 1.11 06/21/2016 1046   CALCIUM 8.4 (L) 04/05/2021 1544   GFRNONAA >60 04/05/2021 1544   GFRNONAA 76 06/21/2016 1046   CBC    Component Value Date/Time   WBC 8.7 04/05/2021 1544   RBC 3.23 (L) 04/05/2021 1544   HGB 10.7 (L) 04/05/2021 1544   HCT 31.3 (L) 04/05/2021 1544   PLT 92 (L) 04/05/2021 1544   MCV 96.9 04/05/2021 1544   MCH 33.1 04/05/2021 1544   MCHC 34.2 04/05/2021 1544   RDW 13.0 04/05/2021 1544

## 2021-04-05 NOTE — Op Note (Signed)
Isaac Diaz, Isaac Diaz MEDICAL RECORD NO: [id number] ACCOUNT NO: [id number] DATE OF BIRTH: January 01, 1966 FACILITY: [facility] LOCATION: [facility] PHYSICIAN: Salvatore Decent. 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  PROCEDURE PERFORMED:   Median sternotomy, extracorporeal circulation,  Aortic valve replacement with 23 mm On-X mechanical valve (reference ONXANE, serial number [id number]).  SURGEON:  Charlett Lango, MD  ASSISTANT:  Doree Fudge, PA-C.  ANESTHESIA:  General.  FINDINGS: Transesophageal echocardiography showed a bicuspid aortic valve with aortic stenosis and aortic insufficiency.  INTRAOPERATIVE FINDINGS:  Relatively normal left/noncoronary commissure, right/noncoronary and right/left commissures fused.  Heavily calcified leaflets. Severe annular calcification along the right annulus adjacent to the right/left commissure.  CLINICAL NOTE: Isaac Diaz is a 56 year old gentleman with a known bicuspid aortic valve, who has had progression of aortic stenosis and aortic insufficiency on echocardiogram.  He has now become symptomatic with fatigue, shortness of breath and  presyncope.  He was offered surgical aortic valve replacement.  The indications, risks, benefits, and alternatives were discussed in detail with the patient. The option of tissue versus mechanical valve replacement was discussed with the patient.  He was  in favor of proceeding with mechanical valve replacement.  He understood the indications, risks, benefits, and alternatives.  DESCRIPTION OF PROCEDURE: Isaac Diaz was brought to the preoperative holding area on 04/04/2021.  Anesthesia placed a Swan-Ganz catheter and an arterial blood pressure monitoring line.  He was taken to the operating room,  anesthetized and intubated.  A Foley catheter was placed.  Intravenous antibiotics were administered.  Transesophageal echocardiography was performed by Dr. Karna Christmas. Please see his separate note for full details and findings of the procedure.  The chest, abdomen and legs were prepped and draped in the usual sterile fashion.  A timeout was performed.  Median sternotomy was performed.  After achieving initial hemostasis, the patient was heparinized.  The pericardium was opened.  The ascending aorta was inspected.  It was relatively large, about 3.9 cm, but not aneurysmal.   After confirming adequate anticoagulation with ACT measurement, the aorta was cannulated via concentric 2-0 Ethibond pledgeted pursestring sutures.  A dual stage venous cannula was placed via a pursestring suture in right atrial appendage.  Cardiopulmonary bypass was initiated.  Flows were maintained per protocol.  The patient was cooled to 32 degrees Celsius.  A left ventricular vent was placed via a pursestring suture in the right superior pulmonary vein.  A retrograde cardioplegia cannula was placed via a pursestring suture in the right atrium and directed into the coronary sinus and an antegrade cardioplegia cannula was placed in the ascending aorta.  Carbon dioxide was insufflated to the operative field.  The aorta was crossclamped.  Cardiac arrest was achieved with a combination of cold antegrade and retrograde KBC cardioplegia.  The calculated dose of 1320 mL was administered.  The initial 500 mL was given antegrade.  There was a diastolic arrest.  The remainder of the calculated dose was given retrograde.  There was septal cooling to 11 degrees Celsius.   An aortotomy was performed and was extended into the noncoronary sinus.  The valve was inspected.  The left/noncoronary commissure was relatively normal in appearance.  The other two commissures were fused.  The valve was heavily calcified, particularly  on the left side, and  the junction of the left and right.  The valve was excised.  Care was taken to contain all calcific debris.  The annulus was inspected.  There was heavy calcification along the right annulus near the junction of the left and right  commissure.  All calcium was carefully debrided.  The coronary ostia were inspected.  There was no abnormality of either. The annulus was copiously irrigated with iced saline.  The annulus sized for a 23 mm On-X mechanical valve.  2-0 Ethibond horizontal mattress sutures with subannular pledgets were placed circumferentially around the annulus.  A total of 17 sutures were utilized.  Sutures then were placed through the sewing ring of the prosthetic valve, which was then lowered  into place and the sutures were sequentially tied.  The leaflets were opened periodically to ensure good seating of the valve.  After the valve had been seated, both leaflets moved freely.  The annulus was probed with a fine-tipped right angle and no  gaps were noted.  The coronary ostia were inspected and they were not impinged upon.  Systemic rewarming was begun.  The aortotomy was closed in two layers with a running 4-0 Prolene suture.  The first layer was a running horizontal mattress suture followed by running simple suture.  Initially de-airing was performed after completing the first layer.  The crossclamp time was approaching 60 minutes, because we were so close to completing the crossclamp portion of the procedure, no additional cold cardioplegia was administered. After completing the first layer and de-airing the heart and aorta, the second layer of running suture was placed. A warm dose of retrograde  cardioplegia then was administered via the retrograde cannula, 400 mL was given.  The patient remained in Trendelenburg position.  Additional de-airing maneuvers were performed via the aortic root vent and the aortic crossclamp was removed.  The total  crossclamp time was 76 minutes.  The patient  spontaneously resumed sinus rhythm.  He did not require defibrillation.  While rewarming was completed, the aortotomy was inspected for hemostasis.  The left ventricular vent and retrograde cardioplegia cannula were removed.  Additional de-airing maneuvers were performed with removal of the LAD that.  The ascending aortic cardioplegia cannula was left in place for additional de-airing. Epicardial pacing wires were placed on the right ventricle and right atrium.  When the patient had rewarmed to a core temperature of 37 degrees Celsius. He was weaned from cardiopulmonary bypass on the first attempt.  He did not require inotropic support.  The total bypass time was 104 minutes. After ensuring there was no residual air, the aortic root vent was removed and the suture was tied.  A test dose of protamine was administered and was well tolerated.  The atrial and aortic cannulae were removed.  The remainder of the protamine was administered without incident.  The chest was irrigated with warm saline.  Hemostasis was achieved.  The  pericardium was reapproximated over the aorta and base of the heart with interrupted 3-0 silk sutures.  A single mediastinal chest tube was placed through a separate incision and secured with #1 silk suture.  The chest was closed with a combination of  single and double heavy gauge stainless steel wires to reapproximate the sternum.  The pectoralis fascia, subcutaneous tissue and skin were closed in standard fashion.  All sponge, needle and instrument counts were correct at the end of the procedure.  The patient remained hemodynamically stable throughout the post-bypass period.  Experienced assistance was necessary for this case due to surgical complexity. Doree Fudge  performed that role.  She provided exposure, retraction of delicate tissues, suture management and suctioning.  The patient then was transported from the operating room to the surgical intensive care unit,  intubated and in good condition.   SHW D: 04/04/2021 5:25:36 pm T: 04/05/2021 6:53:00 am  JOB: [id number]/ [id number]

## 2021-04-05 NOTE — Progress Notes (Signed)
EKG CRITICAL VALUE     12 lead EKG performed.  Critical value noted.  Emily Filbert, RN notified.   Alto Denver, CCT 04/05/2021 8:19 AM

## 2021-04-06 ENCOUNTER — Inpatient Hospital Stay (HOSPITAL_COMMUNITY): Payer: Managed Care, Other (non HMO)

## 2021-04-06 ENCOUNTER — Other Ambulatory Visit: Payer: Self-pay | Admitting: Physician Assistant

## 2021-04-06 DIAGNOSIS — I350 Nonrheumatic aortic (valve) stenosis: Secondary | ICD-10-CM

## 2021-04-06 LAB — PROTIME-INR
INR: 1.5 — ABNORMAL HIGH (ref 0.8–1.2)
Prothrombin Time: 18 seconds — ABNORMAL HIGH (ref 11.4–15.2)

## 2021-04-06 LAB — GLUCOSE, CAPILLARY
Glucose-Capillary: 106 mg/dL — ABNORMAL HIGH (ref 70–99)
Glucose-Capillary: 123 mg/dL — ABNORMAL HIGH (ref 70–99)
Glucose-Capillary: 103 mg/dL — ABNORMAL HIGH (ref 70–99)
Glucose-Capillary: 91 mg/dL (ref 70–99)

## 2021-04-06 LAB — BASIC METABOLIC PANEL
Anion gap: 5 (ref 5–15)
BUN: 10 mg/dL (ref 6–20)
CO2: 23 mmol/L (ref 22–32)
Calcium: 8.5 mg/dL — ABNORMAL LOW (ref 8.9–10.3)
Chloride: 106 mmol/L (ref 98–111)
Creatinine, Ser: 0.77 mg/dL (ref 0.61–1.24)
GFR, Estimated: >60 mL/min (ref >60)
Glucose, Bld: 104 mg/dL — ABNORMAL HIGH (ref 70–99)
Potassium: 3.9 mmol/L (ref 3.5–5.1)
Sodium: 134 mmol/L — ABNORMAL LOW (ref 135–145)

## 2021-04-06 LAB — CBC
HCT: 28.7 % — ABNORMAL LOW (ref 39.0–52.0)
Hemoglobin: 9.8 g/dL — ABNORMAL LOW (ref 13.0–17.0)
MCH: 33.2 pg (ref 26.0–34.0)
MCHC: 34.1 g/dL (ref 30.0–36.0)
MCV: 97.3 fL (ref 80.0–100.0)
Platelets: 82 10*3/uL — ABNORMAL LOW (ref 150–400)
RBC: 2.95 MIL/uL — ABNORMAL LOW (ref 4.22–5.81)
RDW: 12.9 % (ref 11.5–15.5)
WBC: 7.5 10*3/uL (ref 4.0–10.5)
nRBC: 0 % (ref 0.0–0.2)

## 2021-04-06 MED ORDER — POTASSIUM CHLORIDE 20 MEQ PO PACK
40.0000 meq | PACK | Freq: Once | ORAL | Status: AC
  Administered 2021-04-06: 40 meq via ORAL
  Filled 2021-04-06: qty 2

## 2021-04-06 MED ORDER — ALUM & MAG HYDROXIDE-SIMETH 200-200-20 MG/5ML PO SUSP: 15.0000 mL | Freq: Four times a day (QID) | ORAL | Status: DC | PRN

## 2021-04-06 MED ORDER — CHLORHEXIDINE GLUCONATE CLOTH 2 % EX PADS
6.0000 | MEDICATED_PAD | Freq: Every day | CUTANEOUS | Status: DC
  Administered 2021-04-06 – 2021-04-11 (×6): 6 via TOPICAL

## 2021-04-06 MED ORDER — POTASSIUM CHLORIDE CRYS ER 20 MEQ PO TBCR: 30.0000 meq | EXTENDED_RELEASE_TABLET | Freq: Once | ORAL | Status: DC

## 2021-04-06 MED ORDER — SODIUM CHLORIDE 0.9% FLUSH
3.0000 mL | Freq: Two times a day (BID) | INTRAVENOUS | Status: DC
  Administered 2021-04-06 – 2021-04-11 (×9): 3 mL via INTRAVENOUS

## 2021-04-06 MED ORDER — SODIUM CHLORIDE 0.9% FLUSH: 3.0000 mL | INTRAVENOUS | Status: DC | PRN

## 2021-04-06 MED ORDER — MAGNESIUM HYDROXIDE 400 MG/5ML PO SUSP
30.0000 mL | Freq: Every day | ORAL | Status: DC | PRN
  Administered 2021-04-07: 30 mL via ORAL
  Filled 2021-04-06: qty 30

## 2021-04-06 MED ORDER — SODIUM CHLORIDE 0.9 % IV SOLN: 250.0000 mL | INTRAVENOUS | Status: DC | PRN

## 2021-04-06 MED ORDER — METOPROLOL SUCCINATE ER 25 MG PO TB24
25.0000 mg | ORAL_TABLET | Freq: Every day | ORAL | Status: DC
  Administered 2021-04-06 – 2021-04-11 (×6): 25 mg via ORAL
  Filled 2021-04-06 (×6): qty 1

## 2021-04-06 MED ORDER — ~~LOC~~ CARDIAC SURGERY, PATIENT & FAMILY EDUCATION: Freq: Once | Status: DC

## 2021-04-06 MED ORDER — FUROSEMIDE 40 MG PO TABS
40.0000 mg | ORAL_TABLET | Freq: Once | ORAL | Status: AC
  Administered 2021-04-06: 40 mg via ORAL
  Filled 2021-04-06: qty 1

## 2021-04-06 MED ORDER — POTASSIUM CHLORIDE CRYS ER 20 MEQ PO TBCR
20.0000 meq | EXTENDED_RELEASE_TABLET | Freq: Once | ORAL | Status: AC
  Administered 2021-04-06: 20 meq via ORAL
  Filled 2021-04-06: qty 1

## 2021-04-06 NOTE — Progress Notes (Signed)
2 Days Post-Op Procedure(s) (LRB): AORTIC VALVE REPLACEMENT (AVR) USING ON-X VALVE SIZE (N/A) TRANSESOPHAGEAL ECHOCARDIOGRAM (TEE) (N/A) Subjective: Some pain right side of sternum and back, no nausea  Objective: Vital signs in last 24 hours: Temp:  [97.7 F (36.5 C)-98.8 F (37.1 C)] 97.7 F (36.5 C) (02/24 0418) Pulse Rate:  [64-81] 81 (02/24 0700) Cardiac Rhythm: Normal sinus rhythm (02/24 0000) Resp:  [14-35] 15 (02/24 0700) BP: (99-144)/(68-89) 124/83 (02/24 0700) Arterial Line BP: (95-109)/(52-56) 104/52 (02/23 1000) Weight:  [84.5 kg] 84.5 kg (02/24 0500)  Hemodynamic parameters for last 24 hours: PAP: (27-28)/(12-14) 27/14  Intake/Output from previous day: 02/23 0701 - 02/24 0700 In: 889.4 [P.O.:850; I.V.:39.4] Out: 1095 [Urine:1035; Chest Tube:60] Intake/Output this shift: No intake/output data recorded.  General appearance: alert, cooperative, and no distress Neurologic: intact Heart: regular rate and rhythm Lungs: clear to auscultation bilaterally Abdomen: normal findings: soft, non-tender  Lab Results: Recent Labs    04/05/21 1544 04/06/21 0500  WBC 8.7 7.5  HGB 10.7* 9.8*  HCT 31.3* 28.7*  PLT 92* 82*   BMET:  Recent Labs    04/05/21 1544 04/06/21 0500  NA 134* 134*  K 3.8 3.9  CL 103 106  CO2 23 23  GLUCOSE 111* 104*  BUN 8 10  CREATININE 0.92 0.77  CALCIUM 8.4* 8.5*    PT/INR:  Recent Labs    04/06/21 0500  LABPROT 18.0*  INR 1.5*   ABG    Component Value Date/Time   PHART 7.358 04/04/2021 1614   HCO3 22.0 04/04/2021 1614   TCO2 23 04/04/2021 1614   ACIDBASEDEF 3.0 (H) 04/04/2021 1614   O2SAT 90 04/04/2021 1614   CBG (last 3)  Recent Labs    04/05/21 2057 04/05/21 2347 04/06/21 0416  GLUCAP 114* 111* 106*    Assessment/Plan: S/P Procedure(s) (LRB): AORTIC VALVE REPLACEMENT (AVR) USING ON-X VALVE SIZE (N/A) TRANSESOPHAGEAL ECHOCARDIOGRAM (TEE) (N/A) Plan for transfer to step-down: see transfer  orders Doing well POD # 2 NEURO- intact CV- in SR  Warfarin for mechanical valve, INR 1.5 this AM  Metoprolol RESP- IS RENAL- creatinine and lytes Ok ENDO- CBG well controlled- dc [facility] GI- tolerating diet Continue ambulation   LOS: 2 days    Loreli Slot 04/06/2021

## 2021-04-06 NOTE — Progress Notes (Addendum)
CT surgery PM rounds  Patient sitting in chair comfortable Waiting for transfer to progressive care  Blood pressure  120/90  pulse 78, temperature 97.8 F (36.6 C), temperature source Oral, resp. rate (!) 25, height 6\' 2"  (1.88 m), weight 84.5 kg, SpO2 99 %.

## 2021-04-07 ENCOUNTER — Inpatient Hospital Stay (HOSPITAL_COMMUNITY): Payer: Managed Care, Other (non HMO)

## 2021-04-07 LAB — BASIC METABOLIC PANEL
Anion gap: 8 (ref 5–15)
BUN: 8 mg/dL (ref 6–20)
CO2: 21 mmol/L — ABNORMAL LOW (ref 22–32)
Calcium: 8.9 mg/dL (ref 8.9–10.3)
Chloride: 108 mmol/L (ref 98–111)
Creatinine, Ser: 0.79 mg/dL (ref 0.61–1.24)
GFR, Estimated: >60 mL/min (ref >60)
Glucose, Bld: 90 mg/dL (ref 70–99)
Potassium: 4.1 mmol/L (ref 3.5–5.1)
Sodium: 137 mmol/L (ref 135–145)

## 2021-04-07 LAB — CBC
HCT: 29.1 % — ABNORMAL LOW (ref 39.0–52.0)
Hemoglobin: 9.8 g/dL — ABNORMAL LOW (ref 13.0–17.0)
MCH: 33.3 pg (ref 26.0–34.0)
MCHC: 33.7 g/dL (ref 30.0–36.0)
MCV: 99 fL (ref 80.0–100.0)
Platelets: 79 10*3/uL — ABNORMAL LOW (ref 150–400)
RBC: 2.94 MIL/uL — ABNORMAL LOW (ref 4.22–5.81)
RDW: 13 % (ref 11.5–15.5)
WBC: 7.5 10*3/uL (ref 4.0–10.5)
nRBC: 0 % (ref 0.0–0.2)

## 2021-04-07 LAB — PROTIME-INR
INR: 1.4 — ABNORMAL HIGH (ref 0.8–1.2)
Prothrombin Time: 16.7 seconds — ABNORMAL HIGH (ref 11.4–15.2)

## 2021-04-07 NOTE — Progress Notes (Signed)
Pt arrived to 4e from 2h. Pt oriented to room and staff. Vitals obtained. Telemetry box applied and CCMD notified. Pt denies pain at this time.

## 2021-04-07 NOTE — Progress Notes (Signed)
3 Days Post-Op Procedure(s) (LRB): AORTIC VALVE REPLACEMENT (AVR) USING ON-X VALVE SIZE (N/A) TRANSESOPHAGEAL ECHOCARDIOGRAM (TEE) (N/A) Subjective: Feeling stronger Nsr cxr today clear  Objective: Vital signs in last 24 hours: Temp:  [97.5 F (36.4 C)-98.6 F (37 C)] 97.5 F (36.4 C) (02/25 1139) Pulse Rate:  [64-77] 74 (02/25 1100) Cardiac Rhythm: Normal sinus rhythm (02/25 0715) Resp:  [14-25] 23 (02/25 1100) BP: (102-134)/(71-111) 107/73 (02/25 1100) SpO2:  [93 %-99 %] 96 % (02/25 1000) Weight:  [85.1 kg] 85.1 kg (02/25 0500)  Hemodynamic parameters for last 24 hours:    Intake/Output from previous day: 02/24 0701 - 02/25 0700 In: 240 [P.O.:240] Out: 400 [Urine:400] Intake/Output this shift: Total I/O In: 240 [P.O.:240] Out: 500 [Urine:500]       Exam    General- alert and comfortable    Neck- no JVD, no cervical adenopathy palpable, no carotid bruit   Lungs- clear without rales, wheezes   Cor- regular rate and rhythm, no murmur , gallop   Abdomen- soft, non-tender   Extremities - warm, non-tender, minimal edema   Neuro- oriented, appropriate, no focal weakness   Lab Results: Recent Labs    04/06/21 0500 04/07/21 0438  WBC 7.5 7.5  HGB 9.8* 9.8*  HCT 28.7* 29.1*  PLT 82* 79*   BMET:  Recent Labs    04/06/21 0500 04/07/21 0438  NA 134* 137  K 3.9 4.1  CL 106 108  CO2 23 21*  GLUCOSE 104* 90  BUN 10 8  CREATININE 0.77 0.79  CALCIUM 8.5* 8.9    PT/INR:  Recent Labs    04/07/21 0438  LABPROT 16.7*  INR 1.4*   ABG    Component Value Date/Time   PHART 7.358 04/04/2021 1614   HCO3 22.0 04/04/2021 1614   TCO2 23 04/04/2021 1614   ACIDBASEDEF 3.0 (H) 04/04/2021 1614   O2SAT 90 04/04/2021 1614   CBG (last 3)  Recent Labs    04/06/21 0851 04/06/21 1142 04/06/21 1519  GLUCAP 123* 103* 91    Assessment/Plan: S/P Procedure(s) (LRB): AORTIC VALVE REPLACEMENT (AVR) USING ON-X VALVE SIZE (N/A) TRANSESOPHAGEAL ECHOCARDIOGRAM  (TEE) (N/A) Doing well POD 3 after mechanical AVR INR 1.5 getting loaded with coumadin Plts gradually dropping 90> 80> 70, will stop lovenox and follow Waiting for 4E bed  LOS: 3 days    Isaac Diaz 04/07/2021

## 2021-04-08 ENCOUNTER — Inpatient Hospital Stay (HOSPITAL_COMMUNITY): Payer: Managed Care, Other (non HMO)

## 2021-04-08 LAB — BASIC METABOLIC PANEL
Anion gap: 11 (ref 5–15)
Anion gap: 11 (ref 5–15)
BUN: 9 mg/dL (ref 6–20)
BUN: 9 mg/dL (ref 6–20)
CO2: 24 mmol/L (ref 22–32)
CO2: 22 mmol/L (ref 22–32)
Calcium: 9.5 mg/dL (ref 8.9–10.3)
Calcium: 9.2 mg/dL (ref 8.9–10.3)
Chloride: 103 mmol/L (ref 98–111)
Chloride: 104 mmol/L (ref 98–111)
Creatinine, Ser: 0.92 mg/dL (ref 0.61–1.24)
Creatinine, Ser: 0.93 mg/dL (ref 0.61–1.24)
GFR, Estimated: >60 mL/min (ref >60)
GFR, Estimated: >60 mL/min (ref >60)
Glucose, Bld: 104 mg/dL — ABNORMAL HIGH (ref 70–99)
Glucose, Bld: 136 mg/dL — ABNORMAL HIGH (ref 70–99)
Potassium: 3.9 mmol/L (ref 3.5–5.1)
Potassium: 3.9 mmol/L (ref 3.5–5.1)
Sodium: 138 mmol/L (ref 135–145)
Sodium: 137 mmol/L (ref 135–145)

## 2021-04-08 LAB — CBC
HCT: 34 % — ABNORMAL LOW (ref 39.0–52.0)
Hemoglobin: 11.4 g/dL — ABNORMAL LOW (ref 13.0–17.0)
MCH: 32.7 pg (ref 26.0–34.0)
MCHC: 33.5 g/dL (ref 30.0–36.0)
MCV: 97.4 fL (ref 80.0–100.0)
Platelets: 148 10*3/uL — ABNORMAL LOW (ref 150–400)
RBC: 3.49 MIL/uL — ABNORMAL LOW (ref 4.22–5.81)
RDW: 12.7 % (ref 11.5–15.5)
WBC: 7.9 10*3/uL (ref 4.0–10.5)
nRBC: 0 % (ref 0.0–0.2)

## 2021-04-08 LAB — PROTIME-INR
INR: 1.3 — ABNORMAL HIGH (ref 0.8–1.2)
Prothrombin Time: 15.9 seconds — ABNORMAL HIGH (ref 11.4–15.2)

## 2021-04-08 MED ORDER — LEVALBUTEROL HCL 1.25 MG/0.5ML IN NEBU: 1.2500 mg | INHALATION_SOLUTION | Freq: Four times a day (QID) | RESPIRATORY_TRACT | Status: DC | PRN

## 2021-04-08 MED ORDER — AMIODARONE LOAD VIA INFUSION
150.0000 mg | Freq: Once | INTRAVENOUS | Status: AC
Start: 2021-04-08 — End: 2021-04-08
  Administered 2021-04-08: 150 mg via INTRAVENOUS
  Filled 2021-04-08: qty 83.34

## 2021-04-08 MED ORDER — AMIODARONE HCL IN DEXTROSE 360-4.14 MG/200ML-% IV SOLN
60.0000 mg/h | INTRAVENOUS | Status: DC
  Administered 2021-04-08 (×2): 60 mg/h via INTRAVENOUS
  Filled 2021-04-08: qty 200

## 2021-04-08 MED ORDER — AMIODARONE HCL IN DEXTROSE 360-4.14 MG/200ML-% IV SOLN
30.0000 mg/h | INTRAVENOUS | Status: DC
  Administered 2021-04-08 – 2021-04-09 (×3): 30 mg/h via INTRAVENOUS
  Filled 2021-04-08 (×6): qty 200

## 2021-04-08 MED ORDER — ALBUTEROL SULFATE (2.5 MG/3ML) 0.083% IN NEBU
2.5000 mg | INHALATION_SOLUTION | RESPIRATORY_TRACT | Status: DC | PRN
  Administered 2021-04-08: 2.5 mg via RESPIRATORY_TRACT

## 2021-04-08 MED ORDER — LEVALBUTEROL HCL 1.25 MG/0.5ML IN NEBU
1.2500 mg | INHALATION_SOLUTION | Freq: Four times a day (QID) | RESPIRATORY_TRACT | Status: DC
  Administered 2021-04-08 – 2021-04-09 (×6): 1.25 mg via RESPIRATORY_TRACT
  Filled 2021-04-08 (×6): qty 0.5

## 2021-04-08 MED ORDER — FUROSEMIDE 10 MG/ML IJ SOLN
40.0000 mg | Freq: Once | INTRAMUSCULAR | Status: AC
  Administered 2021-04-08: 40 mg via INTRAVENOUS

## 2021-04-08 MED ORDER — POTASSIUM CHLORIDE CRYS ER 20 MEQ PO TBCR
20.0000 meq | EXTENDED_RELEASE_TABLET | Freq: Every day | ORAL | Status: DC
  Administered 2021-04-08: 20 meq via ORAL
  Filled 2021-04-08: qty 1

## 2021-04-08 MED ORDER — FUROSEMIDE 40 MG PO TABS
40.0000 mg | ORAL_TABLET | Freq: Every day | ORAL | Status: DC
  Administered 2021-04-08 – 2021-04-11 (×4): 40 mg via ORAL
  Filled 2021-04-08 (×4): qty 1

## 2021-04-08 MED ORDER — FUROSEMIDE 10 MG/ML IJ SOLN
INTRAMUSCULAR | Status: AC
  Filled 2021-04-08: qty 4

## 2021-04-08 MED ORDER — WARFARIN SODIUM 5 MG PO TABS
5.0000 mg | ORAL_TABLET | Freq: Every day | ORAL | Status: DC
  Administered 2021-04-08: 5 mg via ORAL
  Filled 2021-04-08: qty 1

## 2021-04-08 MED ORDER — METOPROLOL TARTRATE 5 MG/5ML IV SOLN
5.0000 mg | Freq: Once | INTRAVENOUS | Status: AC
  Administered 2021-04-08: 5 mg via INTRAVENOUS

## 2021-04-08 MED ORDER — FUROSEMIDE 10 MG/ML IJ SOLN
20.0000 mg | Freq: Once | INTRAMUSCULAR | Status: AC
Start: 2021-04-08 — End: 2021-04-08
  Administered 2021-04-08: 20 mg via INTRAVENOUS
  Filled 2021-04-08: qty 2

## 2021-04-08 NOTE — Progress Notes (Addendum)
[address]       [employer] [id number]             [phone]      4 Days Post-Op Procedure(s) (LRB): AORTIC VALVE REPLACEMENT (AVR) USING ON-X VALVE SIZE 23MM (N/A) TRANSESOPHAGEAL ECHOCARDIOGRAM (TEE) (N/A) Subjective: Some afib overnight, + SOB improving  Objective: Vital signs in last 24 hours: Temp:  [97.5 F (36.4 C)-100.2 F (37.9 C)] 98.3 F (36.8 C) (02/26 0600) Pulse Rate:  [64-96] 65 (02/26 0600) Cardiac Rhythm: Normal sinus rhythm (02/26 0533) Resp:  [18-28] 19 (02/26 0600) BP: (94-169)/(70-89) 94/70 (02/26 0600) SpO2:  [92 %-100 %] 96 % (02/26 0600) Weight:  [85 kg-85.1 kg] 85 kg (02/26 0408)  Hemodynamic parameters for last 24 hours:    Intake/Output from previous day: 02/25 0701 - 02/26 0700 In: 723 [P.O.:720; I.V.:3] Out: 500 [Urine:500] Intake/Output this shift: No intake/output data recorded.  General appearance: alert, cooperative, and no distress Heart: regular rate and rhythm and no murmur Lungs: dim in bases Abdomen: benign Extremities: no edema Wound: incis healing well  Lab Results: Recent Labs    04/07/21 0438 04/08/21 0227  WBC 7.5 7.9  HGB 9.8* 11.4*  HCT 29.1* 34.0*  PLT 79* 148*   BMET:  Recent Labs    04/08/21 0227 04/08/21 0450  NA 138 137  K 3.9 3.9  CL 103 104  CO2 24 22  GLUCOSE 104* 136*  BUN 9 9  CREATININE 0.92 0.93  CALCIUM 9.5 9.2    PT/INR:  Recent Labs    04/08/21 0227  LABPROT 15.9*  INR 1.3*   ABG    Component Value Date/Time   PHART 7.358 04/04/2021 1614   HCO3 22.0 04/04/2021 1614   TCO2 23 04/04/2021 1614   ACIDBASEDEF 3.0 (H) 04/04/2021 1614   O2SAT 90 04/04/2021 1614   CBG (last 3)  Recent Labs    04/06/21 0851 04/06/21 1142 04/06/21 1519  GLUCAP 123* 103* 91    Meds Scheduled Meds:  acetaminophen  1,000 mg Oral Q6H   Or   acetaminophen (TYLENOL) oral liquid 160 mg/5 mL  1,000 mg Per Tube Q6H   aspirin EC  81 mg Oral Daily   bisacodyl  10 mg Oral  Daily   Or   bisacodyl  10 mg Rectal Daily   Chlorhexidine Gluconate Cloth  6 each Topical Daily   [facility], Patient & Family Education   Does not apply Once   docusate sodium  200 mg Oral Daily   furosemide       furosemide  40 mg Oral Daily   mouth rinse  15 mL Mouth Rinse BID   metoprolol succinate  25 mg Oral Daily   mupirocin ointment   Nasal BID   pantoprazole  40 mg Oral Daily   potassium chloride  20 mEq Oral Daily   sodium chloride flush  3 mL Intravenous Q12H   warfarin  5 mg Oral q1600   Warfarin - Physician Dosing Inpatient   Does not apply q1600   Continuous Infusions:  sodium chloride     amiodarone 60 mg/hr (04/08/21 0622)   Followed by   amiodarone     PRN Meds:.sodium chloride, albuterol, alum & mag hydroxide-simeth, ketorolac, magnesium hydroxide, metoprolol tartrate, morphine injection, ondansetron (ZOFRAN) IV, oxyCODONE, sodium chloride flush  Xrays DG Chest 2 View  Result Date: 04/07/2021 CLINICAL DATA:  Follow-up, status post aortic valve replacement. EXAM: CHEST - 2 VIEW  COMPARISON:  04/06/2021 and older exams. FINDINGS: All lines and tubes have been removed. Cardiac silhouette normal in size.  No mediastinal widening. Opacities at the lung bases have mildly improved. Interstitial thickening is similar to the previous day's study. No new lung abnormalities. No pneumothorax. IMPRESSION: 1. Mild interval improvement with a decrease in lung base opacities consistent with decreased pleural effusions and associated lung base atelectasis or edema. 2. Persistent bilateral station thickening suggests mild residual interstitial pulmonary edema. Electronically Signed   By: Lajean Manes M.D.   On: 04/07/2021 08:01   DG CHEST PORT 1 VIEW  Result Date: 04/08/2021 CLINICAL DATA:  Dyspnea EXAM: PORTABLE CHEST 1 VIEW COMPARISON:  04/07/2021 FINDINGS: The lungs are symmetrically well expanded. Interval development of small bilateral pleural effusions. Mild  interstitial pulmonary edema persists, possibly representing mild cardiogenic failure. No pneumothorax. Median sternotomy has been performed. Cardiac size is within normal limits. No acute bone abnormality. IMPRESSION: Stable perihilar interstitial pulmonary edema possibly representing mild cardiogenic failure. Enlarging bilateral pleural effusions. Electronically Signed   By: Fidela Salisbury M.D.   On: 04/08/2021 02:14    Assessment/Plan: S/P Procedure(s) (LRB): AORTIC VALVE REPLACEMENT (AVR) USING ON-X VALVE SIZE 23MM (N/A) TRANSESOPHAGEAL ECHOCARDIOGRAM (TEE) (N/A) POD#3  1 Tmax 100.2, s BP 90's -160's, sinus rhythm, some supraventricular tach/afib- started on amio gtt 2 sats good on 2 liters 3 weight stable, about 3 KG>preop, normal renal fxn 4 expected ABLA, improved 5 INR 1.3- increase coumadin dose to 5 mg 6 CXR- stable pulm edema pattern, received 40 IV lasix at 02:15 - will add po daily for now 7 Thrombocytopenia improved, 148K today 8 BS well controlled- not a diabetic 9 routine pulm hygiene and cardiac rehab       LOS: 4 days    John Giovanni PA-C Pager [phone] 04/08/2021   Patient feels better after diuresis and receiving albuterol nebulized therapy last night.  Chest x-ray this a.m. shows interstitial edema.  He has converted to sinus rhythm on IV amiodarone which we will continue until tomorrow and transition to p.o.  Continue diuresis pulmonary toilet and nebulized therapy.  patient examined and medical record reviewed,agree with above note. Dahlia Byes 04/08/2021

## 2021-04-08 NOTE — Plan of Care (Signed)
°  Problem: Education: Goal: Will demonstrate proper wound care and an understanding of methods to prevent future damage Outcome: Progressing Goal: Knowledge of disease or condition will improve Outcome: Progressing Goal: Knowledge of the prescribed therapeutic regimen will improve Outcome: Progressing Goal: Individualized Educational Video(s) Outcome: Progressing   Problem: Activity: Goal: Risk for activity intolerance will decrease Outcome: Progressing   Problem: Cardiac: Goal: Will achieve and/or maintain hemodynamic stability Outcome: Progressing   Problem: Clinical Measurements: Goal: Postoperative complications will be avoided or minimized Outcome: Progressing   Problem: Urinary Elimination: Goal: Ability to achieve and maintain adequate renal perfusion and functioning will improve Outcome: Progressing   Problem: Skin Integrity: Goal: Wound healing without signs and symptoms of infection Outcome: Progressing Goal: Risk for impaired skin integrity will decrease Outcome: Progressing

## 2021-04-08 NOTE — Progress Notes (Signed)
04/08/2021 0115 Dr Donata Clay paged.  Orders received. Kathryne Hitch

## 2021-04-08 NOTE — Progress Notes (Signed)
04/08/2021 0100 Pt with some difficulty breathing, "states my lungs hurt"  O2 sats are 96-98% on 2L other VS WNL.  Lungs sounds are rhonchi thoughout with some fine crackles in bases.  Administered pain meds and encouraged IS.  Will page Dr. Prescott Gum. Carney [place]ers

## 2021-04-09 LAB — BASIC METABOLIC PANEL
Anion gap: 10 (ref 5–15)
BUN: 12 mg/dL (ref 6–20)
CO2: 22 mmol/L (ref 22–32)
Calcium: 8.9 mg/dL (ref 8.9–10.3)
Chloride: 104 mmol/L (ref 98–111)
Creatinine, Ser: 0.88 mg/dL (ref 0.61–1.24)
GFR, Estimated: >60 mL/min (ref >60)
Glucose, Bld: 118 mg/dL — ABNORMAL HIGH (ref 70–99)
Potassium: 3.9 mmol/L (ref 3.5–5.1)
Sodium: 136 mmol/L (ref 135–145)

## 2021-04-09 LAB — PROTIME-INR
INR: 1.4 — ABNORMAL HIGH (ref 0.8–1.2)
Prothrombin Time: 17.5 seconds — ABNORMAL HIGH (ref 11.4–15.2)

## 2021-04-09 LAB — CBC
HCT: 30.2 % — ABNORMAL LOW (ref 39.0–52.0)
Hemoglobin: 10.3 g/dL — ABNORMAL LOW (ref 13.0–17.0)
MCH: 32.6 pg (ref 26.0–34.0)
MCHC: 34.1 g/dL (ref 30.0–36.0)
MCV: 95.6 fL (ref 80.0–100.0)
Platelets: 187 10*3/uL (ref 150–400)
RBC: 3.16 MIL/uL — ABNORMAL LOW (ref 4.22–5.81)
RDW: 12.6 % (ref 11.5–15.5)
WBC: 7.3 10*3/uL (ref 4.0–10.5)
nRBC: 0 % (ref 0.0–0.2)

## 2021-04-09 LAB — TSH: TSH: 2.871 u[IU]/mL (ref 0.350–4.500)

## 2021-04-09 LAB — MAGNESIUM: Magnesium: 2 mg/dL (ref 1.7–2.4)

## 2021-04-09 MED ORDER — AMIODARONE HCL 200 MG PO TABS
200.0000 mg | ORAL_TABLET | Freq: Two times a day (BID) | ORAL | Status: DC
  Administered 2021-04-09: 200 mg via ORAL
  Filled 2021-04-09: qty 1

## 2021-04-09 MED ORDER — AMIODARONE HCL 200 MG PO TABS
400.0000 mg | ORAL_TABLET | Freq: Two times a day (BID) | ORAL | Status: DC
  Administered 2021-04-09: 400 mg via ORAL
  Administered 2021-04-09: 200 mg via ORAL
  Administered 2021-04-10 – 2021-04-11 (×3): 400 mg via ORAL
  Filled 2021-04-09 (×5): qty 2

## 2021-04-09 MED ORDER — WARFARIN SODIUM 7.5 MG PO TABS
7.5000 mg | ORAL_TABLET | Freq: Once | ORAL | Status: AC
  Administered 2021-04-09: 7.5 mg via ORAL
  Filled 2021-04-09: qty 1

## 2021-04-09 MED ORDER — POTASSIUM CHLORIDE CRYS ER 20 MEQ PO TBCR
20.0000 meq | EXTENDED_RELEASE_TABLET | Freq: Every day | ORAL | Status: DC
  Administered 2021-04-10: 20 meq via ORAL
  Filled 2021-04-09: qty 1

## 2021-04-09 MED ORDER — METOLAZONE 5 MG PO TABS
5.0000 mg | ORAL_TABLET | Freq: Every day | ORAL | Status: AC
  Administered 2021-04-09: 5 mg via ORAL
  Filled 2021-04-09: qty 1

## 2021-04-09 MED ORDER — GUAIFENESIN ER 600 MG PO TB12
600.0000 mg | ORAL_TABLET | Freq: Two times a day (BID) | ORAL | Status: DC
  Administered 2021-04-09 – 2021-04-11 (×5): 600 mg via ORAL
  Filled 2021-04-09 (×5): qty 1

## 2021-04-09 MED ORDER — POTASSIUM CHLORIDE CRYS ER 20 MEQ PO TBCR
20.0000 meq | EXTENDED_RELEASE_TABLET | Freq: Two times a day (BID) | ORAL | Status: AC
  Administered 2021-04-09 (×2): 20 meq via ORAL
  Filled 2021-04-09 (×2): qty 1

## 2021-04-09 MED ORDER — LIDOCAINE 5 % EX PTCH
1.0000 | MEDICATED_PATCH | CUTANEOUS | Status: DC
  Administered 2021-04-10: 1 via TRANSDERMAL
  Filled 2021-04-09 (×2): qty 1

## 2021-04-09 NOTE — Progress Notes (Addendum)
° °   °  [address]       [place] [address]             [phone]        5 Days Post-Op Procedure(s) (LRB): AORTIC VALVE REPLACEMENT (AVR) USING ON-X VALVE SIZE (N/A) TRANSESOPHAGEAL ECHOCARDIOGRAM (TEE) (N/A)  Subjective: Patient states "hurts when he coughs".  Objective: Vital signs in last 24 hours: Temp:  [97.7 F (36.5 C)-99.8 F (37.7 C)] 98.1 F (36.7 C) (02/27 0418) Pulse Rate:  [62-72] 64 (02/27 0418) Cardiac Rhythm: Normal sinus rhythm (02/26 1900) Resp:  [18-21] 19 (02/27 0418) BP: (110-138)/(71-90) 134/74 (02/27 0418) SpO2:  [90 %-98 %] 93 % (02/27 0418)  Pre op weight 82.1 kg Current Weight  04/08/21 85 kg      Intake/Output from previous day: 02/26 0701 - 02/27 0700 In: 302.4 [I.V.:302.4] Out: -    Physical Exam:  Cardiovascular: RRR, sharp valve click Pulmonary: Slightly diminished bibasilar breath sounds R>L Abdomen: Soft, non tender, bowel sounds present. Extremities: No lower extremity edema. Wounds: Clean and dry.  No erythema or signs of infection.  Lab Results: CBC: Recent Labs    04/08/21 0227 04/09/21 0225  WBC 7.9 7.3  HGB 11.4* 10.3*  HCT 34.0* 30.2*  PLT 148* 187   BMET:  Recent Labs    04/08/21 0450 04/09/21 0225  NA 137 136  K 3.9 3.9  CL 104 104  CO2 22 22  GLUCOSE 136* 118*  BUN 9 12  CREATININE 0.93 0.88  CALCIUM 9.2 8.9    PT/INR:  Lab Results  Component Value Date   INR 1.4 (H) 04/09/2021   INR 1.3 (H) 04/08/2021   INR 1.4 (H) 04/07/2021   ABG:  INR: Will add last result for INR, ABG once components are confirmed Will add last 4 CBG results once components are confirmed  Assessment/Plan:  1. CV - Had SVT/afib over weekend. SR with HR in the 60's this am. INR  this am 1.4. On Amiodarone drip, Toprol XL 25 mg daily, and Coumadin. Will transition to oral Amiodarone 200 mg bid (HR in 60's and on Toprol XL). Continue with 5 mg of Coumadin as will see first result of this dose in  am.  2.  Pulmonary - On room air. Mucinex for cough. Encourage incentive spirometer and flutter valve 3. Volume Overload - On Lasix 40 mg daily. Will give Zaroxolyn in addition this am 4.  Expected post op acute blood loss anemia - H and H this am decreased to 10.3 and 30.2 5. Thrombocytopenia resolved as platelets up to 187,000 6. Supplement potassium 7. Likely remove EPW later today  Donielle M ZimmermanPA-C 04/09/2021,7:00 AM   Patient seen and examined, agree with above Will dc pacing wires Will give 7.5 mg coumadin one time this evening  Viviann Spare C. Dorris Fetch, MD [facility] [phone]

## 2021-04-09 NOTE — Progress Notes (Signed)
CARDIAC REHAB PHASE I   PRE:  Rate/Rhythm: 72 SR    BP: sitting 141/93    SaO2: 92 RA  MODE:  Ambulation: to BR   Pt in significant pain currently, rates 5/10. Hadn't taken pain meds today therefore RN giving meds. Pt moved out of bed following sternal precautions and walked with standby assist to BR. Will f/u later after pain control. [id number]  [place], ACSM 04/09/2021 10:07 AM

## 2021-04-09 NOTE — Progress Notes (Signed)
Nurse just called to inform me that patient went back in a fib with HR in the 110's. I increased oral Amiodarone to 400 mg bid (BP/HR improved). Nurse states he is in [place] now in the 90's. She is to notify me if rate remains not well controlled as will have to consider IV Amiodarone again. Also, patient has complaints of sternal pain so ordered Lidocaine patch.

## 2021-04-09 NOTE — Progress Notes (Signed)
Mobility Specialist Progress Note  Pt inappropriate for mobility specialist at this time given precautions as advised by RN in order to observe HR. Mobility specialist to hold today, will continue to follow for readiness.  Holland Falling Mobility Specialist Phone Number [phone]

## 2021-04-09 NOTE — Progress Notes (Signed)
CARDIAC REHAB PHASE I   PRE:  Rate/Rhythm: 74 SR    BP: sitting 124/81    SaO2: 92 RA  MODE:  Ambulation: 470 ft   POST:  Rate/Rhythm: 90 SR with PACs    BP: sitting 145/91     SaO2: 92 RA  Pt sts his sternal pain is less but now 6/10 left chest pain. Moved to EOB independently and min assist to stand due to deep position in bed. Steady in hall but trembling due to pain. Pt wanted to walk whole hallway. One bout of sudden coughing and had to rest, grabbing the railing. Increased PACs walking, no noted afib. To EOB. RN to pull wires. Aware of pts pain. [phone]   Harriet Masson CES, ACSM 04/09/2021 12:04 PM

## 2021-04-10 LAB — CBC
HCT: 34 % — ABNORMAL LOW (ref 39.0–52.0)
Hemoglobin: 11.5 g/dL — ABNORMAL LOW (ref 13.0–17.0)
MCH: 32.2 pg (ref 26.0–34.0)
MCHC: 33.8 g/dL (ref 30.0–36.0)
MCV: 95.2 fL (ref 80.0–100.0)
Platelets: 258 10*3/uL (ref 150–400)
RBC: 3.57 MIL/uL — ABNORMAL LOW (ref 4.22–5.81)
RDW: 12.6 % (ref 11.5–15.5)
WBC: 7.1 10*3/uL (ref 4.0–10.5)
nRBC: 0 % (ref 0.0–0.2)

## 2021-04-10 LAB — PROTIME-INR
INR: 1.6 — ABNORMAL HIGH (ref 0.8–1.2)
Prothrombin Time: 18.7 seconds — ABNORMAL HIGH (ref 11.4–15.2)

## 2021-04-10 MED ORDER — ASPIRIN 81 MG PO TBEC: 81.0000 mg | DELAYED_RELEASE_TABLET | Freq: Every day | ORAL | 11 refills | Status: DC

## 2021-04-10 MED ORDER — CYCLOBENZAPRINE HCL 10 MG PO TABS
5.0000 mg | ORAL_TABLET | Freq: Two times a day (BID) | ORAL | Status: DC | PRN
  Administered 2021-04-10 – 2021-04-11 (×2): 5 mg via ORAL
  Filled 2021-04-10 (×2): qty 1

## 2021-04-10 MED ORDER — LEVALBUTEROL HCL 1.25 MG/0.5ML IN NEBU
1.2500 mg | INHALATION_SOLUTION | Freq: Two times a day (BID) | RESPIRATORY_TRACT | Status: DC
  Administered 2021-04-10 – 2021-04-11 (×3): 1.25 mg via RESPIRATORY_TRACT
  Filled 2021-04-10 (×3): qty 0.5

## 2021-04-10 MED ORDER — WARFARIN SODIUM 7.5 MG PO TABS
7.5000 mg | ORAL_TABLET | Freq: Once | ORAL | Status: AC
  Administered 2021-04-10: 7.5 mg via ORAL
  Filled 2021-04-10: qty 1

## 2021-04-10 MED ORDER — WARFARIN SODIUM 5 MG PO TABS: 5.0000 mg | ORAL_TABLET | Freq: Once | ORAL | Status: DC

## 2021-04-10 MED ORDER — HYALURONIDASE OVINE 200 UNIT/ML IJ SOLN
150.0000 [IU] | Freq: Once | INTRAMUSCULAR | Status: DC
  Filled 2021-04-10 (×2): qty 0.75

## 2021-04-10 NOTE — Progress Notes (Signed)
CARDIAC REHAB PHASE I   PRE:  Rate/Rhythm: 79 SR    BP: sitting 140/86    SaO2: 93 RA  MODE:  Ambulation: 770 ft   POST:  Rate/Rhythm: 90 SR    BP: sitting 138/93     SaO2: 93 RA   Pt pain much improved. Moved out of bed and ambulated independently. No major c/o. VSS. To recliner. Encouraged IS and walking independently. [phone]  Harriet Masson CES, ACSM 04/10/2021 1:30 PM

## 2021-04-10 NOTE — Progress Notes (Signed)
Removed epicardial pacing wires per order. Tips intact. Patient tolerated well. VSS. Educated patient of bedrest for one hour. Patient verbalized understanding. Call bell in reach.  Kenard Gower, RN

## 2021-04-10 NOTE — Progress Notes (Signed)
Patient c/o pain right arm where IV infiltrated. IV was removed prior to shift change. Upon assessment, area is swollen, painful to touch and firm. Applied heat pack.   Upon reassessment, area does not seem to be as firm but still patient c/o pain.   Will continue to monitor

## 2021-04-10 NOTE — Progress Notes (Signed)
Patient c/o muscle pain on left side of chest and back. Patient stated that, "it wasn't his heart". Patient also stated that, " it felt like his muscles were spasm".   Morphine given. Patient was resting comfortably before this RN left the room.   Will continue to monitor

## 2021-04-10 NOTE — Progress Notes (Addendum)
° °   °  [address]       [place] [address]             [phone]        6 Days Post-Op Procedure(s) (LRB): AORTIC VALVE REPLACEMENT (AVR) USING ON-X VALVE SIZE (N/A) TRANSESOPHAGEAL ECHOCARDIOGRAM (TEE) (N/A)  Subjective: Patient had a lot of sternal pain and left shoulder spasms last evening.He has pain in right forearm-Amiodarone was infused and IV infiltrated  Objective: Vital signs in last 24 hours: Temp:  [97.6 F (36.4 C)-99.1 F (37.3 C)] 98.1 F (36.7 C) (02/28 0415) Pulse Rate:  [69-91] 76 (02/27 2333) Cardiac Rhythm: Normal sinus rhythm;Atrial fibrillation (02/27 2200) Resp:  [13-26] 26 (02/28 0649) BP: (117-135)/(60-87) 126/87 (02/28 0415) SpO2:  [91 %-95 %] 94 % (02/28 0415) Weight:  [76.3 kg] 76.3 kg (02/28 0649)  Pre op weight 82.1 kg Current Weight  04/10/21 76.3 kg      Intake/Output from previous day: 02/27 0701 - 02/28 0700 In: 948.6 [P.O.:600; I.V.:348.6] Out: -    Physical Exam:  Cardiovascular: RRR, sharp valve click Pulmonary: Slightly diminished bibasilar breath sounds R>L Abdomen: Soft, non tender, bowel sounds present. Extremities: No lower extremity edema. Right forearm swollen, painful. No erythema present Wounds: Clean and dry.  No erythema or signs of infection.  Lab Results: CBC: Recent Labs    04/08/21 0227 04/09/21 0225  WBC 7.9 7.3  HGB 11.4* 10.3*  HCT 34.0* 30.2*  PLT 148* 187    BMET:  Recent Labs    04/08/21 0450 04/09/21 0225  NA 137 136  K 3.9 3.9  CL 104 104  CO2 22 22  GLUCOSE 136* 118*  BUN 9 12  CREATININE 0.93 0.88  CALCIUM 9.2 8.9     PT/INR:  Lab Results  Component Value Date   INR 1.6 (H) 04/10/2021   INR 1.4 (H) 04/09/2021   INR 1.3 (H) 04/08/2021   ABG:  INR: Will add last result for INR, ABG once components are confirmed Will add last 4 CBG results once components are confirmed  Assessment/Plan:  1. CV - Had SVT/afib over weekend. Had a run with HR in  the 140's+-? NSVT. SR with HR in the 70's this am. On Amiodarone 400 mg bid, Toprol XL 25 mg daily, and Coumadin.  He was given 7.5 mg last night. INR this am slightly increased to 1.6. Will continue with Coumadin 5 mg daily. 2.  Pulmonary - On room air. Mucinex for cough. Encourage incentive spirometer and flutter valve 3. Volume Overload - On Lasix 40 mg daily.  4.  Expected post op acute blood loss anemia - Last H and H this am 10.3 and 30.2 5. Regarding pain control, on Morphine IV PRN, Oxy PRN, and Lidocaine patch placed yesterday afternoon. Patient thinks it is more like muscle spasms. Will try Flexeril PRN 6. Will monitor right arm-no erythema but is swollen, painful secondary to IV infiltration/Amiodarone. 7. Remove EPW this am  Lelon Huh [facility] 04/10/2021,7:00 AM   Patient seen and examined, agree with above Will give 7.5 mg coumadin again today Probably home in AM if rhythm stable  Viviann Spare C. Dorris Fetch, MD [facility] [phone]

## 2021-04-10 NOTE — Plan of Care (Signed)
  Problem: Activity: Goal: Risk for activity intolerance will decrease Outcome: Progressing   Problem: Clinical Measurements: Goal: Postoperative complications will be avoided or minimized Outcome: Progressing   Problem: Respiratory: Goal: Respiratory status will improve Outcome: Progressing   

## 2021-04-11 LAB — BASIC METABOLIC PANEL
Anion gap: 13 (ref 5–15)
BUN: 13 mg/dL (ref 6–20)
CO2: 25 mmol/L (ref 22–32)
Calcium: 10.1 mg/dL (ref 8.9–10.3)
Chloride: 95 mmol/L — ABNORMAL LOW (ref 98–111)
Creatinine, Ser: 1.04 mg/dL (ref 0.61–1.24)
GFR, Estimated: >60 mL/min (ref >60)
Glucose, Bld: 112 mg/dL — ABNORMAL HIGH (ref 70–99)
Potassium: 3.9 mmol/L (ref 3.5–5.1)
Sodium: 133 mmol/L — ABNORMAL LOW (ref 135–145)

## 2021-04-11 LAB — CBC
HCT: 37.4 % — ABNORMAL LOW (ref 39.0–52.0)
Hemoglobin: 12.8 g/dL — ABNORMAL LOW (ref 13.0–17.0)
MCH: 32.1 pg (ref 26.0–34.0)
MCHC: 34.2 g/dL (ref 30.0–36.0)
MCV: 93.7 fL (ref 80.0–100.0)
Platelets: 351 10*3/uL (ref 150–400)
RBC: 3.99 MIL/uL — ABNORMAL LOW (ref 4.22–5.81)
RDW: 12.3 % (ref 11.5–15.5)
WBC: 6.9 10*3/uL (ref 4.0–10.5)
nRBC: 0 % (ref 0.0–0.2)

## 2021-04-11 LAB — PROTIME-INR
INR: 2.2 — ABNORMAL HIGH (ref 0.8–1.2)
Prothrombin Time: 24.6 seconds — ABNORMAL HIGH (ref 11.4–15.2)

## 2021-04-11 MED ORDER — GUAIFENESIN ER 600 MG PO TB12: 600.0000 mg | ORAL_TABLET | Freq: Two times a day (BID) | ORAL | Status: DC | PRN

## 2021-04-11 MED ORDER — AMIODARONE HCL 200 MG PO TABS: 400.0000 mg | ORAL_TABLET | Freq: Two times a day (BID) | ORAL | 1 refills | Status: DC

## 2021-04-11 MED ORDER — POTASSIUM CHLORIDE CRYS ER 20 MEQ PO TBCR
20.0000 meq | EXTENDED_RELEASE_TABLET | Freq: Two times a day (BID) | ORAL | Status: DC
  Administered 2021-04-11: 20 meq via ORAL
  Filled 2021-04-11: qty 1

## 2021-04-11 MED ORDER — METOPROLOL SUCCINATE ER 25 MG PO TB24
25.0000 mg | ORAL_TABLET | Freq: Every day | ORAL | 1 refills | Status: DC
Start: 2021-04-11 — End: 2021-05-01

## 2021-04-11 MED ORDER — OXYCODONE HCL 10 MG PO TABS: 10.0000 mg | ORAL_TABLET | Freq: Four times a day (QID) | ORAL | 0 refills | Status: DC | PRN

## 2021-04-11 MED ORDER — FUROSEMIDE 40 MG PO TABS: 40.0000 mg | ORAL_TABLET | Freq: Every day | ORAL | 0 refills | Status: DC

## 2021-04-11 MED ORDER — POTASSIUM CHLORIDE CRYS ER 20 MEQ PO TBCR
20.0000 meq | EXTENDED_RELEASE_TABLET | Freq: Every day | ORAL | 0 refills | Status: DC
Start: 2021-04-11 — End: 2021-05-01

## 2021-04-11 MED ORDER — WARFARIN SODIUM 5 MG PO TABS: 5.0000 mg | ORAL_TABLET | Freq: Every day | ORAL | 1 refills | Status: DC

## 2021-04-11 MED ORDER — LIDOCAINE 5 % EX PTCH: 1.0000 | MEDICATED_PATCH | CUTANEOUS | 0 refills | Status: DC

## 2021-04-11 NOTE — Progress Notes (Signed)
Discussed with pt and sister IS, sternal precautions, smoking cessation, vit k diet, exercise, and CRPII. Pt receptive. He wants to quit smoking but is afraid he won't be able to as "everyone smokes". Discussed methods and gave resources. Will refer to [place].  ?[phone] ?Yves Dill CES, ACSM ?10:46 AM ?04/11/2021 ? ?

## 2021-04-11 NOTE — Plan of Care (Signed)
  Problem: Education: Goal: Will demonstrate proper wound care and an understanding of methods to prevent future damage Outcome: Adequate for Discharge Goal: Knowledge of disease or condition will improve Outcome: Adequate for Discharge Goal: Knowledge of the prescribed therapeutic regimen will improve Outcome: Adequate for Discharge Goal: Individualized Educational Video(s) Outcome: Adequate for Discharge   

## 2021-04-11 NOTE — Progress Notes (Signed)
Patient given discharge instructions. Sister present. Telemetry box removed, CCMD notified. Patient taken by staff to vehicle by wheelchair. ? ?Kenard Gower, RN  ?

## 2021-04-11 NOTE — Progress Notes (Signed)
Discharge instructions (including medications) discussed with and copy provided to patient/caregiver 

## 2021-04-11 NOTE — Progress Notes (Addendum)
? ?   ?  [address] ?      [place] [address] ?            [phone]   ? ?  ? ? ?7 Days Post-Op Procedure(s) (LRB): ?AORTIC VALVE REPLACEMENT (AVR) USING ON-X VALVE SIZE (N/A) ?TRANSESOPHAGEAL ECHOCARDIOGRAM (TEE) (N/A) ? ?Subjective: ?Patient used ice for painful right forearm (infiltrated IV) which has helped. He still has some incisional, sternal pain. ? ?Objective: ?Vital signs in last 24 hours: ?Temp:  [97.7 ?F (36.5 ?C)-99.2 ?F (37.3 ?C)] 98.1 ?F (36.7 ?C) (03/01 0359) ?Pulse Rate:  [69-91] 77 (03/01 0359) ?Cardiac Rhythm: Normal sinus rhythm;Junctional rhythm;Bundle branch block (02/28 1900) ?Resp:  [16-22] 19 (03/01 0359) ?BP: (117-128)/(73-88) 124/81 (03/01 0359) ?SpO2:  [92 %-96 %] 93 % (03/01 0359) ?Weight:  [75.1 kg] 75.1 kg (03/01 0359) ? ?Pre op weight 82.1 kg ?Current Weight  ?04/11/21 75.1 kg  ? ?  ? ?Intake/Output from previous day: ?02/28 0701 - 03/01 0700 ?In: 900 [P.O.:900] ?Out: 0  ? ? ?Physical Exam: ? ?Cardiovascular: RRR, sharp valve click ?Pulmonary: Slightly diminished bibasilar breath sounds R>L ?Abdomen: Soft, non tender, bowel sounds present. ?Extremities: No lower extremity edema. Right forearm less swollen, still somewhat painful. No erythema present ?Wounds: Clean and dry.  No erythema or signs of infection. ? ?Lab Results: ?CBC: ?Recent Labs  ?  04/10/21 ?0131 04/11/21 ?0142  ?WBC 7.1 6.9  ?HGB 11.5* 12.8*  ?HCT 34.0* 37.4*  ?PLT 258 351  ? ? ?BMET:  ?Recent Labs  ?  04/09/21 ?0225 04/11/21 ?0142  ?NA 136 133*  ?K 3.9 3.9  ?CL 104 95*  ?CO2 22 25  ?GLUCOSE 118* 112*  ?BUN 12 13  ?CREATININE 0.88 1.04  ?CALCIUM 8.9 10.1  ? ?  ?PT/INR:  ?Lab Results  ?Component Value Date  ? INR 2.2 (H) 04/11/2021  ? INR 1.6 (H) 04/10/2021  ? INR 1.4 (H) 04/09/2021  ? ?ABG:  ?INR: ?Will add last result for INR, ABG once components are confirmed ?Will add last 4 CBG results once components are confirmed ? ?Assessment/Plan: ? ?1. CV - Had SVT/afib over weekend. HR up into the  120's briefly earlier this am but SR with HR in the 70's upon my examination. On Amiodarone 400 mg bid, Toprol XL 25 mg daily, and Coumadin.  He was given 7.5 mg last 2 nights. INR this am  increased from 1.6 to 2.2. Will likely continue with Coumadin 5 mg daily. ?2.  Pulmonary - On room air. Mucinex for cough. Encourage incentive spirometer and flutter valve ?3. Volume Overload - On Lasix 40 mg daily.  ?4.  Expected post op acute blood loss anemia - Last H and H this am 12.8 and 37.4 ?5. Regarding pain control, will continue, Oxy PRN and Lidocaine patch at discharge. ?6. Will monitor right arm-no erythema and is swollen, painful secondary to IV infiltration/Amiodarone. ?7. Supplement potassium ?8. Discharge ? ?Donielle M ZimmermanPA-C ?04/11/2021,7:00 AM ?  ?Patient seen and examined, agree with above ?Home today ?Follow up arranged ? ?Salvatore Decent Dorris Fetch, MD ?[facility] ?[phone] ? ? ?

## 2021-04-12 ENCOUNTER — Telehealth: Payer: Self-pay

## 2021-04-12 NOTE — Telephone Encounter (Signed)
FMLA/STD form completed and faxed to [employer] @[phone]. ?Beginning LOA 04/04/21 through approx. 07/02/21  ?

## 2021-04-19 ENCOUNTER — Telehealth: Payer: Self-pay | Admitting: Cardiology

## 2021-04-19 NOTE — Telephone Encounter (Signed)
Patient calling to follow up on short term disability paperwork that he says was sent to the office from his job. ?

## 2021-04-20 NOTE — Telephone Encounter (Signed)
Pt notified that office has not received paperwork for short term disability yet. Pt stated that he had to go to his job today to sign paperwork and would get it sent to our office. Pt is aware that there is a processing fee. Pt verbalized understanding.  ?

## 2021-05-01 ENCOUNTER — Telehealth: Payer: Self-pay

## 2021-05-01 ENCOUNTER — Other Ambulatory Visit (HOSPITAL_COMMUNITY)
Admission: RE | Admit: 2021-05-01 | Discharge: 2021-05-01 | Disposition: A | Discharge status: Home / Self Care | Payer: Managed Care, Other (non HMO) | Source: Ambulatory Visit | Attending: Cardiology | Admitting: Cardiology

## 2021-05-01 ENCOUNTER — Encounter: Payer: Self-pay | Admitting: Student

## 2021-05-01 ENCOUNTER — Other Ambulatory Visit: Payer: Self-pay

## 2021-05-01 ENCOUNTER — Ambulatory Visit (INDEPENDENT_AMBULATORY_CARE_PROVIDER_SITE_OTHER): Payer: Managed Care, Other (non HMO) | Admitting: Student

## 2021-05-01 ENCOUNTER — Ambulatory Visit (INDEPENDENT_AMBULATORY_CARE_PROVIDER_SITE_OTHER): Payer: Managed Care, Other (non HMO) | Admitting: *Deleted

## 2021-05-01 VITALS — BP 100/62 | HR 57 | Ht 74.0 in | Wt 178.0 lb

## 2021-05-01 DIAGNOSIS — I9789 Other postprocedural complications and disorders of the circulatory system, not elsewhere classified: Secondary | ICD-10-CM

## 2021-05-01 DIAGNOSIS — Z952 Presence of prosthetic heart valve: Secondary | ICD-10-CM | POA: Insufficient documentation

## 2021-05-01 DIAGNOSIS — Z5181 Encounter for therapeutic drug level monitoring: Secondary | ICD-10-CM

## 2021-05-01 DIAGNOSIS — Z7901 Long term (current) use of anticoagulants: Secondary | ICD-10-CM

## 2021-05-01 DIAGNOSIS — I4891 Unspecified atrial fibrillation: Secondary | ICD-10-CM

## 2021-05-01 LAB — PROTIME-INR
INR: 2.3 — ABNORMAL HIGH (ref 0.8–1.2)
Prothrombin Time: 25 seconds — ABNORMAL HIGH (ref 11.4–15.2)

## 2021-05-01 MED ORDER — AMIODARONE HCL 200 MG PO TABS: ORAL_TABLET | ORAL | 0 refills | Status: DC

## 2021-05-01 MED ORDER — METOPROLOL SUCCINATE ER 25 MG PO TB24: 12.5000 mg | ORAL_TABLET | Freq: Every day | ORAL | 3 refills | Status: DC

## 2021-05-01 NOTE — Patient Instructions (Signed)
INR 2.0 - 3.0 x 3 month then 1.5 - 2.0 for Onxy Aortic Valve ?Continue warfarin 5mg  daily ?Recheck in 2 wks ?

## 2021-05-01 NOTE — Patient Instructions (Addendum)
Medication Instructions:  ? ?Take Amiodarone 200 mg once a day for 30 days and then STOP, stop on 06/02/21 ? ? ?Take Aspirin 81 mg daily ? ? ?DECREASE Toprol XL to 12.5 mg daily (1/2 tablet) ? ? ?Labwork: ?None today ? ?Testing/Procedures: ? ?Keep Echo appointment on 05/18/21 at [facility] ? ? ?Follow-Up: ?Keep 05/28/21 with Dr.Branch at the [place] office at 3:40 pm - arrive 3:25 pm for check-in ? ? ?Any Other Special Instructions Will Be Listed Below (If Applicable). ? ? ?Edrick Oh, Coumadin Nurse, will call you Coumadin (Warfarin) instructions ? ?If you need a refill on your cardiac medications before your next appointment, please call your pharmacy. ? ?

## 2021-05-01 NOTE — Telephone Encounter (Signed)
Patient will have STAT PT/INR today at 3 pm and then follow up apt at 3:30 pm in cardiology clinic. ?

## 2021-05-01 NOTE — Progress Notes (Signed)
? ?Cardiology Office Note   ? ?Date:  05/01/2021  ? ?ID:  Isaac Diaz, DOB 03/18/1965, MRN [id number] ? ?PCP:  Patient, No Pcp Per (Inactive)  ?Cardiologist: Isaac Dolly, MD   ? ?Chief Complaint  ?Patient presents with  ? Hospitalization Follow-up  ?  S/p AVR  ? ? ?History of Present Illness:   ? ?Isaac Diaz is a 56 y.o. male with past medical history of bicuspid aortic valve/aortic stenosis and seasonal allergies who presents to the office today for follow-up from recent AVR. ? ?He was examined by Dr. Harl Diaz in 02/2021 and reported worsening dyspnea on exertion for the past few months. Given his progressive symptoms along with severe AS by recent echocardiogram, it was recommended to proceed with work-up for valve replacement with R/LHC. This was performed by Dr. Angelena Diaz on 03/16/2021 and showed no angiographic evidence of CAD and normal right heart pressures. He was referred to CT Surgery for consideration of surgical AVR. He did meet with Dr. Roxan Diaz and ultimately presented to [facility] on 04/04/2021 for aortic valve replacement using an On-X 23 mm mechanical valve. He did develop SVT/Atrial Fibrillation in the postoperative setting and was started on IV Amiodarone with conversion back to normal sinus rhythm. At the time of discharge, this was transitioned to 400 mg twice daily for 2 days then 200 mg twice daily for 2 weeks then 200 mg daily. He was continued on ASA 81 mg daily along with Coumadin being initiated. He was discharged home on 04/11/2021 and INR was at 2.2 at that time. He had a Coumadin appointment on 04/12/2021 but no showed for this. ? ?In talking with the patient today, he reports overall doing well since his recent hospitalization. He is still having some discomfort along his pectoral region but says this has improved and he is no longer having to take anything for pain. He actually walked to his appointment today as his home is less than half a mile away and reports feeling  well with this without any chest pain or dyspnea. He denies any recent orthopnea, PND or pitting edema. ? ?He reports good compliance with Coumadin for anticoagulation but did stop ASA as he did not have an Rx for this and we reviewed he should restart this. He denies any recent melena, hematochezia or hematuria. ? ? ?Past Medical History:  ?Diagnosis Date  ? Allergy   ? Aortic stenosis   ? a. s/p AVR using an On-X 23 mm mechanical valve on 04/04/2021  ? Enlarged prostate   ? Heart murmur   ? ? ?Past Surgical History:  ?Procedure Laterality Date  ? AMPUTATION FINGER Right   ? fingertip right long  ? AORTIC VALVE REPLACEMENT N/A 04/04/2021  ? Procedure: AORTIC VALVE REPLACEMENT (AVR) USING ON-X VALVE SIZE 23MM;  Surgeon: Isaac Nakayama, MD;  Location: [facility];  Service: Open Heart Surgery;  Laterality: N/A;  mechanical  ? RIGHT HEART CATH AND CORONARY ANGIOGRAPHY N/A 03/16/2021  ? Procedure: RIGHT HEART CATH AND CORONARY ANGIOGRAPHY;  Surgeon: Isaac Blanks, MD;  Location: [facility];  Service: Cardiovascular;  Laterality: N/A;  ? TEE WITHOUT CARDIOVERSION N/A 04/04/2021  ? Procedure: TRANSESOPHAGEAL ECHOCARDIOGRAM (TEE);  Surgeon: Isaac Nakayama, MD;  Location: [facility];  Service: Open Heart Surgery;  Laterality: N/A;  ? WISDOM TOOTH EXTRACTION    ? ? ?Current Medications: ?Outpatient Medications Prior to Visit  ?Medication Sig Dispense Refill  ? aspirin EC 81 MG EC tablet Take 1  tablet (81 mg total) by mouth daily. Swallow whole. 30 tablet 11  ? warfarin (COUMADIN) 5 MG tablet Take 1 tablet (5 mg total) by mouth daily. Or as directed 30 tablet 1  ? amiodarone (PACERONE) 200 MG tablet Take 2 tablets (400 mg total) by mouth 2 (two) times daily. For 2 days;then take 200 mg bid for 2 weeks;then take 200 mg daily thereafter 60 tablet 1  ? metoprolol succinate (TOPROL-XL) 25 MG 24 hr tablet Take 1 tablet (25 mg total) by mouth daily. 30 tablet 1  ? furosemide (LASIX) 40 MG tablet Take 1 tablet  (40 mg total) by mouth daily. For 4 days then stop. (Patient not taking: Reported on 05/01/2021) 4 tablet 0  ? guaiFENesin (MUCINEX) 600 MG 12 hr tablet Take 1 tablet (600 mg total) by mouth 2 (two) times daily as needed for cough or to loosen phlegm. (Patient not taking: Reported on 05/01/2021)    ? lidocaine (LIDODERM) 5 % Place 1 patch onto the skin daily. Remove & Discard patch within 12 hours or as directed by MD (Patient not taking: Reported on 05/01/2021) 6 patch 0  ? oxyCODONE 10 MG TABS Take 1 tablet (10 mg total) by mouth every 6 (six) hours as needed for severe pain. (Patient not taking: Reported on 05/01/2021) 30 tablet 0  ? potassium chloride SA (KLOR-CON M) 20 MEQ tablet Take 1 tablet (20 mEq total) by mouth daily. For 4 days then stop. 4 tablet 0  ? ?Facility-Administered Medications Prior to Visit  ?Medication Dose Route Frequency Provider Last Rate Last Admin  ? sodium chloride flush (NS) 0.9 % injection 3 mL  3 mL Intravenous Q12H Branch, Isaac Guild, MD      ?  ? ?Allergies:   Other and Chocolate  ? ?Social History  ? ?Socioeconomic History  ? Marital status: Legally Separated  ?  Spouse name: Not on file  ? Number of children: 6  ? Years of education: 73  ? Highest education level: Not on file  ?Occupational History  ? Occupation: concrete pre [employer]  ?Tobacco Use  ? Smoking status: Every Day  ?  Packs/day: 0.50  ?  Years: 28.00  ?  Pack years: 14.00  ?  Types: Cigarettes  ?  Start date: 02/12/1988  ? Smokeless tobacco: Never  ?Vaping Use  ? Vaping Use: Never used  ?Substance and Sexual Activity  ? Alcohol use: Yes  ?  Alcohol/week: 14.0 standard drinks  ?  Types: 14 Cans of beer per week  ?  Comment:  weekly  ? Drug use: Yes  ?  Types: Marijuana  ?  Comment: rare  ? Sexual activity: Not Currently  ?  Birth control/protection: None  ?Other Topics Concern  ? Not on file  ?Social History Narrative  ? Born in [place]  ? Worked in [place] for 17 years  ? Moved back Dec 2018  ? Lives with Isaac Diaz  ? Family nearby  ? ?Social Determinants of Health  ? ?Financial Resource Strain: Not on file  ?Food Insecurity: Not on file  ?Transportation Needs: Not on file  ?Physical Activity: Not on file  ?Stress: Not on file  ?Social Connections: Not on file  ?  ? ?Family History:  The patient's family history includes Aneurysm in his sister; Cancer in his maternal grandfather and mother; Stroke (age of onset: 58) in his father.  ? ?Review of Systems:   ? ?Please see the history of present illness.    ? ?  All other systems reviewed and are otherwise negative except as noted above. ? ? ?Physical Exam:   ? ?VS:  BP 100/62   Pulse (!) 57   Ht 6\' 2"  (1.88 m)   Wt 178 lb (80.7 kg)   SpO2 98%   BMI 22.85 kg/m?    ?General: Well developed, well nourished,male appearing in no acute distress. ?Head: Normocephalic, atraumatic. ?Neck: No carotid bruits. JVD not elevated.  ?Lungs: Respirations regular and unlabored, without wheezes or rales.  ?Heart: Regular rate and rhythm. Crisp mechanical valve sounds. Surgical incision appears to be healing well without erythema or drainage.  ?Abdomen: Appears non-distended. No obvious abdominal masses. ?Msk:  Strength and tone appear normal for age. No obvious joint deformities or effusions. ?Extremities: No clubbing or cyanosis. No pitting edema.  Distal pedal pulses are 2+ bilaterally. ?Neuro: Alert and oriented X 3. Moves all extremities spontaneously. No focal deficits noted. ?Psych:  Responds to questions appropriately with a normal affect. ?Skin: No rashes or lesions noted ? ?Wt Readings from Last 3 Encounters:  ?05/01/21 178 lb (80.7 kg)  ?04/11/21 165 lb 9.1 oz (75.1 kg)  ?04/03/21 181 lb 3.2 oz (82.2 kg)  ?  ? ?Studies/Labs Reviewed:  ? ?EKG:  EKG is ordered today. The ekg ordered today demonstrates sinus bradycardia, HR 57 with borderline LVH.  ? ?Recent Labs: ?04/03/2021: ALT 18 ?04/09/2021: Magnesium 2.0; TSH 2.871 ?04/11/2021: BUN 13; Creatinine, Ser 1.04; Hemoglobin 12.8;  Platelets 351; Potassium 3.9; Sodium 133  ? ?Lipid Panel ?   ?Component Value Date/Time  ? CHOL 147 06/21/2016 1046  ? TRIG 138 06/21/2016 1046  ? HDL 38 (L) 06/21/2016 1046  ? CHOLHDL 3.9 06/21/2016 1046  ? VLDL 28

## 2021-05-07 ENCOUNTER — Other Ambulatory Visit: Payer: Self-pay | Admitting: Thoracic Surgery (Cardiothoracic Vascular Surgery)

## 2021-05-07 DIAGNOSIS — Z952 Presence of prosthetic heart valve: Secondary | ICD-10-CM

## 2021-05-08 ENCOUNTER — Ambulatory Visit: Payer: Managed Care, Other (non HMO) | Admitting: Thoracic Surgery (Cardiothoracic Vascular Surgery)

## 2021-05-15 ENCOUNTER — Ambulatory Visit: Payer: Self-pay

## 2021-05-15 ENCOUNTER — Ambulatory Visit (INDEPENDENT_AMBULATORY_CARE_PROVIDER_SITE_OTHER): Payer: Managed Care, Other (non HMO) | Admitting: *Deleted

## 2021-05-15 DIAGNOSIS — Z952 Presence of prosthetic heart valve: Secondary | ICD-10-CM | POA: Diagnosis not present

## 2021-05-15 DIAGNOSIS — Z5181 Encounter for therapeutic drug level monitoring: Secondary | ICD-10-CM

## 2021-05-15 LAB — POCT INR: INR: 1.2 — AB (ref 2.0–3.0)

## 2021-05-15 NOTE — Patient Instructions (Signed)
Has been taking 2.5mg  daily instead of 5mg .  Was supposed to cut back Toprol but did warfarin instead ?Took warfarin this morning. ?INR 2.0 - 3.0 x 3 month then 1.5 - 2.0 for Onxy Aortic Valve ?Take warfarin 1 extra tablet today, take 2 tablets tomorrow then continue 1 tablet daily ?Recheck in 1 wk ?

## 2021-05-16 ENCOUNTER — Ambulatory Visit: Payer: Self-pay

## 2021-05-18 ENCOUNTER — Other Ambulatory Visit (HOSPITAL_COMMUNITY): Payer: Self-pay | Admitting: *Deleted

## 2021-05-18 ENCOUNTER — Ambulatory Visit (HOSPITAL_COMMUNITY)
Admission: RE | Admit: 2021-05-18 | Discharge: 2021-05-18 | Disposition: A | Discharge status: Home / Self Care | Payer: Managed Care, Other (non HMO) | Source: Ambulatory Visit | Attending: Cardiology | Admitting: Cardiology

## 2021-05-18 DIAGNOSIS — I350 Nonrheumatic aortic (valve) stenosis: Secondary | ICD-10-CM | POA: Insufficient documentation

## 2021-05-18 LAB — ECHOCARDIOGRAM COMPLETE
AR max vel: 1.85 cm2
AV Area VTI: 1.89 cm2
AV Area mean vel: 1.89 cm2
AV Mean grad: 16 mmHg
AV Peak grad: 30 mmHg
Ao pk vel: 2.74 m/s
Area-P 1/2: 2.87 cm2
S' Lateral: 3.4 cm

## 2021-05-18 NOTE — Progress Notes (Signed)
*  PRELIMINARY RESULTS* ?Echocardiogram ?2D Echocardiogram has been performed. ? ?Stacey Drain ?05/18/2021, 10:15 AM ?

## 2021-05-24 ENCOUNTER — Encounter: Payer: Self-pay | Admitting: *Deleted

## 2021-05-24 ENCOUNTER — Telehealth: Payer: Self-pay | Admitting: *Deleted

## 2021-05-24 NOTE — Telephone Encounter (Signed)
-----   Message from Antoine Poche, MD sent at 05/23/2021  3:49 PM EDT ----- ?Echo looks good. Heart pumping function is normal, his replaced valve is working well ? ? ?Dominga Ferry MD ?

## 2021-05-24 NOTE — Telephone Encounter (Signed)
Lesle Chris, LPN  ?03/15/5425  9:19 AM EDT   ?  ?Patient notified via letter.  Copy to pcp  ? ? ?Correction - NO PCP.  ?

## 2021-05-28 ENCOUNTER — Encounter: Payer: Self-pay | Admitting: Cardiology

## 2021-05-28 ENCOUNTER — Ambulatory Visit (INDEPENDENT_AMBULATORY_CARE_PROVIDER_SITE_OTHER): Payer: Managed Care, Other (non HMO) | Admitting: Cardiology

## 2021-05-28 ENCOUNTER — Ambulatory Visit (INDEPENDENT_AMBULATORY_CARE_PROVIDER_SITE_OTHER): Payer: Managed Care, Other (non HMO) | Admitting: *Deleted

## 2021-05-28 VITALS — BP 126/68 | HR 59 | Ht 74.0 in | Wt 180.0 lb

## 2021-05-28 DIAGNOSIS — Z952 Presence of prosthetic heart valve: Secondary | ICD-10-CM | POA: Diagnosis not present

## 2021-05-28 DIAGNOSIS — I9789 Other postprocedural complications and disorders of the circulatory system, not elsewhere classified: Secondary | ICD-10-CM | POA: Diagnosis not present

## 2021-05-28 DIAGNOSIS — Z5181 Encounter for therapeutic drug level monitoring: Secondary | ICD-10-CM

## 2021-05-28 DIAGNOSIS — I4891 Unspecified atrial fibrillation: Secondary | ICD-10-CM

## 2021-05-28 LAB — POCT INR: INR: 2 (ref 2.0–3.0)

## 2021-05-28 NOTE — Patient Instructions (Signed)
Has been taking 2.5mg  daily instead of 5mg .  Was supposed to cut back Toprol but did warfarin instead ?Took warfarin this morning. ?INR 2.0 - 3.0 x 3 month then 1.5 - 2.0 for Onxy Aortic Valve ?Will D/C Amiodarone 06/02/21 ?Continue warfarin 1 tablet daily ?Recheck in 2.5 wks ?

## 2021-05-28 NOTE — Addendum Note (Signed)
Addended by: Delsa Sale on: 05/28/2021 04:51 PM ? ? Modules accepted: Orders ? ?

## 2021-05-28 NOTE — Patient Instructions (Signed)
Medication Instructions:  ?Stop Amiodarone (Pacerone) ?Continue all other medications.    ? ?Labwork: ?none ? ?Testing/Procedures: ?none ? ?Follow-Up: ?6 months  ? ?Any Other Special Instructions Will Be Listed Below (If Applicable). ? ? ?If you need a refill on your cardiac medications before your next appointment, please call your pharmacy. ? ?

## 2021-05-28 NOTE — Progress Notes (Signed)
? ? ? ?Clinical Summary ?Mr. Chaussee is a 56 y.o.male seen today for follow up of the following medical problems ?  ?1. Aortic stenosis/Bicuspid AV ?- echo 06/2016 LVEF 123456, normal diastolic function, moderate AS mean grad 32, AVA VTI 1.46, mod AI, mild MR ?  ?  ?  ?2018 CTA no aortopathy  ?04/2019 echo LVEF 60-65%, bicuspid AV, mod AS AVA VTI 1.46 mean grad 33 DI 0.35.  ?  ? 11/2020 echo: LVEF  60-65%. AVA VTI 0.94 mean grad 43 DI 0.30 ?  ?- 04/05/2021 23 mm On-X mechanical valve (reference ONXANE, serial number [id number] placed ?05/2021 LVEF 60-65%, normal AVR ?-some generalized fatigue. No SOB/DOE ?- compliant with coumadin, no bleeding. He is on ASA 81.  ?  ?  ? ?  ?2.Postop afib ?- after AVR surgery, started on amiodarone and coumadin ?- discharged on oral amio, was lowered to 200mg  daily at 05/01/21 f/u ?- no palpitations ? ?  ?- SH: son with Tetrology of fallot with prior surgery ?Works  concrete/construction ? ? ?Past Medical History:  ?Diagnosis Date  ? Allergy   ? Aortic stenosis   ? a. s/p AVR using an On-X 23 mm mechanical valve on 04/04/2021  ? Enlarged prostate   ? Heart murmur   ? ? ? ?Allergies  ?Allergen Reactions  ? Other Anaphylaxis  ?  Feathers (pillow stuffing)  ? Chocolate Hives  ? ? ? ?Current Outpatient Medications  ?Medication Sig Dispense Refill  ? amiodarone (PACERONE) 200 MG tablet Take 200 mg daily for 30 days and then STOP 30 tablet 0  ? aspirin EC 81 MG EC tablet Take 1 tablet (81 mg total) by mouth daily. Swallow whole. 30 tablet 11  ? metoprolol succinate (TOPROL XL) 25 MG 24 hr tablet Take 0.5 tablets (12.5 mg total) by mouth daily. 45 tablet 3  ? warfarin (COUMADIN) 5 MG tablet Take 1 tablet (5 mg total) by mouth daily. Or as directed 30 tablet 1  ? ?Current Facility-Administered Medications  ?Medication Dose Route Frequency Provider Last Rate Last Admin  ? sodium chloride flush (NS) 0.9 % injection 3 mL  3 mL Intravenous Q12H Kimbella Heisler, Alphonse Guild, MD      ? ? ? ?Past Surgical History:   ?Procedure Laterality Date  ? AMPUTATION FINGER Right   ? fingertip right long  ? AORTIC VALVE REPLACEMENT N/A 04/04/2021  ? Procedure: AORTIC VALVE REPLACEMENT (AVR) USING ON-X VALVE SIZE 23MM;  Surgeon: Melrose Nakayama, MD;  Location: [facility];  Service: Open Heart Surgery;  Laterality: N/A;  mechanical  ? RIGHT HEART CATH AND CORONARY ANGIOGRAPHY N/A 03/16/2021  ? Procedure: RIGHT HEART CATH AND CORONARY ANGIOGRAPHY;  Surgeon: Burnell Blanks, MD;  Location: [facility];  Service: Cardiovascular;  Laterality: N/A;  ? TEE WITHOUT CARDIOVERSION N/A 04/04/2021  ? Procedure: TRANSESOPHAGEAL ECHOCARDIOGRAM (TEE);  Surgeon: Melrose Nakayama, MD;  Location: [facility];  Service: Open Heart Surgery;  Laterality: N/A;  ? WISDOM TOOTH EXTRACTION    ? ? ? ?Allergies  ?Allergen Reactions  ? Other Anaphylaxis  ?  Feathers (pillow stuffing)  ? Chocolate Hives  ? ? ? ? ?Family History  ?Problem Relation Age of Onset  ? Cancer Mother   ?     stomach  ? Stroke Father 61  ? Aneurysm Sister   ?     brain aneurysm  ? Cancer Maternal Grandfather   ?     lung  ? ? ? ?Social History ?  Mr. Salaiz reports that he has been smoking cigarettes. He started smoking about 33 years ago. He has a 14.00 pack-year smoking history. He has never used smokeless tobacco. ?Mr. Lesniak reports current alcohol use of about 14.0 standard drinks per week. ? ? ?Review of Systems ?CONSTITUTIONAL: + generalized fatigue ?HEENT: Eyes: No visual loss, blurred vision, double vision or yellow sclerae.No hearing loss, sneezing, congestion, runny nose or sore throat.  ?SKIN: No rash or itching.  ?CARDIOVASCULAR: per hpi ?RESPIRATORY: No shortness of breath, cough or sputum.  ?GASTROINTESTINAL: No anorexia, nausea, vomiting or diarrhea. No abdominal pain or blood.  ?GENITOURINARY: No burning on urination, no polyuria ?NEUROLOGICAL: No headache, dizziness, syncope, paralysis, ataxia, numbness or tingling in the extremities. No change in bowel or  bladder control.  ?MUSCULOSKELETAL: No muscle, back pain, joint pain or stiffness.  ?LYMPHATICS: No enlarged nodes. No history of splenectomy.  ?PSYCHIATRIC: No history of depression or anxiety.  ?ENDOCRINOLOGIC: No reports of sweating, cold or heat intolerance. No polyuria or polydipsia.  ?. ? ? ?Physical Examination ?Today's Vitals  ? 05/28/21 1537  ?BP: 126/68  ?Pulse: (!) 59  ?SpO2: 95%  ?Weight: 180 lb (81.6 kg)  ?Height: 6\' 2"  (1.88 m)  ? ?Body mass index is 23.11 kg/m?. ? ?Gen: resting comfortably, no acute distress ?HEENT: no scleral icterus, pupils equal round and reactive, no palptable cervical adenopathy,  ?CV: RRR, no m/r/g no jvd ?Resp: Clear to auscultation bilaterally ?GI: abdomen is soft, non-tender, non-distended, normal bowel sounds, no hepatosplenomegaly ?MSK: extremities are warm, no edema.  ?Skin: warm, no rash ?Neuro:  no focal deficits ?Psych: appropriate affect ? ? ?Diagnostic Studies ? ?11/2020 echo ? 1. AoV appears bicuspid with fusion of the RCC/NCC. There is severe  ?aortic stenosis. Vmax 4.4 m/s, MG 43 mmHG, AVA 0.94 cm2, DI 0.30. Due to  ?mild to moderate AI, DI is inaccurate and AVA is overestimated (LVOT VTI  ?31 cm). Findings are still consistent  ?with severe AS and mild to moderate AI. The aortic valve is bicuspid.  ?Aortic valve regurgitation is mild to moderate. Severe aortic valve  ?stenosis. Aortic valve area, by VTI measures 0.94 cm?Marland Kitchen Aortic valve mean  ?gradient measures 43.0 mmHg. Aortic valve  ?Vmax measures 4.45 m/s.  ? 2. Left ventricular ejection fraction, by estimation, is 60 to 65%. The  ?left ventricle has normal function. The left ventricle has no regional  ?wall motion abnormalities. There is mild concentric left ventricular  ?hypertrophy. Left ventricular diastolic  ?parameters were normal.  ? 3. Right ventricular systolic function is normal. The right ventricular  ?size is normal. Tricuspid regurgitation signal is inadequate for assessing  ?PA pressure.  ? 4. Left  atrial size was mildly dilated.  ? 5. The mitral valve is grossly normal. No evidence of mitral valve  ?regurgitation. No evidence of mitral stenosis.  ? 6. The inferior vena cava is normal in size with greater than 50%  ?respiratory variability, suggesting right atrial pressure of 3 mmHg.  ?  ? ?03/2021 RHC/LHC ?No angiographic evidence of CAD ?Normal right heart pressures ?Severe aortic stenosis by echo. I did not cross the aortic valve today.  ?  ?Recommendations: Continue planning for AVR. He is likely going to be best treated with surgical AVR. I will make a referral to CT surgery to discuss this.  ? ? ? ? ? ?Assessment and Plan  ? ?Severe aortic stenosis s/p mechanical AVR ?- doing well nearly 2 months out from surgery ?- no symptoms ?- repeat  echo shows normal functioning AVR ?- reminded him to reschedule his f/u with CT surgery ?- continue coumadin/ASA in setting of mechanical valve ?  ?2. Postop afib ?- remains in SR today by EKG ?- will d/c amiodarone ?- some genearlized fatigue, perhaps due to amio. If ongoing off then may need to stop toprol  ?  ? ? ? ? ?Arnoldo Lenis, M.D. ?

## 2021-06-04 NOTE — Progress Notes (Addendum)
? ?   ?[address] ?      [place] [address] ?            [phone]   ? ?   ?HPI: ? ?Patient returns for routine postoperative follow-up having undergone an aortic valve replacement (with On x mechanical valve, size 23 mm) on 04/04/2021 by Dr. Roxan Hockey. He had post op a fib but converted to sinus rhythm with Amiodarone. He did have an infiltrated IV on his right forearm but did not require antibiotic. He initially had an issue with constipation but this did resolve and he is not taking Oxy anymore. He states he walks multiple times per day. He recently felt a "shock" on left side but no other complaints (likely related to surgery, but if persistent he needs to seek medical attention).  ? ?Current Outpatient Medications  ?Medication Sig Dispense Refill  ? aspirin EC 81 MG EC tablet Take 1 tablet (81 mg total) by mouth daily. Swallow whole. 30 tablet 11  ? metoprolol succinate (TOPROL XL) 25 MG 24 hr tablet Take 0.5 tablets (12.5 mg total) by mouth daily. 45 tablet 3  ? warfarin (COUMADIN) 5 MG tablet Take 1 tablet (5 mg total) by mouth daily. Or as directed 30 tablet 1  ? ?Current Facility-Administered Medications  ?Medication Dose Route Frequency Provider Last Rate Last Admin  ? sodium chloride flush (NS) 0.9 % injection 3 mL  3 mL Intravenous Q12H Branch, Alphonse Guild, MD      ? ?Vital Signs:  ?Vitals:  ? 06/05/21 1427  ?BP: 137/88  ?Pulse: 60  ?Resp: 20  ?SpO2: 99%  ?  ?Physical Exam: ?CV-RRR, no murmur. Rhythm strip showed NSR ?Pulmonary-Clear to auscultation bilaterally ?Abdomen-Soft, non tender, bowel sounds present ?Extremities-No LE edema ?Wound-Clean, dry, well healed without sign of infection ? ?Diagnostic Tests: ?Narrative & Impression  ?CLINICAL DATA:  Status post atrial valve replacement 04/04/2021 ?  ?EXAM: ?CHEST - 2 VIEW ?  ?COMPARISON:  AP chest 04/08/2021 ?  ?FINDINGS: ?Status post median sternotomy. Cardiac silhouette and mediastinal ?contours are within normal limits with  calcification again seen ?within aortic arch. Cardiac valve prosthesis is again seen. The ?lungs are clear. Resolution of the prior bibasilar airspace ?opacities. Resolution of the prior pleural effusions. No ?pneumothorax. Minimal multilevel degenerative disc changes of the ?thoracic spine. ?  ?IMPRESSION: ?No active cardiopulmonary disease. ?  ?  ?Electronically Signed ?  By: Yvonne Kendall M.D. ?  On: 06/05/2021 14:28  ? ? ?Impression and Plan: ?Patient is recovering well from aortic valve replacement. He has already seen Dr. Harl Bowie 05/28/2021. He was in sinus rhythm so Amiodarone was stopped. His last INR was 04/17 and it was 2. Cardiology will follow his INR and adjust Coumadin accordingly. He does have an Ox X aortic valve so INR can be about 2-2.5. He was informed his chest x ray shows no acute abnormality. Patient was instructed he may lift more than 10 pounds as he has completed 8 weeks of sternal precautions. He was instructed to gradually increase weight (I.e. 10 to 20 pounds not 10 to 50 pounds). He is not taking any narcotic for pain, so he may begin driving short distances (I.e. 30 minutes or less during the day) and may gradually increase the frequency and duration as tolerates. We encourage the  ?patient to participate in cardiac rehab;he wish to do so and is scheduled to start on next week. He did inquire when he may return to work. 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 He may try to return in 2-4 weeks, depending on how he feels. He was seen by Dr. Roxan Hockey at this visit as well. He will continue to be followed by cardiology and will be seen by [place] [facility]. ? ? ? ?Nani Skillern, PA-C ?[facility] ?[phone] ? ? ? ? ?

## 2021-06-05 ENCOUNTER — Other Ambulatory Visit: Payer: Self-pay | Admitting: Physician Assistant

## 2021-06-05 ENCOUNTER — Ambulatory Visit (INDEPENDENT_AMBULATORY_CARE_PROVIDER_SITE_OTHER): Payer: Self-pay | Admitting: Physician Assistant

## 2021-06-05 ENCOUNTER — Ambulatory Visit
Admission: RE | Admit: 2021-06-05 | Discharge: 2021-06-05 | Disposition: A | Discharge status: Home / Self Care | Payer: Managed Care, Other (non HMO) | Source: Ambulatory Visit | Attending: Thoracic Surgery (Cardiothoracic Vascular Surgery) | Admitting: Thoracic Surgery (Cardiothoracic Vascular Surgery)

## 2021-06-05 ENCOUNTER — Other Ambulatory Visit: Payer: Self-pay | Admitting: Student

## 2021-06-05 ENCOUNTER — Encounter: Payer: Self-pay | Admitting: Physician Assistant

## 2021-06-05 VITALS — BP 137/88 | HR 60 | Resp 20 | Ht 74.0 in | Wt 186.8 lb

## 2021-06-05 DIAGNOSIS — Z952 Presence of prosthetic heart valve: Secondary | ICD-10-CM

## 2021-06-05 NOTE — Patient Instructions (Signed)
You may return to driving an automobile as long as you are no longer requiring oral narcotic pain relievers during the daytime.  It would be wise to start driving only short distances during the daylight and gradually increase from there as you feel comfortable.  ? ?You are encouraged to enroll and participate in the outpatient cardiac rehab program beginning as soon as practical.  ? ?You may continue to gradually increase your physical activity as tolerated.  Refrain from any heavy lifting or strenuous use of your arms and shoulders until at least 8 weeks from the time of your surgery, and avoid activities that cause increased pain in your chest on the side of your surgical incision.  Otherwise, you may continue to increase activities without any particular limitations.  Increase the intensity and duration of physical activity gradually.  ?

## 2021-06-12 ENCOUNTER — Ambulatory Visit (INDEPENDENT_AMBULATORY_CARE_PROVIDER_SITE_OTHER): Payer: Managed Care, Other (non HMO) | Admitting: *Deleted

## 2021-06-12 DIAGNOSIS — Z952 Presence of prosthetic heart valve: Secondary | ICD-10-CM | POA: Diagnosis not present

## 2021-06-12 DIAGNOSIS — Z5181 Encounter for therapeutic drug level monitoring: Secondary | ICD-10-CM | POA: Diagnosis not present

## 2021-06-12 LAB — POCT INR: INR: 1.5 — AB (ref 2.0–3.0)

## 2021-06-12 MED ORDER — WARFARIN SODIUM 5 MG PO TABS
ORAL_TABLET | ORAL | 3 refills | Status: DC
Start: 2021-06-12 — End: 2021-10-24

## 2021-06-12 NOTE — Patient Instructions (Signed)
Amiodarone discontinued by Dr Wyline Mood 05/28/21 ?Took warfarin this morning. ?INR 2.0 - 3.0 x 3 month then 1.5 - 2.0 for Onxy Aortic Valve (End of May) ?Will D/C Amiodarone 06/02/21 ?Take another warfarin 1 tablet today then resume 1 tablet daily ?Recheck in 2 wks ?

## 2021-06-14 ENCOUNTER — Encounter (HOSPITAL_COMMUNITY): Admission: RE | Admit: 2021-06-14 | Payer: Managed Care, Other (non HMO) | Source: Ambulatory Visit

## 2021-06-27 ENCOUNTER — Ambulatory Visit (INDEPENDENT_AMBULATORY_CARE_PROVIDER_SITE_OTHER): Payer: Managed Care, Other (non HMO) | Admitting: *Deleted

## 2021-06-27 DIAGNOSIS — Z5181 Encounter for therapeutic drug level monitoring: Secondary | ICD-10-CM | POA: Diagnosis not present

## 2021-06-27 DIAGNOSIS — Z952 Presence of prosthetic heart valve: Secondary | ICD-10-CM | POA: Diagnosis not present

## 2021-06-27 LAB — POCT INR: INR: 1.3 — AB (ref 2.0–3.0)

## 2021-06-27 NOTE — Patient Instructions (Signed)
Amiodarone discontinued by Dr Wyline Mood 05/28/21 ?Took warfarin this morning. ?INR 2.0 - 3.0 x 3 month then 1.5 - 2.0 for Onxy Aortic Valve (End of May) ?Take another warfarin 1 tablet today, take 2 tablets tomorrow then increase dose to 1 1/2 tablets daily except 1 tablet on Mondays, Wednesdays and Fridays ?Recheck in 2 wks ?

## 2021-07-11 ENCOUNTER — Ambulatory Visit (INDEPENDENT_AMBULATORY_CARE_PROVIDER_SITE_OTHER): Payer: Managed Care, Other (non HMO) | Admitting: *Deleted

## 2021-07-11 DIAGNOSIS — Z952 Presence of prosthetic heart valve: Secondary | ICD-10-CM

## 2021-07-11 DIAGNOSIS — Z5181 Encounter for therapeutic drug level monitoring: Secondary | ICD-10-CM | POA: Diagnosis not present

## 2021-07-11 LAB — POCT INR: INR: 2.3 (ref 2.0–3.0)

## 2021-07-11 NOTE — Patient Instructions (Signed)
Amiodarone discontinued by Dr Harl Bowie 05/28/21 Took warfarin this morning. Decrease warfarin to 1 tablet daily except 1 1/2 tablets on Sundays and Wednesdays Recheck in 2 wks

## 2021-07-30 ENCOUNTER — Telehealth: Payer: Self-pay | Admitting: Cardiology

## 2021-07-30 NOTE — Telephone Encounter (Signed)
Pt is wanting to know if he can schedule an appt for this week after having to cancel the one from last week. I let him know that he does have appt schedule for 06/19, but pt would like a call back to clarify that this is okay or if he needs a sooner appt.

## 2021-07-30 NOTE — Telephone Encounter (Signed)
Returned call to the pt and advised that the current appt he has on 08/09/2021 at 230pm is okay as the [facility] office is closed in [place] this week. He verbalized understanding and will call if needed.

## 2021-07-30 NOTE — Telephone Encounter (Signed)
Initially had anticoag visit scheduled for 07/25/21 - was cancelled by patient.  Next visit is scheduled for 08/09/21.

## 2021-08-09 ENCOUNTER — Ambulatory Visit (INDEPENDENT_AMBULATORY_CARE_PROVIDER_SITE_OTHER): Payer: Managed Care, Other (non HMO) | Admitting: *Deleted

## 2021-08-09 ENCOUNTER — Telehealth: Payer: Self-pay | Admitting: Cardiology

## 2021-08-09 DIAGNOSIS — Z952 Presence of prosthetic heart valve: Secondary | ICD-10-CM | POA: Diagnosis not present

## 2021-08-09 DIAGNOSIS — Z5181 Encounter for therapeutic drug level monitoring: Secondary | ICD-10-CM

## 2021-08-09 LAB — POCT INR: INR: 1.4 — AB (ref 2.0–3.0)

## 2021-08-09 NOTE — Patient Instructions (Signed)
Amiodarone discontinued by Dr Wyline Mood 05/28/21 Took warfarin this morning. Increase warfarin to 1 tablet daily except 1 1/2 tablets on Sundays, Tuesdays and Thursdays Recheck in 2 wks

## 2021-08-09 NOTE — Telephone Encounter (Signed)
Patient called and said that he needs a letter from Dr. Wyline Mood stating that he can start back to work

## 2021-08-13 ENCOUNTER — Encounter: Payer: Self-pay | Admitting: *Deleted

## 2021-08-13 NOTE — Telephone Encounter (Signed)
Ok to provide return to work letter without restrictions  Dominga Ferry MD

## 2021-08-13 NOTE — Telephone Encounter (Signed)
Left message to return call 

## 2021-08-13 NOTE — Telephone Encounter (Signed)
Patient notified and verbalized understanding.  Will leave letter up front for patient pick up.

## 2021-08-13 NOTE — Telephone Encounter (Signed)
Pt is returning call.  

## 2021-08-17 NOTE — Telephone Encounter (Signed)
Niece Yancey Flemings picked up 08/17/21

## 2021-09-10 ENCOUNTER — Ambulatory Visit (INDEPENDENT_AMBULATORY_CARE_PROVIDER_SITE_OTHER): Payer: Managed Care, Other (non HMO) | Admitting: *Deleted

## 2021-09-10 DIAGNOSIS — Z952 Presence of prosthetic heart valve: Secondary | ICD-10-CM | POA: Diagnosis not present

## 2021-09-10 DIAGNOSIS — Z5181 Encounter for therapeutic drug level monitoring: Secondary | ICD-10-CM

## 2021-09-10 LAB — POCT INR: INR: 1.5 — AB (ref 2.0–3.0)

## 2021-09-10 NOTE — Patient Instructions (Signed)
Amiodarone discontinued by Dr Wyline Mood 05/28/21 Took warfarin this morning. Increase warfarin to 1 1/2 tablets daily except 1 tablet on Mondays, Wednesdays and Fridays Recheck in 3 wks

## 2021-10-01 ENCOUNTER — Ambulatory Visit (INDEPENDENT_AMBULATORY_CARE_PROVIDER_SITE_OTHER): Payer: Managed Care, Other (non HMO) | Admitting: *Deleted

## 2021-10-01 DIAGNOSIS — Z952 Presence of prosthetic heart valve: Secondary | ICD-10-CM | POA: Diagnosis not present

## 2021-10-01 DIAGNOSIS — Z5181 Encounter for therapeutic drug level monitoring: Secondary | ICD-10-CM | POA: Diagnosis not present

## 2021-10-01 LAB — POCT INR: INR: 1.3 — AB (ref 2.0–3.0)

## 2021-10-01 NOTE — Patient Instructions (Signed)
Amiodarone discontinued by Dr Wyline Mood 05/28/21 Took warfarin this morning. Increase warfarin to 1 1/2 tablets daily except 1 tablet on Fridays Recheck in 2 wks

## 2021-10-24 ENCOUNTER — Other Ambulatory Visit: Payer: Self-pay | Admitting: Cardiology

## 2021-10-24 NOTE — Telephone Encounter (Signed)
Refill request for warfarin:  Last INR was 1.3 on 10/01/21 Next INR was due 10/17/21 LOV was 05/28/21  Dominga Ferry MD  Message sent to schedulers to reschedule INR appt.  Refill approved x 1.

## 2021-10-25 ENCOUNTER — Ambulatory Visit: Payer: Managed Care, Other (non HMO) | Attending: Cardiology | Admitting: *Deleted

## 2021-10-25 DIAGNOSIS — Z952 Presence of prosthetic heart valve: Secondary | ICD-10-CM | POA: Diagnosis not present

## 2021-10-25 DIAGNOSIS — Z5181 Encounter for therapeutic drug level monitoring: Secondary | ICD-10-CM

## 2021-10-25 LAB — POCT INR: INR: 1.6 — AB (ref 2.0–3.0)

## 2021-10-25 NOTE — Patient Instructions (Signed)
Amiodarone discontinued by Dr Wyline Mood 05/28/21 Took warfarin this morning. Continue warfarin 1 1/2 tablets daily except 1 tablet on Saturdays Recheck in 3 wks

## 2021-11-15 ENCOUNTER — Ambulatory Visit: Payer: Managed Care, Other (non HMO) | Attending: Cardiology | Admitting: *Deleted

## 2021-11-15 DIAGNOSIS — Z952 Presence of prosthetic heart valve: Secondary | ICD-10-CM | POA: Diagnosis not present

## 2021-11-15 DIAGNOSIS — Z5181 Encounter for therapeutic drug level monitoring: Secondary | ICD-10-CM | POA: Diagnosis not present

## 2021-11-15 LAB — POCT INR: INR: 1.4 — AB (ref 2.0–3.0)

## 2021-11-15 NOTE — Patient Instructions (Signed)
Amiodarone discontinued by Dr Harl Bowie 05/28/21 Took warfarin this morning. Take warfarin extra 1/2 tablet today then resume 1 1/2 tablets daily except 1 tablet on Saturdays Recheck in 3 wks

## 2021-11-23 ENCOUNTER — Other Ambulatory Visit: Payer: Self-pay | Admitting: Cardiology

## 2021-11-23 DIAGNOSIS — Z952 Presence of prosthetic heart valve: Secondary | ICD-10-CM

## 2021-11-23 NOTE — Telephone Encounter (Signed)
Last INR 11/15/21 Last OV 05/28/21

## 2021-11-28 ENCOUNTER — Ambulatory Visit: Payer: Managed Care, Other (non HMO) | Attending: Cardiology | Admitting: Cardiology

## 2021-11-28 VITALS — BP 126/80 | HR 74 | Ht 74.0 in | Wt 183.0 lb

## 2021-11-28 DIAGNOSIS — I4891 Unspecified atrial fibrillation: Secondary | ICD-10-CM | POA: Diagnosis not present

## 2021-11-28 DIAGNOSIS — I9789 Other postprocedural complications and disorders of the circulatory system, not elsewhere classified: Secondary | ICD-10-CM

## 2021-11-28 DIAGNOSIS — Z79899 Other long term (current) drug therapy: Secondary | ICD-10-CM

## 2021-11-28 DIAGNOSIS — Z952 Presence of prosthetic heart valve: Secondary | ICD-10-CM | POA: Diagnosis not present

## 2021-11-28 NOTE — Addendum Note (Signed)
Addended by: Sung Amabile on: 11/28/2021 03:38 PM   Modules accepted: Orders

## 2021-11-28 NOTE — Progress Notes (Signed)
Clinical Summary Isaac Diaz is a 56 y.o.male seen today for follow up of the following medical problems   1. Aortic stenosis/Bicuspid AV - echo 06/2016 LVEF 60-65%, normal diastolic function, moderate AS mean grad 32, AVA VTI 1.46, mod AI, mild MR       2018 CTA no aortopathy  04/2019 echo LVEF 60-65%, bicuspid AV, mod AS AVA VTI 1.46 mean grad 33 DI 0.35.     11/2020 echo: LVEF  60-65%. AVA VTI 0.94 mean grad 43 DI 0.30   - 04/05/2021 23 mm On-X mechanical valve (reference ONXANE, serial number [id number] placed   05/2021 LVEF 60-65%, normal AVR -no SOB/DOE. Back to normal self - compliant with meds, no bleeding on coumadin and ASA.          2.Postop afib - after AVR surgery, started on amiodarone and coumadin - discharged on oral amio, was lowered to 200mg  daily at 05/01/21 f/u  - no recent palpitations.      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 s/p AVR using an On-X 23 mm mechanical valve on 04/04/2021   Enlarged prostate    Heart murmur      Allergies  Allergen Reactions   Other Anaphylaxis    Feathers (pillow stuffing)   Chocolate Hives     Current Outpatient Medications  Medication Sig Dispense Refill   aspirin EC 81 MG EC tablet Take 1 tablet (81 mg total) by mouth daily. Swallow whole. 30 tablet 11   metoprolol succinate (TOPROL XL) 25 MG 24 hr tablet Take 0.5 tablets (12.5 mg total) by mouth daily. (Patient taking differently: Take 25 mg by mouth daily.) 45 tablet 3   warfarin (COUMADIN) 5 MG tablet TAKE 1 TO 1 AND 1/2 TABLETS BY MOUTH DAILY AS DIRECTED BY COUMADIN CLINIC 45 tablet 0   Current Facility-Administered Medications  Medication Dose Route Frequency Provider Last Rate Last Admin   sodium chloride flush (NS) 0.9 % injection 3 mL  3 mL Intravenous Q12H 04/06/2021, MD         Past Surgical History:   Procedure Laterality Date   AMPUTATION FINGER Right    fingertip right long   AORTIC VALVE REPLACEMENT N/A 04/04/2021   Procedure: AORTIC VALVE REPLACEMENT (AVR) USING ON-X VALVE SIZE 04/06/2021;  Surgeon: , MD;  Location[facility];  Service: Open Heart Surgery;  Laterality: N/A;  mechanical   RIGHT HEART CATH AND CORONARY ANGIOGRAPHY N/A 03/16/2021   Procedure: RIGHT HEART CATH AND CORONARY ANGIOGRAPHY;  Surgeon: 05/14/2021, MD;  Location: [facility];  Service: Cardiovascular;  Laterality: N/A;   TEE WITHOUT CARDIOVERSION N/A 04/04/2021   Procedure: TRANSESOPHAGEAL ECHOCARDIOGRAM (TEE);  Surgeon: 04/06/2021, MD;  Location: [facility] OR;  Service: Open Heart Surgery;  Laterality: N/A;   WISDOM TOOTH EXTRACTION       Allergies  Allergen Reactions   Other Anaphylaxis    Feathers (pillow stuffing)   Chocolate Hives      Family History  Problem Relation Age of Onset   Cancer Mother        stomach   Stroke Father 47   Aneurysm Sister        brain aneurysm   Cancer Maternal Grandfather        lung     Social History  Mr. Haliburton reports that he has been smoking cigarettes. He started smoking about 33 years ago. He has a 14.00 pack-year smoking history. He has never used smokeless tobacco. Mr. Winterton reports current alcohol use of about 14.0 standard drinks of alcohol per week.   Review of Systems CONSTITUTIONAL: No weight loss, fever, chills, weakness or fatigue.  HEENT: Eyes: No visual loss, blurred vision, double vision or yellow sclerae.No hearing loss, sneezing, congestion, runny nose or sore throat.  SKIN: No rash or itching.  CARDIOVASCULAR: per hpi RESPIRATORY: No shortness of breath, cough or sputum.  GASTROINTESTINAL: No anorexia, nausea, vomiting or diarrhea. No abdominal pain or blood.  GENITOURINARY: No burning on urination, no polyuria NEUROLOGICAL: No headache, dizziness, syncope, paralysis, ataxia, numbness or tingling in  the extremities. No change in bowel or bladder control.  MUSCULOSKELETAL: No muscle, back pain, joint pain or stiffness.  LYMPHATICS: No enlarged nodes. No history of splenectomy.  PSYCHIATRIC: No history of depression or anxiety.  ENDOCRINOLOGIC: No reports of sweating, cold or heat intolerance. No polyuria or polydipsia.  Marland Kitchen   Physical Examination Today's Vitals   11/28/21 1453  BP: 126/80  Pulse: 74  SpO2: 99%  Weight: 183 lb (83 kg)  Height: 6\' 2"  (1.88 m)   Body mass index is 23.5 kg/m.  Gen: resting comfortably, no acute distress HEENT: no scleral icterus, pupils equal round and reactive, no palptable cervical adenopathy,  CV: RRR, mechanical S2,  Resp: Clear to auscultation bilaterally GI: abdomen is soft, non-tender, non-distended, normal bowel sounds, no hepatosplenomegaly MSK: extremities are warm, no edema.  Skin: warm, no rash Neuro:  no focal deficits Psych: appropriate affect   Diagnostic Studies 11/2020 echo  1. AoV appears bicuspid with fusion of the RCC/NCC. There is severe  aortic stenosis. Vmax 4.4 m/s, MG 43 mmHG, AVA 0.94 cm2, DI 0.30. Due to  mild to moderate AI, DI is inaccurate and AVA is overestimated (LVOT VTI  31 cm). Findings are still consistent  with severe AS and mild to moderate AI. The aortic valve is bicuspid.  Aortic valve regurgitation is mild to moderate. Severe aortic valve  stenosis. Aortic valve area, by VTI measures 0.94 cm. Aortic valve mean  gradient measures 43.0 mmHg. Aortic valve  Vmax measures 4.45 m/s.   2. Left ventricular ejection fraction, by estimation, is 60 to 65%. The  left ventricle has normal function. The left ventricle has no regional  wall motion abnormalities. There is mild concentric left ventricular  hypertrophy. Left ventricular diastolic  parameters were normal.   3. Right ventricular systolic function is normal. The right ventricular  size is normal. Tricuspid regurgitation signal is inadequate for  assessing  PA pressure.   4. Left atrial size was mildly dilated.   5. The mitral valve is grossly normal. No evidence of mitral valve  regurgitation. No evidence of mitral stenosis.   6. The inferior vena cava is normal in size with greater than 50%  respiratory variability, suggesting right atrial pressure of 3 mmHg.      03/2021 RHC/LHC No angiographic evidence of CAD Normal right heart pressures Severe aortic stenosis by echo. I did not cross the aortic valve today.    Recommendations: Continue planning for AVR. He is likely going to be best treated with surgical AVR. I will make a referral to CT surgery to discuss this.     Assessment and Plan  Severe aortic stenosis s/p mechanical AVR -continues to do well, back to his prior baseline physically -  continue asa/coumadin in setting of mecahnical valve    2. Postop afib - amio stopped last visit, no symptoms and EKG today shows NSR - no evidence of recurrence     F/u 1 year. Does not have pcp, obtain annual labs. Given list of local pcp's   Arnoldo Lenis, M.D.

## 2021-11-28 NOTE — Patient Instructions (Signed)
Medication Instructions:  Your physician recommends that you continue on your current medications as directed. Please refer to the Current Medication list given to you today.   Labwork: CMET, Mag, CBC, TSH, AgbA1C, Lipid (2 weeks Forestine Na)  Testing/Procedures: none  Follow-Up:  Your physician recommends that you schedule a follow-up appointment in: 1 year You will receive a call in about 10 months reminding you to schedule your appointment. If you do not receive this call, please contact our office.   Any Other Special Instructions Will Be Listed Below (If Applicable).  If you need a refill on your cardiac medications before your next appointment, please call your pharmacy.

## 2021-12-13 ENCOUNTER — Other Ambulatory Visit: Payer: Managed Care, Other (non HMO) | Admitting: *Deleted

## 2021-12-13 ENCOUNTER — Other Ambulatory Visit (HOSPITAL_COMMUNITY)
Admission: RE | Admit: 2021-12-13 | Discharge: 2021-12-13 | Disposition: A | Discharge status: Home / Self Care | Payer: Managed Care, Other (non HMO) | Source: Ambulatory Visit | Attending: Cardiology | Admitting: Cardiology

## 2021-12-13 DIAGNOSIS — Z952 Presence of prosthetic heart valve: Secondary | ICD-10-CM | POA: Insufficient documentation

## 2021-12-13 DIAGNOSIS — Z5181 Encounter for therapeutic drug level monitoring: Secondary | ICD-10-CM | POA: Diagnosis not present

## 2021-12-13 LAB — HEMOGLOBIN A1C
Hgb A1c MFr Bld: 5.6 % (ref 4.8–5.6)
Mean Plasma Glucose: 114.02 mg/dL

## 2021-12-13 LAB — LIPID PANEL
Cholesterol: 175 mg/dL (ref 0–200)
HDL: 48 mg/dL (ref >40)
LDL Cholesterol: 113 mg/dL — ABNORMAL HIGH (ref 0–99)
Total CHOL/HDL Ratio: 3.6 RATIO
Triglycerides: 72 mg/dL (ref <150)
VLDL: 14 mg/dL (ref 0–40)

## 2021-12-13 LAB — COMPREHENSIVE METABOLIC PANEL
ALT: 16 U/L (ref 0–44)
AST: 22 U/L (ref 15–41)
Albumin: 4 g/dL (ref 3.5–5.0)
Alkaline Phosphatase: 62 U/L (ref 38–126)
Anion gap: 9 (ref 5–15)
BUN: 9 mg/dL (ref 6–20)
CO2: 21 mmol/L — ABNORMAL LOW (ref 22–32)
Calcium: 9.3 mg/dL (ref 8.9–10.3)
Chloride: 107 mmol/L (ref 98–111)
Creatinine, Ser: 0.76 mg/dL (ref 0.61–1.24)
GFR, Estimated: >60 mL/min (ref >60)
Glucose, Bld: 82 mg/dL (ref 70–99)
Potassium: 3.8 mmol/L (ref 3.5–5.1)
Sodium: 137 mmol/L (ref 135–145)
Total Bilirubin: 1.8 mg/dL — ABNORMAL HIGH (ref 0.3–1.2)
Total Protein: 7.5 g/dL (ref 6.5–8.1)

## 2021-12-13 LAB — CBC
HCT: 43.6 % (ref 39.0–52.0)
Hemoglobin: 15.1 g/dL (ref 13.0–17.0)
MCH: 33 pg (ref 26.0–34.0)
MCHC: 34.6 g/dL (ref 30.0–36.0)
MCV: 95.4 fL (ref 80.0–100.0)
Platelets: 184 10*3/uL (ref 150–400)
RBC: 4.57 MIL/uL (ref 4.22–5.81)
RDW: 12.6 % (ref 11.5–15.5)
WBC: 7.2 10*3/uL (ref 4.0–10.5)
nRBC: 0 % (ref 0.0–0.2)

## 2021-12-13 LAB — TSH: TSH: 0.676 u[IU]/mL (ref 0.350–4.500)

## 2021-12-13 LAB — MAGNESIUM: Magnesium: 2.2 mg/dL (ref 1.7–2.4)

## 2021-12-13 LAB — POCT INR: INR: 1.3 — AB (ref 2.0–3.0)

## 2021-12-13 NOTE — Patient Instructions (Signed)
Amiodarone discontinued by Dr Harl Bowie 05/28/21 Took warfarin this morning. Take warfarin extra 1/2 tablet today then increase dose to 1 1/2 tablets daily except 2 tablets on Thursdays. Recheck in 2 wks

## 2021-12-23 ENCOUNTER — Other Ambulatory Visit: Payer: Self-pay | Admitting: Cardiology

## 2021-12-23 DIAGNOSIS — Z952 Presence of prosthetic heart valve: Secondary | ICD-10-CM

## 2021-12-24 NOTE — Telephone Encounter (Signed)
Refill request for warfarin:  Last INR was 1.3 on 12/13/21 Next INR due 12/27/21 LOV was 11/28/21  J Branch  Refill approved.

## 2021-12-27 ENCOUNTER — Ambulatory Visit: Payer: Managed Care, Other (non HMO) | Attending: Cardiology | Admitting: *Deleted

## 2021-12-27 DIAGNOSIS — Z5181 Encounter for therapeutic drug level monitoring: Secondary | ICD-10-CM

## 2021-12-27 DIAGNOSIS — Z952 Presence of prosthetic heart valve: Secondary | ICD-10-CM

## 2021-12-27 LAB — POCT INR: INR: 3.4 — AB (ref 2.0–3.0)

## 2021-12-27 NOTE — Patient Instructions (Signed)
Amiodarone discontinued by Dr Wyline Mood 05/28/21 Took warfarin this morning. Hold warfarin tomorrow then decrease dose to 1 1/2 tablets daily except 1 tablet on Sundays Recheck in 2 wks

## 2022-01-02 ENCOUNTER — Encounter: Payer: Self-pay | Admitting: Family Medicine

## 2022-01-02 ENCOUNTER — Ambulatory Visit (INDEPENDENT_AMBULATORY_CARE_PROVIDER_SITE_OTHER): Payer: Managed Care, Other (non HMO) | Admitting: Family Medicine

## 2022-01-02 VITALS — BP 142/82 | HR 85 | Ht 74.0 in | Wt 180.0 lb

## 2022-01-02 DIAGNOSIS — R21 Rash and other nonspecific skin eruption: Secondary | ICD-10-CM | POA: Diagnosis not present

## 2022-01-02 DIAGNOSIS — R030 Elevated blood-pressure reading, without diagnosis of hypertension: Secondary | ICD-10-CM | POA: Diagnosis not present

## 2022-01-02 DIAGNOSIS — Z1211 Encounter for screening for malignant neoplasm of colon: Secondary | ICD-10-CM

## 2022-01-02 MED ORDER — TRIAMCINOLONE ACETONIDE 0.1 % EX CREA: 1.0000 | TOPICAL_CREAM | Freq: Two times a day (BID) | CUTANEOUS | 0 refills | Status: DC

## 2022-01-02 NOTE — Assessment & Plan Note (Signed)
BP is elevated today The patient notes that he has never been diagnosed with hypertension He denies headaches, dizziness, blurred vision, chest pain, palpitation Will reevaluate blood pressure in 2 weeks BP Readings from Last 3 Encounters:  01/02/22 (!) 142/82  11/28/21 126/80  06/05/21 137/88

## 2022-01-02 NOTE — Progress Notes (Signed)
New Patient Office Visit  Subjective:  Patient ID: Isaac Diaz, male    DOB: May 08, 1965  Age: 56 y.o. MRN: [id number]  CC:  Chief Complaint  Patient presents with   Establish Care    New patient establishing care today.    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     HPI Isaac Diaz is a 56 y.o. male with past medical history of aortic atherosclerosis, s/p aortic valve replacement presents for establishing care.  Skin rash: Onset of symptoms a year ago. He works at [employer] and thinks the rash on his body is due to concrete poisoning.  He complains of a dry pruritic rash on his arms, neck, lower abdomen, and knees with a burning sensation.  He reports using topical cortisone 10 cream, Neosporin, baby oil, Vaseline, sunscreen, and cocoa butter with minimal relief of his symptoms.  He reports that applying hot water to the affected site relieves the itching.  He notes that the rash subsided when he was taken out of work for a month for his open heart surgery in February 2023.  He reports worsening of the rash once he returns to work.    Past Medical History:  Diagnosis Date   Allergy    Aortic stenosis    a. s/p AVR using an On-X 23 mm mechanical valve on 04/04/2021   Enlarged prostate    Heart murmur     Past Surgical History:  Procedure Laterality Date   AMPUTATION FINGER Right    fingertip right long   AORTIC VALVE REPLACEMENT N/A 04/04/2021   Procedure: AORTIC VALVE REPLACEMENT (AVR) USING ON-X VALVE SIZE ;  Surgeon: Loreli Slot, MD;  Location: [facility] OR;  Service: Open Heart Surgery;  Laterality: N/A;  mechanical   RIGHT HEART CATH AND CORONARY ANGIOGRAPHY N/A 03/16/2021   Procedure: RIGHT HEART CATH AND CORONARY ANGIOGRAPHY;  Surgeon: Kathleene Hazel, MD;  Location: [facility];  Service: Cardiovascular;  Laterality: N/A;    TEE WITHOUT CARDIOVERSION N/A 04/04/2021   Procedure: TRANSESOPHAGEAL ECHOCARDIOGRAM (TEE);  Surgeon: Loreli Slot, MD;  Location: [facility] OR;  Service: Open Heart Surgery;  Laterality: N/A;   WISDOM TOOTH EXTRACTION      Family History  Problem Relation Age of Onset   Cancer Mother        stomach   Stroke Father 6   Aneurysm Sister        brain aneurysm   Cancer Maternal Grandfather        lung    Social History   Socioeconomic History   Marital status: Legally Separated    Spouse name: Not on file   Number of children: 6   Years of education: 12   Highest education level: Not on file  Occupational History   Occupation: concrete pre cast  Tobacco Use   Smoking status: Every Day    Packs/day: 0.50    Years: 28.00    Total pack years: 14.00    Types: Cigarettes    Start date: 02/12/1988   Smokeless tobacco: Never  Vaping Use   Vaping Use: Never used  Substance and Sexual Activity   Alcohol use: Yes    Alcohol/week: 14.0 standard drinks of alcohol    Types: 14 Cans of beer per week    Comment:  weekly   Drug use: Yes  Types: Marijuana    Comment: rare   Sexual activity: Not Currently    Birth control/protection: None  Other Topics Concern   Not on file  Social History Narrative   Born in [place]   Worked in [place] for 17 years   Moved back Dec 2018   Lives with Sister, [employer]   Family nearby   Social Determinants of Health   Financial Resource Strain: Not on file  Food Insecurity: Not on file  Transportation Needs: Not on file  Physical Activity: Not on file  Stress: Not on file  Social Connections: Not on file  Intimate Partner Violence: Not on file    ROS Review of Systems  Constitutional:  Negative for fatigue and fever.  Eyes:  Negative for visual disturbance.  Respiratory:  Negative for chest tightness and shortness of breath.   Cardiovascular:  Negative for chest pain and palpitations.  Skin:  Positive for rash.   Neurological:  Negative for dizziness and headaches.    Objective:   Today's Vitals: BP (!) 142/82 (BP Location: Left Arm)   Pulse 85   Ht 6\' 2"  (1.88 m)   Wt 180 lb (81.6 kg)   SpO2 96%   BMI 23.11 kg/m   Physical Exam HENT:     Head: Normocephalic.     Right Ear: External ear normal.     Left Ear: External ear normal.     Nose: No congestion.  Cardiovascular:     Rate and Rhythm: Normal rate and regular rhythm.     Pulses: Normal pulses.     Heart sounds: Normal heart sounds.  Pulmonary:     Effort: Pulmonary effort is normal.     Breath sounds: Normal breath sounds.  Skin:    Findings: Rash (Bilateral Lichenified poorly circumscribed plaques on the arms, back of the neck, lower abdomen, and needs) present. No lesion.      Assessment & Plan:   Skin rash Assessment & Plan: Rash likely of contact dermatitis from chemical use at his work We will treat with moderate potency topical corticosteroid Patient would like a referral to dermatology for further evaluation Referral placed to dermatology at [facility]  Orders: -     Triamcinolone Acetonide; Apply 1 Application topically 2 (two) times daily.  Dispense: 28.4 g; Refill: 0 -     Ambulatory referral to Dermatology  Colon cancer screening -     Fecal occult blood, imunochemical  Elevated BP without diagnosis of hypertension Assessment & Plan: BP is elevated today The patient notes that he has never been diagnosed with hypertension He denies headaches, dizziness, blurred vision, chest pain, palpitation Will reevaluate blood pressure in 2 weeks BP Readings from Last 3 Encounters:  01/02/22 (!) 142/82  11/28/21 126/80  06/05/21 137/88         Follow-up: Return in about 2 weeks (around 01/16/2022) for BP.   14/07/2021, FNP

## 2022-01-02 NOTE — Patient Instructions (Signed)
I appreciate the opportunity to provide care to you today!    Follow up: 2 weeks BP  Labs: please stop by the lab during the week to get your blood drawn (CBC, CMP, TSH, Lipid profile, HgA1c, Vit D)  Screening: HIV and Hep C  Please pick up your prescriptions at the pharmacy   Please continue to a heart-healthy diet and increase your physical activities. Try to exercise for at least three times a week.      It was a pleasure to see you and I look forward to continuing to work together on your health and well-being. Please do not hesitate to call the office if you need care or have questions about your care.   Have a wonderful day and week. With Gratitude, Gilmore Laroche MSN, FNP-BC

## 2022-01-02 NOTE — Assessment & Plan Note (Signed)
Rash likely of contact dermatitis from chemical use at his work We will treat with moderate potency topical corticosteroid Patient would like a referral to dermatology for further evaluation Referral placed to dermatology at [facility]

## 2022-01-08 ENCOUNTER — Telehealth: Payer: Self-pay | Admitting: Cardiology

## 2022-01-08 NOTE — Telephone Encounter (Signed)
Patient made aware that he may have received a call reminding him of his his coumadin appointment 01/10/22.

## 2022-01-08 NOTE — Telephone Encounter (Signed)
Patient states he is returning a call, but he does not know who called or what the call may have been regarding.

## 2022-01-10 ENCOUNTER — Encounter: Payer: Self-pay | Admitting: *Deleted

## 2022-01-10 ENCOUNTER — Ambulatory Visit: Payer: Managed Care, Other (non HMO) | Attending: Cardiology | Admitting: *Deleted

## 2022-01-10 DIAGNOSIS — Z952 Presence of prosthetic heart valve: Secondary | ICD-10-CM | POA: Diagnosis not present

## 2022-01-10 DIAGNOSIS — Z5181 Encounter for therapeutic drug level monitoring: Secondary | ICD-10-CM | POA: Diagnosis not present

## 2022-01-10 LAB — POCT INR: INR: 1.8 — AB (ref 2.0–3.0)

## 2022-01-10 NOTE — Patient Instructions (Signed)
Amiodarone discontinued by Dr Wyline Mood 05/28/21 Took warfarin this morning. Continue warfarin 1 1/2 tablets daily except 1 tablet on Sundays Recheck in 4 wks

## 2022-01-18 ENCOUNTER — Other Ambulatory Visit: Payer: Self-pay

## 2022-01-18 ENCOUNTER — Telehealth: Payer: Self-pay | Admitting: Family Medicine

## 2022-01-18 DIAGNOSIS — R21 Rash and other nonspecific skin eruption: Secondary | ICD-10-CM

## 2022-01-18 MED ORDER — TRIAMCINOLONE ACETONIDE 0.1 % EX CREA: 1.0000 | TOPICAL_CREAM | Freq: Two times a day (BID) | CUTANEOUS | 0 refills | Status: DC

## 2022-01-18 NOTE — Telephone Encounter (Signed)
Prescription Request  01/18/2022  Is this a "Controlled Substance" medicine? No  LOV: 01/02/2022  What is the name of the medication or equipment? triamcinolone cream (KENALOG) 0.1 % [[id number]]   Have you contacted your pharmacy to request a refill? No   Which pharmacy would you like this sent to?  [id number] - [place], [place] - [address] [place] [address] Phone: [phone] Fax: [phone]    Patient notified that their request is being sent to the clinical staff for review and that they should receive a response within 2 business days.   Please advise at Mobile [phone] (mobile)

## 2022-01-18 NOTE — Telephone Encounter (Signed)
Refill sent.

## 2022-01-22 ENCOUNTER — Ambulatory Visit (INDEPENDENT_AMBULATORY_CARE_PROVIDER_SITE_OTHER): Payer: Managed Care, Other (non HMO) | Admitting: Family Medicine

## 2022-01-22 ENCOUNTER — Encounter: Payer: Self-pay | Admitting: Family Medicine

## 2022-01-22 VITALS — BP 138/86 | HR 80 | Ht 74.0 in | Wt 181.0 lb

## 2022-01-22 DIAGNOSIS — F1090 Alcohol use, unspecified, uncomplicated: Secondary | ICD-10-CM

## 2022-01-22 DIAGNOSIS — Z720 Tobacco use: Secondary | ICD-10-CM | POA: Diagnosis not present

## 2022-01-22 DIAGNOSIS — R030 Elevated blood-pressure reading, without diagnosis of hypertension: Secondary | ICD-10-CM | POA: Diagnosis not present

## 2022-01-22 DIAGNOSIS — Z789 Other specified health status: Secondary | ICD-10-CM

## 2022-01-22 NOTE — Assessment & Plan Note (Signed)
Smokes about 1 pack/day  Asked about quitting: confirms that he currently smokes cigarettes Advise to quit smoking: Educated about QUITTING to reduce the risk of cancer, cardio and cerebrovascular disease. Assess willingness: Unwilling to quit at this time, but is working on cutting back. Assist with counseling and pharmacotherapy: Counseled for 5 minutes and literature provided. Arrange for follow up: follow up in 3 months and continue to offer help.    Reviewed: Smoking is harmful to your health and increases your risk for cancer, COPD, high blood pressure, cataracts, digestive problems, or health problems , such as gum disease, mouth sores, and tooth loss and loss of taste and smell. Smoking irritates your throat and causes coughing.   

## 2022-01-22 NOTE — Progress Notes (Signed)
Established Patient Office Visit  Subjective:  Patient ID: Isaac Diaz, male    DOB: [birth year]  Age: 56 y.o. MRN: [id number]  CC:  Chief Complaint  Patient presents with   Follow-up    2 week f/u.     HPI Isaac Diaz is a 56 y.o. male with past medical history of elevated BP without diagnosis of hypertension presents for f/u of  chronic medical conditions. For the details of today's visit, please refer to the assessment and plan.     Past Medical History:  Diagnosis Date   Allergy    Aortic stenosis    a. s/p AVR using an On-X 23 mm mechanical valve on 04/04/2021   Enlarged prostate    Heart murmur     Past Surgical History:  Procedure Laterality Date   AMPUTATION FINGER Right    fingertip right long   AORTIC VALVE REPLACEMENT N/A 04/04/2021   Procedure: AORTIC VALVE REPLACEMENT (AVR) USING ON-X VALVE SIZE ;  Surgeon: Loreli Slot, MD;  Location: [facility] OR;  Service: Open Heart Surgery;  Laterality: N/A;  mechanical   RIGHT HEART CATH AND CORONARY ANGIOGRAPHY N/A 03/16/2021   Procedure: RIGHT HEART CATH AND CORONARY ANGIOGRAPHY;  Surgeon: Kathleene Hazel, MD;  Location: [facility];  Service: Cardiovascular;  Laterality: N/A;   TEE WITHOUT CARDIOVERSION N/A 04/04/2021   Procedure: TRANSESOPHAGEAL ECHOCARDIOGRAM (TEE);  Surgeon: Loreli Slot, MD;  Location: [facility] OR;  Service: Open Heart Surgery;  Laterality: N/A;   WISDOM TOOTH EXTRACTION      Family History  Problem Relation Age of Onset   Cancer Mother        stomach   Stroke Father 64   Aneurysm Sister        brain aneurysm   Cancer Maternal Grandfather        lung    Social History   Socioeconomic History   Marital status: Legally Separated    Spouse name: Not on file   Number of children: 6   Years of education: 12   Highest education level: Not on file  Occupational History   Occupation: concrete pre cast  Tobacco Use   Smoking status: Every Day     Packs/day: 0.50    Years: 28.00    Total pack years: 14.00    Types: Cigarettes    Start date: 02/12/1988   Smokeless tobacco: Never  Vaping Use   Vaping Use: Never used  Substance and Sexual Activity   Alcohol use: Yes    Alcohol/week: 14.0 standard drinks of alcohol    Types: 14 Cans of beer per week    Comment:  weekly   Drug use: Yes    Types: Marijuana    Comment: rare   Sexual activity: Not Currently    Birth control/protection: None  Other Topics Concern   Not on file  Social History Narrative   Born in [place]   Worked in [place] for 17 years   Moved back Dec 2018   Lives with Sister, Roselee [place], [place]   Family nearby   Social Determinants of Health   Financial Resource Strain: Not on file  Food Insecurity: Not on file  Transportation Needs: Not on file  Physical Activity: Not on file  Stress: Not on file  Social Connections: Not on file  Intimate Partner Violence: Not on file    Outpatient Medications Prior to Visit  Medication Sig Dispense Refill   aspirin EC 81 MG EC tablet  Take 1 tablet (81 mg total) by mouth daily. Swallow whole. 30 tablet 11   metoprolol succinate (TOPROL XL) 25 MG 24 hr tablet Take 0.5 tablets (12.5 mg total) by mouth daily. 45 tablet 3   triamcinolone cream (KENALOG) 0.1 % Apply 1 Application topically 2 (two) times daily. 28.4 g 0   warfarin (COUMADIN) 5 MG tablet TAKE  11/2 TO 2 TABLETS BY MOUTH DAILY AS DIRECTED BY COUMADIN CLINIC 60 tablet 2   Facility-Administered Medications Prior to Visit  Medication Dose Route Frequency Provider Last Rate Last Admin   sodium chloride flush (NS) 0.9 % injection 3 mL  3 mL Intravenous Q12H Branch, Dorothe Pea, MD        Allergies  Allergen Reactions   Other Anaphylaxis    Feathers (pillow stuffing)   Chocolate Hives    ROS Review of Systems  Constitutional:  Negative for fatigue and fever.  Eyes:  Negative for visual disturbance.  Respiratory:  Negative for chest tightness and shortness  of breath.   Cardiovascular:  Negative for chest pain and palpitations.  Neurological:  Negative for dizziness and headaches.      Objective:    Physical Exam HENT:     Head: Normocephalic.     Right Ear: External ear normal.     Left Ear: External ear normal.  Cardiovascular:     Rate and Rhythm: Regular rhythm.     Heart sounds: No murmur heard. Pulmonary:     Effort: No respiratory distress.     Breath sounds: Normal breath sounds.  Neurological:     Mental Status: He is alert.     BP 138/86 (BP Location: Left Arm)   Pulse 80   Ht 6\' 2"  (1.88 m)   Wt 181 lb 0.6 oz (82.1 kg)   SpO2 97%   BMI 23.24 kg/m  Wt Readings from Last 3 Encounters:  01/22/22 181 lb 0.6 oz (82.1 kg)  01/02/22 180 lb (81.6 kg)  11/28/21 183 lb (83 kg)    Lab Results  Component Value Date   TSH 0.676 12/13/2021   Lab Results  Component Value Date   WBC 7.2 12/13/2021   HGB 15.1 12/13/2021   HCT 43.6 12/13/2021   MCV 95.4 12/13/2021   PLT 184 12/13/2021   Lab Results  Component Value Date   NA 137 12/13/2021   K 3.8 12/13/2021   CO2 21 (L) 12/13/2021   GLUCOSE 82 12/13/2021   BUN 9 12/13/2021   CREATININE 0.76 12/13/2021   BILITOT 1.8 (H) 12/13/2021   ALKPHOS 62 12/13/2021   AST 22 12/13/2021   ALT 16 12/13/2021   PROT 7.5 12/13/2021   ALBUMIN 4.0 12/13/2021   CALCIUM 9.3 12/13/2021   ANIONGAP 9 12/13/2021   Lab Results  Component Value Date   CHOL 175 12/13/2021   Lab Results  Component Value Date   HDL 48 12/13/2021   Lab Results  Component Value Date   LDLCALC 113 (H) 12/13/2021   Lab Results  Component Value Date   TRIG 72 12/13/2021   Lab Results  Component Value Date   CHOLHDL 3.6 12/13/2021   Lab Results  Component Value Date   HGBA1C 5.6 12/13/2021      Assessment & Plan:  Elevated BP without diagnosis of hypertension Assessment & Plan: Controlled Encourage low-sodium diet with increased physical activities   Tobacco abuse Assessment &  Plan: Smokes about 1 pack/day  Asked about quitting: confirms that he currently smokes cigarettes Advise to  quit smoking: Educated about QUITTING to reduce the risk of cancer, cardio and cerebrovascular disease. Assess willingness: Unwilling to quit at this time, but is working on cutting back. Assist with counseling and pharmacotherapy: Counseled for 5 minutes and literature provided. Arrange for follow up: follow up in 3 months and continue to offer help.    Reviewed: Smoking is harmful to your health and increases your risk for cancer, COPD, high blood pressure, cataracts, digestive problems, or health problems , such as gum disease, mouth sores, and tooth loss and loss of taste and smell. Smoking irritates your throat and causes coughing.    Alcohol use Assessment & Plan: He reports drinking 6 pack of beer daily after work Recommended drinking less than 2 drinks daily to reduce the risk of alcohol related to harm Patient verbalized understanding     Follow-up: No follow-ups on file.   Gilmore Laroche, FNP

## 2022-01-22 NOTE — Assessment & Plan Note (Signed)
Controlled Encourage low-sodium diet with increased physical activities

## 2022-01-22 NOTE — Patient Instructions (Addendum)
I appreciate the opportunity to provide care to you today!    Follow up:  3 months    Smoking is harmful to your health and increases your risk for cancer, COPD, high blood pressure, cataracts, digestive problems, or health problems , such as gum disease, mouth sores, and tooth loss and loss of taste and smell. Smoking irritates your throat and causes coughing.      Please continue to a heart-healthy diet and increase your physical activities. Try to exercise for at least three times a week.      It was a pleasure to see you and I look forward to continuing to work together on your health and well-being. Please do not hesitate to call the office if you need care or have questions about your care.   Have a wonderful day and week. With Gratitude, Gilmore Laroche MSN, FNP-BC

## 2022-01-22 NOTE — Assessment & Plan Note (Addendum)
He reports drinking 6 pack of beer daily after work Recommended drinking less than 2 drinks daily to reduce the risk of alcohol related to harm Patient verbalized understanding

## 2022-01-24 ENCOUNTER — Ambulatory Visit (INDEPENDENT_AMBULATORY_CARE_PROVIDER_SITE_OTHER): Payer: Managed Care, Other (non HMO) | Admitting: Dermatology

## 2022-01-24 VITALS — BP 122/85 | HR 61

## 2022-01-24 DIAGNOSIS — R21 Rash and other nonspecific skin eruption: Secondary | ICD-10-CM

## 2022-01-24 DIAGNOSIS — L235 Allergic contact dermatitis due to other chemical products: Secondary | ICD-10-CM

## 2022-01-24 NOTE — Progress Notes (Signed)
   New Patient Visit  Subjective  Isaac Diaz is a 56 y.o. 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 Rash at trunk, arms, knees. Patient has been using triamcinolone 1 % cream on affected areas prescribed by pcp. ).  The following portions of the chart were reviewed this encounter and updated as appropriate:   Tobacco  Allergies  Meds  Problems  Med Hx  Surg Hx  Fam Hx     Review of Systems:  No other skin or systemic complaints except as noted in HPI or Assessment and Plan.  Objective  Well appearing patient in no apparent distress; mood and affect are within normal limits.  A focused examination was performed including neck, trunk, back, b/l arms, b/l legs. Relevant physical exam findings are noted in the Assessment and Plan.  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   Recommend patch allergy testing for further information.   Instructed patient patch test will be applied m/t , then come back in 2 days for reading, then another reading in 8 days.   Can continue to use tmc as prescribed by pcp.   Return for monday or tuesday patch reading, 2 day patch reading , then 12/27 patch reading.  IAsher Muir, CMA, am acting as scribe for Armida Sans, MD. Documentation: I have reviewed the above documentation for accuracy and completeness, and I agree with the above.  Armida Sans, MD

## 2022-01-24 NOTE — Patient Instructions (Addendum)
Topical steroids (such as triamcinolone, fluocinolone, fluocinonide, mometasone, clobetasol, halobetasol, betamethasone, hydrocortisone) can cause thinning and lightening of the skin if they are used for too long in the same area. Your physician has selected the right strength medicine for your problem and area affected on the body. Please use your medication only as directed by your physician to prevent side effects.    Due to recent changes in healthcare laws, you may see results of your pathology and/or laboratory studies on MyChart before the doctors have had a chance to review them. We understand that in some cases there may be results that are confusing or concerning to you. Please understand that not all results are received at the same time and often the doctors may need to interpret multiple results in order to provide you with the best plan of care or course of treatment. Therefore, we ask that you please give us 2 business days to thoroughly review all your results before contacting the office for clarification. Should we see a critical lab result, you will be contacted sooner.   If You Need Anything After Your Visit  If you have any questions or concerns for your doctor, please call our main line at [phone] and press option 4 to reach your doctor's medical assistant. If no one answers, please leave a voicemail as directed and we will return your call as soon as possible. Messages left after 4 pm will be answered the following business day.   You may also send us a message via MyChart. We typically respond to MyChart messages within 1-2 business days.  For prescription refills, please ask your pharmacy to contact our office. Our fax number is [phone].  If you have an urgent issue when the clinic is closed that cannot wait until the next business day, you can page your doctor at the number below.    Please note that while we do our best to be available for urgent issues outside of  office hours, we are not available 24/7.   If you have an urgent issue and are unable to reach us, you may choose to seek medical care at your doctor's office, retail clinic, urgent care center, or emergency room.  If you have a medical emergency, please immediately call 911 or go to the emergency department.  Pager Numbers  - Dr. Kowalski: [phone]  - Dr. Moye: [phone]  - Dr. Stewart: [phone]  In the event of inclement weather, please call our main line at [phone] for an update on the status of any delays or closures.  Dermatology Medication Tips: Please keep the boxes that topical medications come in in order to help keep track of the instructions about where and how to use these. Pharmacies typically print the medication instructions only on the boxes and not directly on the medication tubes.   If your medication is too expensive, please contact our office at [phone] option 4 or send us a message through MyChart.   We are unable to tell what your co-pay for medications will be in advance as this is different depending on your insurance coverage. However, we may be able to find a substitute medication at lower cost or fill out paperwork to get insurance to cover a needed medication.   If a prior authorization is required to get your medication covered by your insurance company, please allow us 1-2 business days to complete this process.  Drug prices often vary depending on where the prescription is filled and some pharmacies   may offer cheaper prices.  The website www.goodrx.com contains coupons for medications through different pharmacies. The prices here do not account for what the cost may be with help from insurance (it may be cheaper with your insurance), but the website can give you the price if you did not use any insurance.  - You can print the associated coupon and take it with your prescription to the pharmacy.  - You may also stop by our office during  regular business hours and pick up a GoodRx coupon card.  - If you need your prescription sent electronically to a different pharmacy, notify our office through [facility] MyChart or by phone at [phone] option 4.     Si Usted Necesita Algo Despus de Su Visita  Tambin puede enviarnos un mensaje a travs de MyChart. Por lo general respondemos a los mensajes de MyChart en el transcurso de 1 a 2 das hbiles.  Para renovar recetas, por favor pida a su farmacia que se ponga en contacto con nuestra oficina. Nuestro nmero de fax es el [phone].  Si tiene un asunto urgente cuando la clnica est cerrada y que no puede esperar hasta el siguiente da hbil, puede llamar/localizar a su doctor(a) al nmero que aparece a continuacin.   Por favor, tenga en cuenta que aunque hacemos todo lo posible para estar disponibles para asuntos urgentes fuera del horario de oficina, no estamos disponibles las 24 horas del da, los 7 das de la semana.   Si tiene un problema urgente y no puede comunicarse con nosotros, puede optar por buscar atencin mdica  en el consultorio de su doctor(a), en una clnica privada, en un centro de atencin urgente o en una sala de emergencias.  Si tiene una emergencia mdica, por favor llame inmediatamente al 911 o vaya a la sala de emergencias.  Nmeros de bper  - Dr. Kowalski: [phone]  - Dra. Moye: [phone]  - Dra. Stewart: [phone]  En caso de inclemencias del tiempo, por favor llame a nuestra lnea principal al [phone] para una actualizacin sobre el estado de cualquier retraso o cierre.  Consejos para la medicacin en dermatologa: Por favor, guarde las cajas en las que vienen los medicamentos de uso tpico para ayudarle a seguir las instrucciones sobre dnde y cmo usarlos. Las farmacias generalmente imprimen las instrucciones del medicamento slo en las cajas y no directamente en los tubos del medicamento.   Si su medicamento es muy  caro, por favor, pngase en contacto con nuestra oficina llamando al [phone] y presione la opcin 4 o envenos un mensaje a travs de MyChart.   No podemos decirle cul ser su copago por los medicamentos por adelantado ya que esto es diferente dependiendo de la cobertura de su seguro. Sin embargo, es posible que podamos encontrar un medicamento sustituto a menor costo o llenar un formulario para que el seguro cubra el medicamento que se considera necesario.   Si se requiere una autorizacin previa para que su compaa de seguros cubra su medicamento, por favor permtanos de 1 a 2 das hbiles para completar este proceso.  Los precios de los medicamentos varan con frecuencia dependiendo del lugar de dnde se surte la receta y alguna farmacias pueden ofrecer precios ms baratos.  El sitio web www.goodrx.com tiene cupones para medicamentos de diferentes farmacias. Los precios aqu no tienen en cuenta lo que podra costar con la ayuda del seguro (puede ser ms barato con su seguro), pero el sitio web puede darle el precio si no   utiliz ningn seguro.  - Puede imprimir el cupn correspondiente y llevarlo con su receta a la farmacia.  - Tambin puede pasar por nuestra oficina durante el horario de atencin regular y recoger una tarjeta de cupones de GoodRx.  - Si necesita que su receta se enve electrnicamente a una farmacia diferente, informe a nuestra oficina a travs de MyChart de [facility] o por telfono llamando al [phone] y presione la opcin 4.  

## 2022-01-28 ENCOUNTER — Ambulatory Visit (INDEPENDENT_AMBULATORY_CARE_PROVIDER_SITE_OTHER): Payer: Managed Care, Other (non HMO)

## 2022-01-28 DIAGNOSIS — R21 Rash and other nonspecific skin eruption: Secondary | ICD-10-CM | POA: Diagnosis not present

## 2022-01-28 NOTE — Progress Notes (Signed)
Patch testing was performed on 01/28/2022 using standard technique. True test x 36 applied to back at visit today. Pt advised to keep panels dry until removal in 2 days for first read.  Photo taken.  Dorathy Daft, RMA

## 2022-01-30 ENCOUNTER — Ambulatory Visit: Payer: Managed Care, Other (non HMO)

## 2022-01-30 DIAGNOSIS — R21 Rash and other nonspecific skin eruption: Secondary | ICD-10-CM

## 2022-01-30 NOTE — Patient Instructions (Signed)
Due to recent changes in healthcare laws, you may see results of your pathology and/or laboratory studies on MyChart before the doctors have had a chance to review them. We understand that in some cases there may be results that are confusing or concerning to you. Please understand that not all results are received at the same time and often the doctors may need to interpret multiple results in order to provide you with the best plan of care or course of treatment. Therefore, we ask that you please give us 2 business days to thoroughly review all your results before contacting the office for clarification. Should we see a critical lab result, you will be contacted sooner.   If You Need Anything After Your Visit  If you have any questions or concerns for your doctor, please call our main line at [phone] and press option 4 to reach your doctor's medical assistant. If no one answers, please leave a voicemail as directed and we will return your call as soon as possible. Messages left after 4 pm will be answered the following business day.   You may also send us a message via MyChart. We typically respond to MyChart messages within 1-2 business days.  For prescription refills, please ask your pharmacy to contact our office. Our fax number is [phone].  If you have an urgent issue when the clinic is closed that cannot wait until the next business day, you can page your doctor at the number below.    Please note that while we do our best to be available for urgent issues outside of office hours, we are not available 24/7.   If you have an urgent issue and are unable to reach us, you may choose to seek medical care at your doctor's office, retail clinic, urgent care center, or emergency room.  If you have a medical emergency, please immediately call 911 or go to the emergency department.  Pager Numbers  - Dr. Kowalski: [phone]  - Dr. Moye: [phone]  - Dr. Stewart:  [phone]  In the event of inclement weather, please call our main line at [phone] for an update on the status of any delays or closures.  Dermatology Medication Tips: Please keep the boxes that topical medications come in in order to help keep track of the instructions about where and how to use these. Pharmacies typically print the medication instructions only on the boxes and not directly on the medication tubes.   If your medication is too expensive, please contact our office at [phone] option 4 or send us a message through MyChart.   We are unable to tell what your co-pay for medications will be in advance as this is different depending on your insurance coverage. However, we may be able to find a substitute medication at lower cost or fill out paperwork to get insurance to cover a needed medication.   If a prior authorization is required to get your medication covered by your insurance company, please allow us 1-2 business days to complete this process.  Drug prices often vary depending on where the prescription is filled and some pharmacies may offer cheaper prices.  The website www.goodrx.com contains coupons for medications through different pharmacies. The prices here do not account for what the cost may be with help from insurance (it may be cheaper with your insurance), but the website can give you the price if you did not use any insurance.  - You can print the associated coupon and take it with   your prescription to the pharmacy.  - You may also stop by our office during regular business hours and pick up a GoodRx coupon card.  - If you need your prescription sent electronically to a different pharmacy, notify our office through [facility] MyChart or by phone at [phone] option 4.     Si Usted Necesita Algo Despus de Su Visita  Tambin puede enviarnos un mensaje a travs de MyChart. Por lo general respondemos a los mensajes de MyChart en el transcurso de 1 a 2  das hbiles.  Para renovar recetas, por favor pida a su farmacia que se ponga en contacto con nuestra oficina. Nuestro nmero de fax es el [phone].  Si tiene un asunto urgente cuando la clnica est cerrada y que no puede esperar hasta el siguiente da hbil, puede llamar/localizar a su doctor(a) al nmero que aparece a continuacin.   Por favor, tenga en cuenta que aunque hacemos todo lo posible para estar disponibles para asuntos urgentes fuera del horario de oficina, no estamos disponibles las 24 horas del da, los 7 das de la semana.   Si tiene un problema urgente y no puede comunicarse con nosotros, puede optar por buscar atencin mdica  en el consultorio de su doctor(a), en una clnica privada, en un centro de atencin urgente o en una sala de emergencias.  Si tiene una emergencia mdica, por favor llame inmediatamente al 911 o vaya a la sala de emergencias.  Nmeros de bper  - Dr. Kowalski: [phone]  - Dra. Moye: [phone]  - Dra. Stewart: [phone]  En caso de inclemencias del tiempo, por favor llame a nuestra lnea principal al [phone] para una actualizacin sobre el estado de cualquier retraso o cierre.  Consejos para la medicacin en dermatologa: Por favor, guarde las cajas en las que vienen los medicamentos de uso tpico para ayudarle a seguir las instrucciones sobre dnde y cmo usarlos. Las farmacias generalmente imprimen las instrucciones del medicamento slo en las cajas y no directamente en los tubos del medicamento.   Si su medicamento es muy caro, por favor, pngase en contacto con nuestra oficina llamando al [phone] y presione la opcin 4 o envenos un mensaje a travs de MyChart.   No podemos decirle cul ser su copago por los medicamentos por adelantado ya que esto es diferente dependiendo de la cobertura de su seguro. Sin embargo, es posible que podamos encontrar un medicamento sustituto a menor costo o llenar un formulario para que el  seguro cubra el medicamento que se considera necesario.   Si se requiere una autorizacin previa para que su compaa de seguros cubra su medicamento, por favor permtanos de 1 a 2 das hbiles para completar este proceso.  Los precios de los medicamentos varan con frecuencia dependiendo del lugar de dnde se surte la receta y alguna farmacias pueden ofrecer precios ms baratos.  El sitio web www.goodrx.com tiene cupones para medicamentos de diferentes farmacias. Los precios aqu no tienen en cuenta lo que podra costar con la ayuda del seguro (puede ser ms barato con su seguro), pero el sitio web puede darle el precio si no utiliz ningn seguro.  - Puede imprimir el cupn correspondiente y llevarlo con su receta a la farmacia.  - Tambin puede pasar por nuestra oficina durante el horario de atencin regular y recoger una tarjeta de cupones de GoodRx.  - Si necesita que su receta se enve electrnicamente a una farmacia diferente, informe a nuestra oficina a travs de MyChart de [facility]   o por telfono llamando al [phone] y presione la opcin 4.  

## 2022-01-30 NOTE — Progress Notes (Unsigned)
   Follow-Up Visit   Subjective  Isaac Diaz is a 56 y.o. male who presents for the following: Patch testing  (Patient is here today for the 2nd visit to have the patch tests removed. Patient c/o itching at patches).   The following portions of the chart were reviewed this encounter and updated as appropriate:       Review of Systems:  No other skin or systemic complaints except as noted in HPI or Assessment and Plan.  Objective  Well appearing patient in no apparent distress; mood and affect are within normal limits.  A focused examination was performed including the back. Relevant physical exam findings are noted in the Assessment and Plan.  Back         Assessment & Plan  Rash and other nonspecific skin eruption Back  Patch tests removed today and outlined. Discussed avoiding submerging patches in water and excessive sweating, OK to shower but avoid cleaning area where patches were.    T.R.U.E. Test - 01/30/22 1500       Test Information   Manufacturer Other    Location Back    Number of Test 36    Reading Interval Day 3    Panel Panel 1;Panel 2      Panel 1   1. Nickel Sulfate 0    2. Wool Alcohols 0    3. Neomycin Sulfate 0    4. Potassium Dichromate 1    5. Caine Mix 0    6. Fragrance Mix 0    7. Colophony 0    8. Paraben Mix 0    9. Negative Control 0    10. Balsam of [place] 0    11. Ethylenediamine Dihydrochloride 0    12. Cobalt Dichloride 0      Panel 2   13. p-tert Butylphenol Formaldehyde Resin 0    14. Epoxy Resin 0    15. Carba Mix 1    16.  Black Rubber Mix 0    17. Cl+ Me-Isothiazolinone 0    18. Quaternium-15 0    19. Methyldibromo Glutaronitrile 0    20. p-Phenylenediamine 0    21. Formaldehyde 0    22. Mercapto Mix 0    23. Thimerosal 0    24. Thiuram Mix 1      Panel 3   25. Diazolidinyl Urea 0    26. Quinoline Mix 0    27. Tixocortol-21-Pivalate 0    28. Gold Sodium Thiosulfate 1    29. Imidazolidinyl Urea 0    30.  Budesonide 0    31. Hydrocortisone-17-Butyrate 0    32. Mercaptobenzothiazole 0    33. Bacitracin 0    34. Parthenolide 0    35. Disperse Blue 106 0    36. 2-Bromo-2-Nitropropane-1,3-diol 0               Return for appointment as scheduled.

## 2022-01-31 ENCOUNTER — Ambulatory Visit: Payer: Managed Care, Other (non HMO) | Admitting: Dermatology

## 2022-01-31 NOTE — Progress Notes (Deleted)
   Follow-Up Visit   Subjective  Isaac Diaz is a 56 y.o. male who presents for the following: Patch testing  (Patient is here today for the 2nd visit to have the patch tests removed. Patient c/o itching at patches).   The following portions of the chart were reviewed this encounter and updated as appropriate:       Review of Systems:  No other skin or systemic complaints except as noted in HPI or Assessment and Plan.  Objective  Well appearing patient in no apparent distress; mood and affect are within normal limits.  A focused examination was performed including the back. Relevant physical exam findings are noted in the Assessment and Plan.  Back         Assessment & Plan  Rash and other nonspecific skin eruption Back  Patch tests removed today and outlined. Discussed avoiding submerging patches in water and excessive sweating, OK to shower but avoid cleaning area where patches were.    T.R.U.E. Test - 01/30/22 1500       Test Information   Manufacturer Other    Location Back    Number of Test 36    Reading Interval Day 3    Panel Panel 1;Panel 2      Panel 1   1. Nickel Sulfate 0    2. Wool Alcohols 0    3. Neomycin Sulfate 0    4. Potassium Dichromate 1    5. Caine Mix 0    6. Fragrance Mix 0    7. Colophony 0    8. Paraben Mix 0    9. Negative Control 0    10. Balsam of [place] 0    11. Ethylenediamine Dihydrochloride 0    12. Cobalt Dichloride 0      Panel 2   13. p-tert Butylphenol Formaldehyde Resin 0    14. Epoxy Resin 0    15. Carba Mix 1    16.  Black Rubber Mix 0    17. Cl+ Me-Isothiazolinone 0    18. Quaternium-15 0    19. Methyldibromo Glutaronitrile 0    20. p-Phenylenediamine 0    21. Formaldehyde 0    22. Mercapto Mix 0    23. Thimerosal 0    24. Thiuram Mix 1      Panel 3   25. Diazolidinyl Urea 0    26. Quinoline Mix 0    27. Tixocortol-21-Pivalate 0    28. Gold Sodium Thiosulfate 1    29. Imidazolidinyl Urea 0    30.  Budesonide 0    31. Hydrocortisone-17-Butyrate 0    32. Mercaptobenzothiazole 0    33. Bacitracin 0    34. Parthenolide 0    35. Disperse Blue 106 0    36. 2-Bromo-2-Nitropropane-1,3-diol 0               Return for appointment as scheduled. 

## 2022-02-06 ENCOUNTER — Ambulatory Visit: Payer: Managed Care, Other (non HMO) | Admitting: Dermatology

## 2022-02-07 ENCOUNTER — Encounter: Payer: Self-pay | Admitting: Dermatology

## 2022-02-07 ENCOUNTER — Ambulatory Visit: Payer: Managed Care, Other (non HMO) | Attending: Cardiology | Admitting: *Deleted

## 2022-02-07 ENCOUNTER — Ambulatory Visit (INDEPENDENT_AMBULATORY_CARE_PROVIDER_SITE_OTHER): Payer: Managed Care, Other (non HMO) | Admitting: Dermatology

## 2022-02-07 VITALS — BP 132/78 | HR 76

## 2022-02-07 DIAGNOSIS — R21 Rash and other nonspecific skin eruption: Secondary | ICD-10-CM | POA: Diagnosis not present

## 2022-02-07 DIAGNOSIS — Z5181 Encounter for therapeutic drug level monitoring: Secondary | ICD-10-CM | POA: Diagnosis not present

## 2022-02-07 DIAGNOSIS — Z952 Presence of prosthetic heart valve: Secondary | ICD-10-CM

## 2022-02-07 LAB — POCT INR: INR: 1.3 — AB (ref 2.0–3.0)

## 2022-02-07 MED ORDER — CLOBETASOL PROPIONATE 0.05 % EX CREA: TOPICAL_CREAM | CUTANEOUS | 2 refills | Status: DC

## 2022-02-07 NOTE — Progress Notes (Signed)
Follow-Up Visit   Subjective  Isaac Diaz is a 56 y.o. male who presents for the following: Rash (Here for final patch test reading. Areas on back itching).    The following portions of the chart were reviewed this encounter and updated as appropriate:      Review of Systems: No other skin or systemic complaints except as noted in HPI or Assessment and Plan.   Objective  Well appearing patient in no apparent distress; mood and affect are within normal limits.  All skin waist up examined.  B/L wrists, neck, knee Lichenified plaques with scattered scaly papules at wrists, severe. Hyperpigmented macules and papules at posterior neck. Hyperpigmented lichenified plaque at left upper knee   Assessment & Plan    T.R.U.E. Test - 02/07/22 1300       Test Information   Manufacturer Other    Location Back    Number of Test 36    Reading Interval Day 8    Panel Panel 1;Panel 2;Panel 3      Panel 1   1. Nickel Sulfate 0    2. Wool Alcohols 0    3. Neomycin Sulfate 0    4. Potassium Dichromate 2    5. Caine Mix 0    6. Fragrance Mix 0    7. Colophony 0    8. Paraben Mix 0    9. Negative Control 0    10. Balsam of [place] 0    11. Ethylenediamine Dihydrochloride 0    12. Cobalt Dichloride 0      Panel 2   13. p-tert Butylphenol Formaldehyde Resin 0    14. Epoxy Resin 0    15. Carba Mix 2    16.  Black Rubber Mix 0    17. Cl+ Me-Isothiazolinone 0    18. Quaternium-15 0    19. Methyldibromo Glutaronitrile 0    20. p-Phenylenediamine 0    21. Formaldehyde 0    22. Mercapto Mix 0    23. Thimerosal 0    24. Thiuram Mix 2      Panel 3   25. Diazolidinyl Urea 0    26. Quinoline Mix 0    27. Tixocortol-21-Pivalate 0    28. Gold Sodium Thiosulfate 1    29. Imidazolidinyl Urea 0    30. Budesonide 0    31. Hydrocortisone-17-Butyrate 0    32. Mercaptobenzothiazole 0    33. Bacitracin 0    34. Parthenolide 0    35. Disperse Blue 106 0    36.  2-Bromo-2-Nitropropane-1,3-diol 0             Rash B/L wrists, neck, knee  Chronic and persistent condition with duration or expected duration over one year. Condition is bothersome/symptomatic for patient. Currently flared.  Reactions to #4 Potassium dichromate, #15 Carba Mix, #24 Thiuram Mix. Handouts given.   Start Clobetasol 0.05% cream twice daily to affected areas until clear. Avoid applying to face, groin, and axilla. Use as directed. Long-term use can cause thinning of the skin.  Topical steroids (such as triamcinolone, fluocinolone, fluocinonide, mometasone, clobetasol, halobetasol, betamethasone, hydrocortisone) can cause thinning and lightening of the skin if they are used for too long in the same area. Your physician has selected the right strength medicine for your problem and area affected on the body. Please use your medication only as directed by your physician to prevent side effects.   Call back if would like a note for employer to change PPE/Gloves.  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   clobetasol cream (TEMOVATE) 0.05 % - B/L wrists, neck, knee Apply twice daily until clear. Avoid applying to face, groin, and axilla.   Return in 4 weeks (on 03/07/2022) for Rash Follow Up.  I, Lawson Radar, CMA, am acting as scribe for Willeen Niece, MD.  Documentation: I have reviewed the above documentation for accuracy and completeness, and I agree with the above.  Willeen Niece MD

## 2022-02-07 NOTE — Patient Instructions (Signed)
Amiodarone discontinued by Dr Wyline Mood 05/28/21 Took warfarin this morning. Take warfarin 2.5  tablets tonight then increase dose to 1 1/2 tablets daily  Recheck in 2 wks

## 2022-02-07 NOTE — Patient Instructions (Addendum)
Start Clobetasol 0.05% cream twice daily to affected areas until clear. Avoid applying to face, groin, and axilla. Use as directed. Long-term use can cause thinning of the skin.  Topical steroids (such as triamcinolone, fluocinolone, fluocinonide, mometasone, clobetasol, halobetasol, betamethasone, hydrocortisone) can cause thinning and lightening of the skin if they are used for too long in the same area. Your physician has selected the right strength medicine for your problem and area affected on the body. Please use your medication only as directed by your physician to prevent side effects.    Call back if would like a note to change PPE/Gloves for employer.  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   Request MSDS information for gloves worn at work.    Due to recent changes in healthcare laws, you may see results of your pathology and/or laboratory studies on MyChart before the doctors have had a chance to review them. We understand that in some cases there may be results that are confusing or concerning to you. Please understand that not all results are received at the same time and often the doctors may need to interpret multiple results in order to provide you with the best plan of care or course of treatment. Therefore, we ask that you please give [place] 2 business days to thoroughly review all your results before contacting the office for clarification. Should we see a critical lab result, you will be contacted sooner.   If You Need Anything After Your Visit  If you have any questions or concerns for your doctor, please call our main line at [phone] and press option 4 to reach your doctor's medical assistant. If no one answers, please leave a voicemail as directed and we will return your call as soon as possible. Messages left after 4 pm will be answered the following business day.   You may also send [place] a message via MyChart. We typically respond to MyChart messages within  1-2 business days.  For prescription refills, please ask your pharmacy to contact our office. Our fax number is [phone].  If you have an urgent issue when the clinic is closed that cannot wait until the next business day, you can page your doctor at the number below.    Please note that while we do our best to be available for urgent issues outside of office hours, we are not available 24/7.   If you have an urgent issue and are unable to reach [place], you may choose to seek medical care at your doctor's office, retail clinic, urgent care center, or emergency room.  If you have a medical emergency, please immediately call 911 or go to the emergency department.  Pager Numbers  - Dr. Gwen Pounds: [phone]  - Dr. Neale Burly: [phone]  - Dr. Roseanne Reno: [phone]  In the event of inclement weather, please call our main line at [phone] for an update on the status of any delays or closures.  Dermatology Medication Tips: Please keep the boxes that topical medications come in in order to help keep track of the instructions about where and how to use these. Pharmacies typically print the medication instructions only on the boxes and not directly on the medication tubes.   If your medication is too expensive, please contact our office at [phone] option 4 or send [place] a message through MyChart.   We are unable to tell what your co-pay for medications will be in advance as this is different depending on your insurance coverage. However, we may  be able to find a substitute medication at lower cost or fill out paperwork to get insurance to cover a needed medication.   If a prior authorization is required to get your medication covered by your insurance company, please allow [place] 1-2 business days to complete this process.  Drug prices often vary depending on where the prescription is filled and some pharmacies may offer cheaper prices.  The website www.goodrx.com contains coupons for  medications through different pharmacies. The prices here do not account for what the cost may be with help from insurance (it may be cheaper with your insurance), but the website can give you the price if you did not use any insurance.  - You can print the associated coupon and take it with your prescription to the pharmacy.  - You may also stop by our office during regular business hours and pick up a GoodRx coupon card.  - If you need your prescription sent electronically to a different pharmacy, notify our office through [facility] or by phone at [phone] option 4.     Si Usted Necesita Algo Despus de Su Visita  Tambin puede enviarnos un mensaje a travs de Clinical cytogeneticist. Por lo general respondemos a los mensajes de MyChart en el transcurso de 1 a 2 das hbiles.  Para renovar recetas, por favor pida a su farmacia que se ponga en contacto con nuestra oficina. Annie Sable de fax es [place] [phone].  Si tiene un asunto urgente cuando la clnica est cerrada y que no puede esperar hasta el siguiente da hbil, puede llamar/localizar a su doctor(a) al nmero que aparece a continuacin.   Por favor, tenga en cuenta que aunque hacemos todo lo posible para estar disponibles para asuntos urgentes fuera del horario de [place], no estamos disponibles las 24 horas del da, los 7 [address]  [address] de la [place].   Si tiene un problema urgente y no puede comunicarse con nosotros, puede optar por buscar atencin mdica  en el consultorio de su doctor(a), en una clnica privada, en un centro de atencin urgente o en una sala de emergencias.  Si tiene Engineer, drilling, por favor llame inmediatamente al 911 o vaya a la sala de emergencias.  Nmeros de bper  - Dr. Gwen Pounds: [phone]  - Dra. Moye: [phone]  - Dra. Roseanne Reno: [phone]  En caso de inclemencias del [place], por favor llame a Lacy Duverney principal al [phone] para una actualizacin sobre el [place] de cualquier retraso o  cierre.  Consejos para la medicacin en dermatologa: Por favor, guarde las cajas en las que vienen los medicamentos de uso tpico para ayudarle a seguir las instrucciones sobre dnde y cmo usarlos. Las farmacias generalmente imprimen las instrucciones del medicamento slo en las cajas y no directamente en los tubos del [place].   Si su medicamento es muy caro, por favor, pngase en contacto con Rolm Gala llamando al [phone] y presione la opcin 4 o envenos un mensaje a travs de Clinical cytogeneticist.   No podemos decirle cul ser su copago por los medicamentos por adelantado ya que esto es diferente dependiendo de la cobertura de su seguro. Sin embargo, es posible que podamos encontrar un medicamento sustituto a Audiological scientist un formulario para que el seguro cubra el medicamento que se considera necesario.   Si se requiere una autorizacin previa para que su compaa de seguros [place] su medicamento, por favor permtanos de 1 a 2 das hbiles para completar [address]  Los precios de los medicamentos varan  con frecuencia dependiendo del lugar de dnde se surte la receta y alguna farmacias pueden ofrecer precios ms baratos.  El sitio web www.goodrx.com tiene cupones para medicamentos de Airline pilot. Los precios aqu no tienen en cuenta lo que podra costar con la ayuda del seguro (puede ser ms barato con su seguro), pero el sitio web puede darle el precio si no utiliz Research scientist (physical sciences).  - Puede imprimir el cupn correspondiente y llevarlo con su receta a la farmacia.  - Tambin puede pasar por nuestra oficina durante el horario de atencin regular y Charity fundraiser una tarjeta de cupones de GoodRx.  - Si necesita que su receta se enve electrnicamente a una farmacia diferente, informe a nuestra oficina a travs de MyChart de [place] o por telfono llamando al [phone] y presione la opcin 4.

## 2022-02-14 ENCOUNTER — Telehealth: Payer: Self-pay | Admitting: *Deleted

## 2022-02-14 DIAGNOSIS — Z952 Presence of prosthetic heart valve: Secondary | ICD-10-CM

## 2022-02-14 MED ORDER — WARFARIN SODIUM 5 MG PO TABS: ORAL_TABLET | ORAL | 2 refills | Status: DC
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
## 2022-02-14 NOTE — Telephone Encounter (Signed)
Try phone number again.  Still got VM.  LM I would send in warfarin refill and mail warfarin instructions to his home address.  Pt to call back when he gets new phone if he has questions.

## 2022-02-18 ENCOUNTER — Ambulatory Visit: Payer: Managed Care, Other (non HMO)

## 2022-02-21 ENCOUNTER — Ambulatory Visit: Payer: Managed Care, Other (non HMO) | Attending: Cardiology | Admitting: *Deleted

## 2022-02-21 DIAGNOSIS — Z5181 Encounter for therapeutic drug level monitoring: Secondary | ICD-10-CM | POA: Diagnosis not present

## 2022-02-21 DIAGNOSIS — Z952 Presence of prosthetic heart valve: Secondary | ICD-10-CM

## 2022-02-21 LAB — POCT INR: INR: 1.3 — AB (ref 2.0–3.0)

## 2022-02-21 NOTE — Patient Instructions (Signed)
Amiodarone discontinued by Dr Harl Bowie 05/28/21 Took warfarin this morning. Take warfarin 2 tablets tonight then increase dose to 1 1/2 tablets daily except 2 tablets on Mondays, Wednesdays and Fridays Recheck in 1 wk

## 2022-02-28 ENCOUNTER — Ambulatory Visit: Payer: Managed Care, Other (non HMO) | Attending: Cardiology | Admitting: *Deleted

## 2022-02-28 DIAGNOSIS — Z5181 Encounter for therapeutic drug level monitoring: Secondary | ICD-10-CM

## 2022-02-28 DIAGNOSIS — Z952 Presence of prosthetic heart valve: Secondary | ICD-10-CM

## 2022-02-28 LAB — POCT INR: INR: 2.1 (ref 2.0–3.0)

## 2022-02-28 NOTE — Patient Instructions (Signed)
Amiodarone discontinued by Dr Harl Bowie 05/28/21 Took warfarin this morning. Continue warfarin 1 1/2 tablets daily except 2 tablets on Mondays, Wednesdays and Fridays Recheck in 3 wks

## 2022-03-06 ENCOUNTER — Ambulatory Visit: Payer: Managed Care, Other (non HMO) | Admitting: Dermatology

## 2022-03-21 ENCOUNTER — Ambulatory Visit: Payer: Managed Care, Other (non HMO) | Attending: Cardiology | Admitting: *Deleted

## 2022-03-21 DIAGNOSIS — Z5181 Encounter for therapeutic drug level monitoring: Secondary | ICD-10-CM | POA: Diagnosis not present

## 2022-03-21 DIAGNOSIS — Z952 Presence of prosthetic heart valve: Secondary | ICD-10-CM | POA: Diagnosis not present

## 2022-03-21 LAB — POCT INR: INR: 2.7 (ref 2.0–3.0)

## 2022-03-21 NOTE — Patient Instructions (Signed)
Amiodarone discontinued by Dr Harl Bowie 05/28/21 Pt forgot warfarin morning. (Do not take tonight) Decrease warfarin to 1 1/2 tablets daily Recheck in 3 wks

## 2022-04-11 ENCOUNTER — Ambulatory Visit: Payer: Managed Care, Other (non HMO) | Attending: Cardiology | Admitting: *Deleted

## 2022-04-11 DIAGNOSIS — Z952 Presence of prosthetic heart valve: Secondary | ICD-10-CM

## 2022-04-11 DIAGNOSIS — Z5181 Encounter for therapeutic drug level monitoring: Secondary | ICD-10-CM | POA: Diagnosis not present

## 2022-04-11 LAB — POCT INR: POC INR: 2

## 2022-04-11 NOTE — Patient Instructions (Signed)
Description   Amiodarone discontinued by Dr Harl Bowie 05/28/21 Continue to take warfarin to 1 1/2 tablets daily Recheck in 4 wks

## 2022-04-24 ENCOUNTER — Ambulatory Visit: Payer: Managed Care, Other (non HMO) | Admitting: Family Medicine

## 2022-04-25 ENCOUNTER — Encounter: Payer: Self-pay | Admitting: Family Medicine

## 2022-04-26 ENCOUNTER — Ambulatory Visit (INDEPENDENT_AMBULATORY_CARE_PROVIDER_SITE_OTHER): Payer: Managed Care, Other (non HMO) | Admitting: Family Medicine

## 2022-04-26 ENCOUNTER — Encounter: Payer: Self-pay | Admitting: Family Medicine

## 2022-04-26 VITALS — BP 122/80 | HR 73 | Ht 74.5 in | Wt 179.1 lb

## 2022-04-26 DIAGNOSIS — E7849 Other hyperlipidemia: Secondary | ICD-10-CM

## 2022-04-26 DIAGNOSIS — R21 Rash and other nonspecific skin eruption: Secondary | ICD-10-CM | POA: Diagnosis not present

## 2022-04-26 DIAGNOSIS — E0789 Other specified disorders of thyroid: Secondary | ICD-10-CM | POA: Diagnosis not present

## 2022-04-26 DIAGNOSIS — E559 Vitamin D deficiency, unspecified: Secondary | ICD-10-CM

## 2022-04-26 DIAGNOSIS — R7301 Impaired fasting glucose: Secondary | ICD-10-CM

## 2022-04-26 DIAGNOSIS — Z720 Tobacco use: Secondary | ICD-10-CM | POA: Diagnosis not present

## 2022-04-26 MED ORDER — CLOBETASOL PROPIONATE 0.05 % EX CREA: TOPICAL_CREAM | CUTANEOUS | 2 refills | Status: AC

## 2022-04-26 NOTE — Progress Notes (Signed)
Established Patient Office Visit  Subjective:  Patient ID: Isaac Diaz, male    DOB: 04/21/1965  Age: 57 y.o. MRN: [id number]  CC:  Chief Complaint  Patient presents with   Follow-up    3 month f/u, pt reports skin rash coming back needs refill.     HPI Isaac Diaz is a 57 y.o. male with past medical history of contact dermatitis and tobacco use presents for f/u of  chronic medical conditions. For the details of today's visit, please refer to the assessment and plan.     Past Medical History:  Diagnosis Date   Allergy    Aortic stenosis    a. s/p AVR using an On-X 23 mm mechanical valve on 04/04/2021   Enlarged prostate    Heart murmur     Past Surgical History:  Procedure Laterality Date   AMPUTATION FINGER Right    fingertip right long   AORTIC VALVE REPLACEMENT N/A 04/04/2021   Procedure: AORTIC VALVE REPLACEMENT (AVR) USING ON-X VALVE SIZE 23MM;  Surgeon: Melrose Nakayama, MD;  Location: [facility];  Service: Open Heart Surgery;  Laterality: N/A;  mechanical   RIGHT HEART CATH AND CORONARY ANGIOGRAPHY N/A 03/16/2021   Procedure: RIGHT HEART CATH AND CORONARY ANGIOGRAPHY;  Surgeon: Burnell Blanks, MD;  Location: [facility];  Service: Cardiovascular;  Laterality: N/A;   TEE WITHOUT CARDIOVERSION N/A 04/04/2021   Procedure: TRANSESOPHAGEAL ECHOCARDIOGRAM (TEE);  Surgeon: Melrose Nakayama, MD;  Location: [facility];  Service: Open Heart Surgery;  Laterality: N/A;   WISDOM TOOTH EXTRACTION      Family History  Problem Relation Age of Onset   Cancer Mother        stomach   Stroke Father 13   Aneurysm Sister        brain aneurysm   Cancer Maternal Grandfather        lung    Social History   Socioeconomic History   Marital status: Legally Separated    Spouse name: Not on file   Number of children: 6   Years of education: 12   Highest education level: Not on file  Occupational History   Occupation: concrete pre cast  Tobacco Use    Smoking status: Every Day    Packs/day: 0.50    Years: 28.00    Additional pack years: 0.00    Total pack years: 14.00    Types: Cigarettes    Start date: 02/12/1988   Smokeless tobacco: Never  Vaping Use   Vaping Use: Never used  Substance and Sexual Activity   Alcohol use: Yes    Alcohol/week: 14.0 standard drinks of alcohol    Types: 14 Cans of beer per week    Comment:  weekly   Drug use: Yes    Types: Marijuana    Comment: rare   Sexual activity: Not Currently    Birth control/protection: None  Other Topics Concern   Not on file  Social History Narrative   Born in [place] in [place] for 17 years   Moved back Dec 2018   Lives with Sister, Bryson Ha, Bronson Ing   Family nearby   Social Determinants of Health   Financial Resource Strain: Not on file  Food Insecurity: Not on file  Transportation Needs: Not on file  Physical Activity: Not on file  Stress: Not on file  Social Connections: Not on file  Intimate Partner Violence: Not on file    Outpatient Medications Prior to  Visit  Medication Sig Dispense Refill   aspirin EC 81 MG EC tablet Take 1 tablet (81 mg total) by mouth daily. Swallow whole. 30 tablet 11   metoprolol succinate (TOPROL XL) 25 MG 24 hr tablet Take 0.5 tablets (12.5 mg total) by mouth daily. 45 tablet 3   warfarin (COUMADIN) 5 MG tablet TAKE  11/2 TO 2 TABLETS BY MOUTH DAILY AS DIRECTED BY COUMADIN CLINIC 60 tablet 2   triamcinolone cream (KENALOG) 0.1 % Apply 1 Application topically 2 (two) times daily. (Patient not taking: Reported on 04/26/2022) 28.4 g 0   clobetasol cream (TEMOVATE) 0.05 % Apply twice daily until clear. Avoid applying to face, groin, and axilla. (Patient not taking: Reported on 04/26/2022) 60 g 2   Facility-Administered Medications Prior to Visit  Medication Dose Route Frequency Provider Last Rate Last Admin   sodium chloride flush (NS) 0.9 % injection 3 mL  3 mL Intravenous Q12H Branch, Alphonse Guild, MD        Allergies   Allergen Reactions   Other Anaphylaxis    Feathers (pillow stuffing)   Chocolate Hives    ROS Review of Systems  Constitutional:  Negative for fatigue and fever.  Eyes:  Negative for visual disturbance.  Respiratory:  Negative for chest tightness and shortness of breath.   Cardiovascular:  Negative for chest pain and palpitations.  Skin:  Positive for rash.  Neurological:  Negative for dizziness and headaches.      Objective:    Physical Exam Skin:    Findings: Rash present.     BP 122/80 (BP Location: Left Arm)   Pulse 73   Ht 6' 2.5" (1.892 m)   Wt 179 lb 1.9 oz (81.2 kg)   SpO2 97%   BMI 22.69 kg/m  Wt Readings from Last 3 Encounters:  04/26/22 179 lb 1.9 oz (81.2 kg)  01/22/22 181 lb 0.6 oz (82.1 kg)  01/02/22 180 lb (81.6 kg)    Lab Results  Component Value Date   TSH 0.676 12/13/2021   Lab Results  Component Value Date   WBC 7.2 12/13/2021   HGB 15.1 12/13/2021   HCT 43.6 12/13/2021   MCV 95.4 12/13/2021   PLT 184 12/13/2021   Lab Results  Component Value Date   NA 137 12/13/2021   K 3.8 12/13/2021   CO2 21 (L) 12/13/2021   GLUCOSE 82 12/13/2021   BUN 9 12/13/2021   CREATININE 0.76 12/13/2021   BILITOT 1.8 (H) 12/13/2021   ALKPHOS 62 12/13/2021   AST 22 12/13/2021   ALT 16 12/13/2021   PROT 7.5 12/13/2021   ALBUMIN 4.0 12/13/2021   CALCIUM 9.3 12/13/2021   ANIONGAP 9 12/13/2021   Lab Results  Component Value Date   CHOL 175 12/13/2021   Lab Results  Component Value Date   HDL 48 12/13/2021   Lab Results  Component Value Date   LDLCALC 113 (H) 12/13/2021   Lab Results  Component Value Date   TRIG 72 12/13/2021   Lab Results  Component Value Date   CHOLHDL 3.6 12/13/2021   Lab Results  Component Value Date   HGBA1C 5.6 12/13/2021      Assessment & Plan:  Skin rash Assessment & Plan: Contact dermatitis rash noted on the arms from chemical use at his work No complaints or concerns voiced today The patient would like  a refill of his topical corticosteroid Refilled topical corticosteroid temovate 0.05% cream   Orders: -     Clobetasol Propionate; Apply  twice daily until clear. Avoid applying to face, groin, and axilla.  Dispense: 60 g; Refill: 2  Tobacco abuse Assessment & Plan: Smokes about 1 pack/day  Asked about quitting: confirms that he currently smokes cigarettes Advise to quit smoking: Educated about QUITTING to reduce the risk of cancer, cardio and cerebrovascular disease. Assess willingness: Unwilling to quit at this time, but is working on cutting back. Assist with counseling and pharmacotherapy: Counseled for 5 minutes and literature provided. Arrange for follow up: follow up in 3 months and continue to offer help.    Reviewed: Smoking is harmful to your health and increases your risk for cancer, COPD, high blood pressure, cataracts, digestive problems, or health problems , such as gum disease, mouth sores, and tooth loss and loss of taste and smell. Smoking irritates your throat and causes coughing.     Vitamin D deficiency -     VITAMIN D 25 Hydroxy (Vit-D Deficiency, Fractures)  Other specified disorders of thyroid -     TSH  Other hyperlipidemia -     CBC with Differential/Platelet -     CMP14+EGFR -     Lipid panel  Impaired fasting blood sugar -     Hemoglobin A1c    Follow-up: Return in about 3 months (around 07/27/2022).   Alvira Monday, FNP

## 2022-04-26 NOTE — Assessment & Plan Note (Addendum)
Smokes about 1 pack/day  Asked about quitting: confirms that he currently smokes cigarettes Advise to quit smoking: Educated about QUITTING to reduce the risk of cancer, cardio and cerebrovascular disease. Assess willingness: Unwilling to quit at this time, but is working on cutting back. Assist with counseling and pharmacotherapy: Counseled for 5 minutes and literature provided. Arrange for follow up: follow up in 3 months and continue to offer help.    Reviewed: Smoking is harmful to your health and increases your risk for cancer, COPD, high blood pressure, cataracts, digestive problems, or health problems , such as gum disease, mouth sores, and tooth loss and loss of taste and smell. Smoking irritates your throat and causes coughing.

## 2022-04-26 NOTE — Assessment & Plan Note (Signed)
Contact dermatitis rash noted on the arms from chemical use at his work No complaints or concerns voiced today The patient would like a refill of his topical corticosteroid Refilled topical corticosteroid temovate 0.05% cream

## 2022-04-26 NOTE — Patient Instructions (Addendum)
I  appreciate the opportunity to provide care to you today!  Follow up:  3 months  Labs: please stop by the lab during the week to get your blood drawn (CBC, CMP, TSH, Lipid profile, HgA1c, Vit D)  Please pick up your refills at the pharmacy  Please complete your FIT Test for Colon Cancer Screening, drop it off at the lab.    Please continue to a heart-healthy diet and increase your physical activities. Try to exercise for 73mins at least five days a week.      It was a pleasure to see you and I look forward to continuing to work together on your health and well-being. Please do not hesitate to call the office if you need care or have questions about your care.   Have a wonderful day and week. With Gratitude, Alvira Monday MSN, FNP-BC

## 2022-05-09 ENCOUNTER — Ambulatory Visit: Payer: Managed Care, Other (non HMO) | Attending: Cardiology | Admitting: *Deleted

## 2022-05-09 DIAGNOSIS — Z5181 Encounter for therapeutic drug level monitoring: Secondary | ICD-10-CM

## 2022-05-09 DIAGNOSIS — Z952 Presence of prosthetic heart valve: Secondary | ICD-10-CM

## 2022-05-09 LAB — POCT INR: INR: 1.4 — AB (ref 2.0–3.0)

## 2022-05-09 NOTE — Patient Instructions (Signed)
Amiodarone discontinued by Dr Harl Bowie 05/28/21 Take warfarin 2 tablets today then resume 1 1/2 tablets daily Recheck in 3 wks

## 2022-05-21 ENCOUNTER — Other Ambulatory Visit: Payer: Self-pay | Admitting: Cardiology

## 2022-05-21 DIAGNOSIS — Z952 Presence of prosthetic heart valve: Secondary | ICD-10-CM

## 2022-05-21 NOTE — Telephone Encounter (Signed)
Refill request for warfarin:  Last INR was 1.4 on 05/09/22 Next INR due 05/30/22 Last OV was 12/13/21  Dominga Ferry MD  Refill approved.

## 2022-05-30 ENCOUNTER — Ambulatory Visit: Payer: Managed Care, Other (non HMO) | Attending: Cardiology | Admitting: *Deleted

## 2022-05-30 DIAGNOSIS — Z5181 Encounter for therapeutic drug level monitoring: Secondary | ICD-10-CM

## 2022-05-30 DIAGNOSIS — Z952 Presence of prosthetic heart valve: Secondary | ICD-10-CM

## 2022-05-30 LAB — POCT INR: INR: 1.5 — AB (ref 2.0–3.0)

## 2022-05-30 NOTE — Patient Instructions (Signed)
Amiodarone discontinued by Dr Wyline Mood 05/28/21 Take warfarin extra 1/2 tablet today then resume 1 1/2 tablets daily Recheck in 4 wks

## 2022-06-26 ENCOUNTER — Ambulatory Visit (INDEPENDENT_AMBULATORY_CARE_PROVIDER_SITE_OTHER): Payer: Managed Care, Other (non HMO) | Admitting: Internal Medicine

## 2022-06-26 ENCOUNTER — Encounter: Payer: Self-pay | Admitting: Internal Medicine

## 2022-06-26 VITALS — BP 136/72 | HR 78 | Ht 74.0 in | Wt 178.4 lb

## 2022-06-26 DIAGNOSIS — H66002 Acute suppurative otitis media without spontaneous rupture of ear drum, left ear: Secondary | ICD-10-CM | POA: Insufficient documentation

## 2022-06-26 DIAGNOSIS — H6122 Impacted cerumen, left ear: Secondary | ICD-10-CM

## 2022-06-26 MED ORDER — AMOXICILLIN-POT CLAVULANATE 875-125 MG PO TABS: 1.0000 | ORAL_TABLET | Freq: Two times a day (BID) | ORAL | 0 refills | Status: DC

## 2022-06-26 NOTE — Patient Instructions (Signed)
Please start taking Augmentin as prescribed.  You are being referred to ENT specialist for impacted ear wax.

## 2022-06-26 NOTE — Progress Notes (Signed)
Acute Office Visit  Subjective:    Patient ID: Isaac Diaz, male    DOB: 05-10-1965, 57 y.o.   MRN: [id number]  Chief Complaint  Patient presents with   Ear Fullness    Patient has ear fullness, feels and hears slush ing sounds. He feels he is off balanced    HPI Patient is in today for complaint of left ear fullness, decreased hearing and balance problem for the last 2 weeks.  Denies any ear discharge, but has ear pain upon touch.  Denies any fever or chills.  He has tried using peroxide eardrops without much relief.  Denies using any sharp objects for cleaning purposes.  Past Medical History:  Diagnosis Date   Allergy    Aortic stenosis    a. s/p AVR using an On-X 23 mm mechanical valve on 04/04/2021   Enlarged prostate    Heart murmur     Past Surgical History:  Procedure Laterality Date   AMPUTATION FINGER Right    fingertip right long   AORTIC VALVE REPLACEMENT N/A 04/04/2021   Procedure: AORTIC VALVE REPLACEMENT (AVR) USING ON-X VALVE SIZE ;  Surgeon: Loreli Slot, MD;  Location: [facility] OR;  Service: Open Heart Surgery;  Laterality: N/A;  mechanical   RIGHT HEART CATH AND CORONARY ANGIOGRAPHY N/A 03/16/2021   Procedure: RIGHT HEART CATH AND CORONARY ANGIOGRAPHY;  Surgeon: Kathleene Hazel, MD;  Location: [facility];  Service: Cardiovascular;  Laterality: N/A;   TEE WITHOUT CARDIOVERSION N/A 04/04/2021   Procedure: TRANSESOPHAGEAL ECHOCARDIOGRAM (TEE);  Surgeon: Loreli Slot, MD;  Location: [facility] OR;  Service: Open Heart Surgery;  Laterality: N/A;   WISDOM TOOTH EXTRACTION      Family History  Problem Relation Age of Onset   Cancer Mother        stomach   Stroke Father 37   Aneurysm Sister        brain aneurysm   Cancer Maternal Grandfather        lung    Social History   Socioeconomic History   Marital status: Legally Separated    Spouse name: Not on file   Number of children: 6   Years of education: 12   Highest  education level: Not on file  Occupational History   Occupation: concrete pre cast  Tobacco Use   Smoking status: Every Day    Packs/day: 0.50    Years: 28.00    Additional pack years: 0.00    Total pack years: 14.00    Types: Cigarettes    Start date: 02/12/1988   Smokeless tobacco: Never  Vaping Use   Vaping Use: Never used  Substance and Sexual Activity   Alcohol use: Yes    Alcohol/week: 14.0 standard drinks of alcohol    Types: 14 Cans of beer per week    Comment:  weekly   Drug use: Yes    Types: Marijuana    Comment: rare   Sexual activity: Not Currently    Birth control/protection: None  Other Topics Concern   Not on file  Social History Narrative   Born in [place]   Worked in [place] for 17 years   Moved back Dec 2018   Lives with Sister, [employer]   Family nearby   Social Determinants of Health   Financial Resource Strain: Not on file  Food Insecurity: Not on file  Transportation Needs: Not on file  Physical Activity: Not on file  Stress: Not on file  Social  Connections: Not on file  Intimate Partner Violence: Not on file    Outpatient Medications Prior to Visit  Medication Sig Dispense Refill   aspirin EC 81 MG EC tablet Take 1 tablet (81 mg total) by mouth daily. Swallow whole. 30 tablet 11   clobetasol cream (TEMOVATE) 0.05 % Apply twice daily until clear. Avoid applying to face, groin, and axilla. 60 g 2   metoprolol succinate (TOPROL XL) 25 MG 24 hr tablet Take 0.5 tablets (12.5 mg total) by mouth daily. 45 tablet 3   triamcinolone cream (KENALOG) 0.1 % Apply 1 Application topically 2 (two) times daily. (Patient not taking: Reported on 04/26/2022) 28.4 g 0   warfarin (COUMADIN) 5 MG tablet TAKE 1 AND 1/2-2 TABLETS DAILY AS DIRECTED BY COUMADIN CLINIC 60 tablet 2   Facility-Administered Medications Prior to Visit  Medication Dose Route Frequency Provider Last Rate Last Admin   sodium chloride flush (NS) 0.9 % injection 3 mL  3 mL Intravenous  Q12H Branch, Dorothe Pea, MD        Allergies  Allergen Reactions   Other Anaphylaxis    Feathers (pillow stuffing)   Chocolate Hives    Review of Systems  Constitutional:  Negative for chills and fever.  HENT:  Positive for ear pain. Negative for congestion, ear discharge and sore throat.        Left ear decreased hearing  Eyes:  Negative for pain and discharge.  Respiratory:  Negative for cough and shortness of breath.   Cardiovascular:  Negative for chest pain and palpitations.  Endocrine: Negative for polydipsia and polyuria.  Genitourinary:  Negative for dysuria and hematuria.  Musculoskeletal:  Negative for neck pain and neck stiffness.  Skin:  Negative for rash.  Neurological:  Positive for dizziness. Negative for weakness.  Psychiatric/Behavioral:  Negative for agitation and behavioral problems.        Objective:    Physical Exam Vitals reviewed.  Constitutional:      General: He is not in acute distress.    Appearance: He is not diaphoretic.  HENT:     Head: Atraumatic.     Left Ear: Tenderness present. There is impacted cerumen.     Nose: Nose normal.     Mouth/Throat:     Mouth: Mucous membranes are moist.  Eyes:     General: No scleral icterus.    Extraocular Movements: Extraocular movements intact.  Cardiovascular:     Rate and Rhythm: Normal rate and regular rhythm.     Heart sounds: Normal heart sounds. No murmur heard. Pulmonary:     Breath sounds: Normal breath sounds. No wheezing or rales.  Musculoskeletal:     Cervical back: Neck supple. No tenderness.     Right lower leg: No edema.     Left lower leg: No edema.  Skin:    General: Skin is warm.     Findings: No rash.  Neurological:     General: No focal deficit present.     Mental Status: He is alert and oriented to person, place, and time.  Psychiatric:        Mood and Affect: Mood normal.        Behavior: Behavior normal.     BP (!) 147/73   Pulse 78   Ht 6\' 2"  (1.88 m)   Wt 178 lb  6.4 oz (80.9 kg)   SpO2 98%   BMI 22.91 kg/m  Wt Readings from Last 3 Encounters:  06/26/22 178 lb 6.4 oz (80.9  kg)  04/26/22 179 lb 1.9 oz (81.2 kg)  01/22/22 181 lb 0.6 oz (82.1 kg)        Assessment & Plan:   Problem List Items Addressed This Visit       Nervous and Auditory   Non-recurrent acute suppurative otitis media of left ear without spontaneous rupture of tympanic membrane - Primary    Has tenderness and mild erythema in the left ear canal Started Augmentin Referred to ENT as he still has impacted earwax      Relevant Medications   amoxicillin-clavulanate (AUGMENTIN) 875-125 MG tablet   Other Relevant Orders   Ambulatory referral to ENT   Impacted cerumen of left ear    Left ear irrigation done today, but still has leftover impacted earwax He has tried Debrox eardrops Referred to ENT specialist Advised to avoid using any sharp objects for cleaning purposes      Relevant Orders   Ambulatory referral to ENT     Meds ordered this encounter  Medications   amoxicillin-clavulanate (AUGMENTIN) 875-125 MG tablet    Sig: Take 1 tablet by mouth 2 (two) times daily.    Dispense:  14 tablet    Refill:  0     Anees Vanecek Concha Se, MD

## 2022-06-26 NOTE — Assessment & Plan Note (Signed)
Has tenderness and mild erythema in the left ear canal Started Augmentin Referred to ENT as he still has impacted earwax

## 2022-06-26 NOTE — Addendum Note (Signed)
Addended byTrena Platt on: 06/26/2022 02:26 PM   Modules accepted: Level of Service

## 2022-06-26 NOTE — Assessment & Plan Note (Addendum)
Left ear irrigation done today, but still has leftover impacted earwax He has tried Debrox eardrops Referred to ENT specialist Advised to avoid using any sharp objects for cleaning purposes

## 2022-06-27 ENCOUNTER — Other Ambulatory Visit: Payer: Self-pay | Admitting: Family Medicine

## 2022-06-27 DIAGNOSIS — E7849 Other hyperlipidemia: Secondary | ICD-10-CM

## 2022-06-27 DIAGNOSIS — E559 Vitamin D deficiency, unspecified: Secondary | ICD-10-CM

## 2022-06-27 LAB — CBC WITH DIFFERENTIAL/PLATELET
Basophils Absolute: 0 10*3/uL (ref 0.0–0.2)
Basos: 0 %
EOS (ABSOLUTE): 0.1 10*3/uL (ref 0.0–0.4)
Eos: 2 %
Hematocrit: 42.3 % (ref 37.5–51.0)
Hemoglobin: 14.6 g/dL (ref 13.0–17.7)
Immature Grans (Abs): 0 10*3/uL (ref 0.0–0.1)
Immature Granulocytes: 0 %
Lymphocytes Absolute: 1.8 10*3/uL (ref 0.7–3.1)
Lymphs: 35 %
MCH: 32.7 pg (ref 26.6–33.0)
MCHC: 34.5 g/dL (ref 31.5–35.7)
MCV: 95 fL (ref 79–97)
Monocytes Absolute: 0.5 10*3/uL (ref 0.1–0.9)
Monocytes: 9 %
Neutrophils Absolute: 2.9 10*3/uL (ref 1.4–7.0)
Neutrophils: 54 %
Platelets: 174 10*3/uL (ref 150–450)
RBC: 4.47 x10E6/uL (ref 4.14–5.80)
RDW: 13.5 % (ref 11.6–15.4)
WBC: 5.3 10*3/uL (ref 3.4–10.8)

## 2022-06-27 LAB — CMP14+EGFR
ALT: 22 IU/L (ref 0–44)
AST: 29 IU/L (ref 0–40)
Albumin/Globulin Ratio: 1.5 (ref 1.2–2.2)
Albumin: 4.3 g/dL (ref 3.8–4.9)
Alkaline Phosphatase: 76 IU/L (ref 44–121)
BUN/Creatinine Ratio: 10 (ref 9–20)
BUN: 9 mg/dL (ref 6–24)
Bilirubin Total: 0.9 mg/dL (ref 0.0–1.2)
CO2: 20 mmol/L (ref 20–29)
Calcium: 9.3 mg/dL (ref 8.7–10.2)
Chloride: 105 mmol/L (ref 96–106)
Creatinine, Ser: 0.86 mg/dL (ref 0.76–1.27)
Globulin, Total: 2.9 g/dL (ref 1.5–4.5)
Glucose: 108 mg/dL — ABNORMAL HIGH (ref 70–99)
Potassium: 4 mmol/L (ref 3.5–5.2)
Sodium: 139 mmol/L (ref 134–144)
Total Protein: 7.2 g/dL (ref 6.0–8.5)
eGFR: 100 mL/min/{1.73_m2} (ref >59)

## 2022-06-27 LAB — LIPID PANEL
Chol/HDL Ratio: 4.7 ratio (ref 0.0–5.0)
Cholesterol, Total: 193 mg/dL (ref 100–199)
HDL: 41 mg/dL (ref >39)
LDL Chol Calc (NIH): 117 mg/dL — ABNORMAL HIGH (ref 0–99)
Triglycerides: 199 mg/dL — ABNORMAL HIGH (ref 0–149)
VLDL Cholesterol Cal: 35 mg/dL (ref 5–40)

## 2022-06-27 LAB — VITAMIN D 25 HYDROXY (VIT D DEFICIENCY, FRACTURES): Vit D, 25-Hydroxy: 9.8 ng/mL — ABNORMAL LOW (ref 30.0–100.0)

## 2022-06-27 LAB — HEMOGLOBIN A1C
Est. average glucose Bld gHb Est-mCnc: 120 mg/dL
Hgb A1c MFr Bld: 5.8 % — ABNORMAL HIGH (ref 4.8–5.6)

## 2022-06-27 LAB — TSH: TSH: 0.88 u[IU]/mL (ref 0.450–4.500)

## 2022-06-27 MED ORDER — VITAMIN D (ERGOCALCIFEROL) 1.25 MG (50000 UNIT) PO CAPS: 50000.0000 [IU] | ORAL_CAPSULE | ORAL | 1 refills | Status: DC

## 2022-06-27 MED ORDER — ROSUVASTATIN CALCIUM 10 MG PO TABS: 10.0000 mg | ORAL_TABLET | Freq: Every day | ORAL | 3 refills | Status: DC

## 2022-06-27 NOTE — Progress Notes (Unsigned)
erg ?

## 2022-06-27 NOTE — Progress Notes (Signed)
A weekly vitamin D supplement prescription has been sent to your pharmacy because your vitamin D is low. Your cholesterol levels are elevated. I want your LDL to be less than 100. I recommend avoiding simple carbohydrates including cakes, sweet desserts, ice cream, soda (diet or regular), sweet tea, candies, chips, cookies, store-bought juices, alcohol in excess of 1-2 drinks a day, lemonade, artificial sweeteners, donuts, coffee creamers, and sugar-free products.  I recommend avoiding greasy, fatty foods with increased physical activity. A prescription for rosuvastatin 10 mg has been sent to your pharmacy to help decrease your cholesterol levels. You are prediabetic, I recommend decreasing your intake of foods high in sugar. Your thyroid, kidneys, and liver function are stable.   

## 2022-06-27 NOTE — Progress Notes (Signed)
The 10-year ASCVD risk score (Arnett DK, et al., 2019) is: 23.7%   Values used to calculate the score:     Age: 57 years     Sex: Male     Is Non-Hispanic African American: Yes     Diabetic: No     Tobacco smoker: Yes     Systolic Blood Pressure: 136 mmHg     Is BP treated: Yes     HDL Cholesterol: 41 mg/dL     Total Cholesterol: 193 mg/dL

## 2022-07-01 ENCOUNTER — Telehealth: Payer: Self-pay | Admitting: Family Medicine

## 2022-07-01 ENCOUNTER — Other Ambulatory Visit: Payer: Self-pay | Admitting: Family Medicine

## 2022-07-01 NOTE — Telephone Encounter (Signed)
Pt came by the office, hard copy printed and given to pt.

## 2022-07-01 NOTE — Telephone Encounter (Signed)
Patient said he was returning lab result phone call to nurse.

## 2022-07-18 ENCOUNTER — Ambulatory Visit: Payer: Managed Care, Other (non HMO) | Attending: Internal Medicine | Admitting: *Deleted

## 2022-07-18 DIAGNOSIS — Z5181 Encounter for therapeutic drug level monitoring: Secondary | ICD-10-CM | POA: Diagnosis not present

## 2022-07-18 DIAGNOSIS — Z952 Presence of prosthetic heart valve: Secondary | ICD-10-CM

## 2022-07-18 LAB — POCT INR: INR: 2.5 (ref 2.0–3.0)

## 2022-07-18 MED ORDER — WARFARIN SODIUM 5 MG PO TABS
ORAL_TABLET | ORAL | 4 refills | Status: DC
Start: 2022-07-18 — End: 2023-02-17

## 2022-07-18 NOTE — Patient Instructions (Signed)
Amiodarone discontinued by Dr Wyline Mood 05/28/21 Take warfarin 1/2 tablet tomorrow then resume 1 1/2 tablets daily Recheck in 4 wks

## 2022-07-24 ENCOUNTER — Ambulatory Visit: Payer: Managed Care, Other (non HMO) | Admitting: Family Medicine

## 2022-07-26 ENCOUNTER — Ambulatory Visit: Payer: Managed Care, Other (non HMO) | Admitting: Family Medicine

## 2022-12-04 ENCOUNTER — Ambulatory Visit: Payer: Managed Care, Other (non HMO) | Attending: Cardiology | Admitting: Cardiology

## 2022-12-04 ENCOUNTER — Encounter: Payer: Self-pay | Admitting: Cardiology

## 2022-12-04 VITALS — BP 168/84 | HR 68 | Ht 74.0 in | Wt 171.6 lb

## 2022-12-04 DIAGNOSIS — E782 Mixed hyperlipidemia: Secondary | ICD-10-CM | POA: Diagnosis not present

## 2022-12-04 DIAGNOSIS — R030 Elevated blood-pressure reading, without diagnosis of hypertension: Secondary | ICD-10-CM | POA: Diagnosis not present

## 2022-12-04 DIAGNOSIS — Z952 Presence of prosthetic heart valve: Secondary | ICD-10-CM | POA: Diagnosis not present

## 2022-12-04 NOTE — Patient Instructions (Signed)
Medication Instructions:  Your physician recommends that you continue on your current medications as directed. Please refer to the Current Medication list given to you today.  *If you need a refill on your cardiac medications before your next appointment, please call your pharmacy*   Lab Work: None If you have labs (blood work) drawn today and your tests are completely normal, you will receive your results only by: MyChart Message (if you have MyChart) OR A paper copy in the mail If you have any lab test that is abnormal or we need to change your treatment, we will call you to review the results.   Testing/Procedures: None   Follow-Up: At [facility], you and your health needs are our priority.  As part of our continuing mission to provide you with exceptional heart care, we have created designated Provider Care Teams.  These Care Teams include your primary Cardiologist (physician) and Advanced Practice Providers (APPs -  Physician Assistants and Nurse Practitioners) who all work together to provide you with the care you need, when you need it.  We recommend signing up for the patient portal called "MyChart".  Sign up information is provided on this After Visit Summary.  MyChart is used to connect with patients for Virtual Visits (Telemedicine).  Patients are able to view lab/test results, encounter notes, upcoming appointments, etc.  Non-urgent messages can be sent to your provider as well.   To learn more about what you can do with MyChart, go to [email or web address].    Your next appointment:   1 year(s)  Provider:   You may see Dina Rich, MD or one of the following Advanced Practice Providers on your designated Care Team:   Randall An, PA-C  Jacolyn Reedy, [place]     Other Instructions Nurse Visit- 1 week for bp check- please bring all medication bottles with you!

## 2022-12-04 NOTE — Progress Notes (Signed)
Clinical Summary Isaac Diaz is a 57 y.o.male seen today for follow up of the following medical problems   1. Aortic stenosis/Bicuspid AV - echo 06/2016 LVEF 60-65%, normal diastolic function, moderate AS mean grad 32, AVA VTI 1.46, mod AI, mild MR  2018 CTA no aortopathy  04/2019 echo LVEF 60-65%, bicuspid AV, mod AS AVA VTI 1.46 mean grad 33 DI 0.35.     11/2020 echo: LVEF  60-65%. AVA VTI 0.94 mean grad 43 DI 0.30   - 04/05/2021 23 mm On-X mechanical valve (reference ONXANE, serial number [id number] placed   05/2021 LVEF 60-65%, normal AVR - no recent SOB/DOE, no LE edema.  -no bledding on coumadin.      2.Postop afib - after AVR surgery, started on amiodarone and coumadin - discharged on oral amio, was lowered to 200mg  daily at 05/01/21 f/u  - no recent palpitations. - has not had a recurrence.   3. Erectile dysfunction - reports recent issues. He is not sure if he is taking toprol or now        - SH: son with [employer] of fallot with prior surgery Works  concrete/construction Past Medical History:  Diagnosis Date   Allergy    Aortic stenosis    a. s/p AVR using an On-X 23 mm mechanical valve on 04/04/2021   Enlarged prostate    Heart murmur      Allergies  Allergen Reactions   Other Anaphylaxis    Feathers (pillow stuffing)   Chocolate Hives     Current Outpatient Medications  Medication Sig Dispense Refill   Vitamin D, Ergocalciferol, (DRISDOL) 1.25 MG (50000 UNIT) CAPS capsule Take 1 capsule (50,000 Units total) by mouth every 7 (seven) days. 20 capsule 1   amoxicillin-clavulanate (AUGMENTIN) 875-125 MG tablet Take 1 tablet by mouth 2 (two) times daily. 14 tablet 0   aspirin EC 81 MG EC tablet Take 1 tablet (81 mg total) by mouth daily. Swallow whole. 30 tablet 11   clobetasol cream (TEMOVATE) 0.05 % Apply twice daily until clear. Avoid applying to face, groin, and axilla. 60 g 2   metoprolol succinate (TOPROL XL) 25 MG 24 hr tablet Take 0.5 tablets  (12.5 mg total) by mouth daily. 45 tablet 3   rosuvastatin (CRESTOR) 10 MG tablet Take 1 tablet (10 mg total) by mouth daily. 90 tablet 3   triamcinolone cream (KENALOG) 0.1 % Apply 1 Application topically 2 (two) times daily. (Patient not taking: Reported on 04/26/2022) 28.4 g 0   warfarin (COUMADIN) 5 MG tablet TAKE 1 AND 1/2-2 TABLETS DAILY AS DIRECTED BY COUMADIN CLINIC 60 tablet 4   Current Facility-Administered Medications  Medication Dose Route Frequency Provider Last Rate Last Admin   sodium chloride flush (NS) 0.9 % injection 3 mL  3 mL Intravenous Q12H Antoine Poche, MD         Past Surgical History:  Procedure Laterality Date   AMPUTATION FINGER Right    fingertip right long   AORTIC VALVE REPLACEMENT N/A 04/04/2021   Procedure: AORTIC VALVE REPLACEMENT (AVR) USING ON-X VALVE SIZE ;  Surgeon: Loreli Slot, MD;  Location: [facility] OR;  Service: Open Heart Surgery;  Laterality: N/A;  mechanical   RIGHT HEART CATH AND CORONARY ANGIOGRAPHY N/A 03/16/2021   Procedure: RIGHT HEART CATH AND CORONARY ANGIOGRAPHY;  Surgeon: Kathleene Hazel, MD;  Location: [facility];  Service: Cardiovascular;  Laterality: N/A;   TEE WITHOUT CARDIOVERSION N/A 04/04/2021   Procedure: TRANSESOPHAGEAL  ECHOCARDIOGRAM (TEE);  Surgeon: Loreli Slot, MD;  Location: [facility] OR;  Service: Open Heart Surgery;  Laterality: N/A;   WISDOM TOOTH EXTRACTION       Allergies  Allergen Reactions   Other Anaphylaxis    Feathers (pillow stuffing)   Chocolate Hives      Family History  Problem Relation Age of Onset   Cancer Mother        stomach   Stroke Father 38   Aneurysm Sister        brain aneurysm   Cancer Maternal Grandfather        lung     Social History Isaac Diaz reports that he has been smoking cigarettes. He started smoking about 34 years ago. He has a 17.4 pack-year smoking history. He has never used smokeless tobacco. Isaac Diaz reports current alcohol use of  about 14.0 standard drinks of alcohol per week.   Review of Systems CONSTITUTIONAL: No weight loss, fever, chills, weakness or fatigue.  HEENT: Eyes: No visual loss, blurred vision, double vision or yellow sclerae.No hearing loss, sneezing, congestion, runny nose or sore throat.  SKIN: No rash or itching.  CARDIOVASCULAR: per hpi RESPIRATORY: No shortness of breath, cough or sputum.  GASTROINTESTINAL: No anorexia, nausea, vomiting or diarrhea. No abdominal pain or blood.  GENITOURINARY: No burning on urination, no polyuria NEUROLOGICAL: No headache, dizziness, syncope, paralysis, ataxia, numbness or tingling in the extremities. No change in bowel or bladder control.  MUSCULOSKELETAL: No muscle, back pain, joint pain or stiffness.  LYMPHATICS: No enlarged nodes. No history of splenectomy.  PSYCHIATRIC: No history of depression or anxiety.  ENDOCRINOLOGIC: No reports of sweating, cold or heat intolerance. No polyuria or polydipsia.  Marland Kitchen   Physical Examination Today's Vitals   12/04/22 1551 12/04/22 1603  BP: (!) 168/82 (!) 168/84  Pulse: 68   SpO2: 96%   Weight: 171 lb 9.6 oz (77.8 kg)   Height: 6\' 2"  (1.88 m)    Body mass index is 22.03 kg/m.  Gen: resting comfortably, no acute distress HEENT: no scleral icterus, pupils equal round and reactive, no palptable cervical adenopathy,  CV: RRR, mechanical S2 Resp: Clear to auscultation bilaterally GI: abdomen is soft, non-tender, non-distended, normal bowel sounds, no hepatosplenomegaly MSK: extremities are warm, no edema.  Skin: warm, no rash Neuro:  no focal deficits Psych: appropriate affect   Diagnostic Studies 11/2020 echo  1. AoV appears bicuspid with fusion of the RCC/NCC. There is severe  aortic stenosis. Vmax 4.4 m/s, MG 43 mmHG, AVA 0.94 cm2, DI 0.30. Due to  mild to moderate AI, DI is inaccurate and AVA is overestimated (LVOT VTI  31 cm). Findings are still consistent  with severe AS and mild to moderate AI. The  aortic valve is bicuspid.  Aortic valve regurgitation is mild to moderate. Severe aortic valve  stenosis. Aortic valve area, by VTI measures 0.94 cm. Aortic valve mean  gradient measures 43.0 mmHg. Aortic valve  Vmax measures 4.45 m/s.   2. Left ventricular ejection fraction, by estimation, is 60 to 65%. The  left ventricle has normal function. The left ventricle has no regional  wall motion abnormalities. There is mild concentric left ventricular  hypertrophy. Left ventricular diastolic  parameters were normal.   3. Right ventricular systolic function is normal. The right ventricular  size is normal. Tricuspid regurgitation signal is inadequate for assessing  PA pressure.   4. Left atrial size was mildly dilated.   5. The mitral valve is grossly normal.  No evidence of mitral valve  regurgitation. No evidence of mitral stenosis.   6. The inferior vena cava is normal in size with greater than 50%  respiratory variability, suggesting right atrial pressure of 3 mmHg.      03/2021 RHC/LHC No angiographic evidence of CAD Normal right heart pressures Severe aortic stenosis by echo. I did not cross the aortic valve today.    Recommendations: Continue planning for AVR. He is likely going to be best treated with surgical AVR. I will make a referral to CT surgery to discuss this.         Assessment and Plan   Severe aortic stenosis s/p mechanical AVR -no symptoms, continue to monitor. Contniue coumadin, ASA     2.Elevated blood pressure - first high bp in clinic, come back for bp check 1 weeks. If elevated then start norvasc 5mg  daily  3. Erectile dysfunction - he will bring pill bottles with him next week, he is unclear exactly what he has. If on toprol could try stopping  4. HLD - if does not have statin with his pill bottles next week will need to restart.      Antoine Poche, M.D.

## 2022-12-10 ENCOUNTER — Encounter: Payer: Self-pay | Admitting: Family Medicine

## 2022-12-10 ENCOUNTER — Ambulatory Visit (INDEPENDENT_AMBULATORY_CARE_PROVIDER_SITE_OTHER): Payer: Managed Care, Other (non HMO) | Admitting: Family Medicine

## 2022-12-10 VITALS — BP 138/86 | HR 73 | Wt 176.1 lb

## 2022-12-10 DIAGNOSIS — R030 Elevated blood-pressure reading, without diagnosis of hypertension: Secondary | ICD-10-CM | POA: Diagnosis not present

## 2022-12-10 DIAGNOSIS — Z1211 Encounter for screening for malignant neoplasm of colon: Secondary | ICD-10-CM | POA: Diagnosis not present

## 2022-12-10 DIAGNOSIS — Z1159 Encounter for screening for other viral diseases: Secondary | ICD-10-CM

## 2022-12-10 NOTE — Progress Notes (Unsigned)
Established Patient Office Visit  Subjective:  Patient ID: Isaac Diaz, male    DOB: 1965-06-29  Age: 57 y.o. MRN: [id number]  CC:  Chief Complaint  Patient presents with   Care Management    Follow up was told to f/u by cardiology due to elevated bp.     HPI Isaac Diaz is a 57 y.o. male with past medical history of *** presents for f/u of *** chronic medical conditions.  Past Medical History:  Diagnosis Date   Allergy    Aortic stenosis    a. s/p AVR using an On-X 23 mm mechanical valve on 04/04/2021   Enlarged prostate    Heart murmur     Past Surgical History:  Procedure Laterality Date   AMPUTATION FINGER Right    fingertip right long   AORTIC VALVE REPLACEMENT N/A 04/04/2021   Procedure: AORTIC VALVE REPLACEMENT (AVR) USING ON-X VALVE SIZE ;  Surgeon: Loreli Slot, MD;  Location: [facility] OR;  Service: Open Heart Surgery;  Laterality: N/A;  mechanical   RIGHT HEART CATH AND CORONARY ANGIOGRAPHY N/A 03/16/2021   Procedure: RIGHT HEART CATH AND CORONARY ANGIOGRAPHY;  Surgeon: Kathleene Hazel, MD;  Location: [facility];  Service: Cardiovascular;  Laterality: N/A;   TEE WITHOUT CARDIOVERSION N/A 04/04/2021   Procedure: TRANSESOPHAGEAL ECHOCARDIOGRAM (TEE);  Surgeon: Loreli Slot, MD;  Location: [facility] OR;  Service: Open Heart Surgery;  Laterality: N/A;   WISDOM TOOTH EXTRACTION      Family History  Problem Relation Age of Onset   Cancer Mother        stomach   Stroke Father 23   Aneurysm Sister        brain aneurysm   Cancer Maternal Grandfather        lung    Social History   Socioeconomic History   Marital status: Legally Separated    Spouse name: Not on file   Number of children: 6   Years of education: 12   Highest education level: Not on file  Occupational History   Occupation: concrete pre cast  Tobacco Use   Smoking status: Every Day    Current packs/day: 0.50    Average packs/day: 0.5 packs/day for 34.8  years (17.4 ttl pk-yrs)    Types: Cigarettes    Start date: 02/12/1988   Smokeless tobacco: Never  Vaping Use   Vaping status: Never Used  Substance and Sexual Activity   Alcohol use: Yes    Alcohol/week: 14.0 standard drinks of alcohol    Types: 14 Cans of beer per week    Comment:  weekly   Drug use: Yes    Types: Marijuana    Comment: rare   Sexual activity: Not Currently    Birth control/protection: None  Other Topics Concern   Not on file  Social History Narrative   Born in [place]   Worked in [place] for 17 years   Moved back Dec 2018   Lives with Sister, Roselee [place], [place]   Family nearby   Social Determinants of Health   Financial Resource Strain: Not on file  Food Insecurity: Not on file  Transportation Needs: Not on file  Physical Activity: Not on file  Stress: Not on file  Social Connections: Not on file  Intimate Partner Violence: Not on file    Outpatient Medications Prior to Visit  Medication Sig Dispense Refill   aspirin EC 81 MG EC tablet Take 1 tablet (81 mg total) by mouth daily.  Swallow whole. 30 tablet 11   clobetasol cream (TEMOVATE) 0.05 % Apply twice daily until clear. Avoid applying to face, groin, and axilla. 60 g 2   metoprolol succinate (TOPROL XL) 25 MG 24 hr tablet Take 0.5 tablets (12.5 mg total) by mouth daily. 45 tablet 3   rosuvastatin (CRESTOR) 10 MG tablet Take 1 tablet (10 mg total) by mouth daily. 90 tablet 3   triamcinolone cream (KENALOG) 0.1 % Apply 1 Application topically 2 (two) times daily. 28.4 g 0   Vitamin D, Ergocalciferol, (DRISDOL) 1.25 MG (50000 UNIT) CAPS capsule Take 1 capsule (50,000 Units total) by mouth every 7 (seven) days. 20 capsule 1   warfarin (COUMADIN) 5 MG tablet TAKE 1 AND 1/2-2 TABLETS DAILY AS DIRECTED BY COUMADIN CLINIC 60 tablet 4   Facility-Administered Medications Prior to Visit  Medication Dose Route Frequency Provider Last Rate Last Admin   sodium chloride flush (NS) 0.9 % injection 3 mL  3 mL  Intravenous Q12H Branch, Dorothe Pea, MD        Allergies  Allergen Reactions   Other Anaphylaxis    Feathers (pillow stuffing)   Chocolate Hives    ROS Review of Systems    Objective:    Physical Exam  BP 138/86   Pulse 73   Wt 176 lb 1.9 oz (79.9 kg)   SpO2 95%   BMI 22.61 kg/m  Wt Readings from Last 3 Encounters:  12/10/22 176 lb 1.9 oz (79.9 kg)  12/04/22 171 lb 9.6 oz (77.8 kg)  06/26/22 178 lb 6.4 oz (80.9 kg)    Lab Results  Component Value Date   TSH 0.880 06/26/2022   Lab Results  Component Value Date   WBC 5.3 06/26/2022   HGB 14.6 06/26/2022   HCT 42.3 06/26/2022   MCV 95 06/26/2022   PLT 174 06/26/2022   Lab Results  Component Value Date   NA 139 06/26/2022   K 4.0 06/26/2022   CO2 20 06/26/2022   GLUCOSE 108 (H) 06/26/2022   BUN 9 06/26/2022   CREATININE 0.86 06/26/2022   BILITOT 0.9 06/26/2022   ALKPHOS 76 06/26/2022   AST 29 06/26/2022   ALT 22 06/26/2022   PROT 7.2 06/26/2022   ALBUMIN 4.3 06/26/2022   CALCIUM 9.3 06/26/2022   ANIONGAP 9 12/13/2021   EGFR 100 06/26/2022   Lab Results  Component Value Date   CHOL 193 06/26/2022   Lab Results  Component Value Date   HDL 41 06/26/2022   Lab Results  Component Value Date   LDLCALC 117 (H) 06/26/2022   Lab Results  Component Value Date   TRIG 199 (H) 06/26/2022   Lab Results  Component Value Date   CHOLHDL 4.7 06/26/2022   Lab Results  Component Value Date   HGBA1C 5.8 (H) 06/26/2022      Assessment & Plan:  Colon cancer screening    Follow-up: No follow-ups on file.   Gilmore Laroche, FNP

## 2022-12-10 NOTE — Patient Instructions (Addendum)
I appreciate the opportunity to provide care to you today!    Follow up:  3 months   -Your blood pressure is controlled today in the clinic -I recommend low sodium diet with increased physical activity -Please report to the emergency department if your blood pressure exceeds 180/120 and is accompanied by symptoms such as headaches, chest pain, palpitations, blurred vision, or dizziness.  Nonpharmacologic management of stress  Mindfulness and Meditation Practices like mindfulness meditation can help reduce symptoms by promoting relaxation and present-moment awareness.  Exercise  Regular physical activity has been shown to improve mood and reduce anxiety through the release of endorphins and other neurochemicals.  Healthy Diet Eating a balanced diet rich in fruits, vegetables, whole grains, and lean proteins can support overall mental health.  Sleep Hygiene  Establishing a regular sleep routine and ensuring good sleep quality can significantly impact mood and anxiety levels.  Stress Management Techniques Activities such as yoga, tai chi, and deep breathing exercises can help manage stress.  Social Support Maintaining strong relationships and seeking support from friends, family, or support groups can provide emotional comfort and reduce feelings of isolation.  Lifestyle Modifications Reducing alcohol and caffeine intake, quitting smoking, and avoiding recreational drugs can improve symptoms.  Art and Music Therapy Engaging in creative activities like painting, drawing, or playing music can be therapeutic and help express emotions.    Attached with your AVS, you will find valuable resources for self-education. I highly recommend dedicating some time to thoroughly examine them.   Please continue to a heart-healthy diet and increase your physical activities. Try to exercise for at least five days a week.    It was a pleasure to see you and I look forward to continuing to work  together on your health and well-being. Please do not hesitate to call the office if you need care or have questions about your care.  In case of emergency, please visit the Emergency Department for urgent care, or contact our clinic at [phone] to schedule an appointment. We're here to help you!   Have a wonderful day and week. With Gratitude, Gilmore Laroche MSN, FNP-BC

## 2022-12-12 ENCOUNTER — Ambulatory Visit: Payer: Managed Care, Other (non HMO) | Attending: Family Medicine

## 2022-12-12 NOTE — Assessment & Plan Note (Signed)
Controlled today Encouraged to follow up if BP is constantly >140/90 Low sodium diet with increased physical activity encouraged BP Readings from Last 3 Encounters:  12/10/22 138/86  12/04/22 (!) 168/84  06/26/22 136/72

## 2023-02-09 ENCOUNTER — Other Ambulatory Visit: Payer: Self-pay | Admitting: Cardiology

## 2023-02-09 DIAGNOSIS — Z952 Presence of prosthetic heart valve: Secondary | ICD-10-CM

## 2023-02-10 NOTE — Telephone Encounter (Signed)
Called spoke with pt, made f/u appt in [facility] for Friday 02/14/23.  Pt states he has enough Warfarin to get him to this appt, pt is aware we will refill his Warfarin rx at this appt.

## 2023-02-10 NOTE — Telephone Encounter (Signed)
Pt has not been seen since 07/18/22, overdue for follow-up.  Needs INR for Warfarin refill. Attempted to call pt to schedule follow-up appt, No answer.

## 2023-02-14 ENCOUNTER — Ambulatory Visit: Payer: Managed Care, Other (non HMO)

## 2023-02-14 NOTE — Telephone Encounter (Signed)
 Rescheduled appt from today till Monday 02/17/23.  Will wait to refill RX then.

## 2023-02-17 ENCOUNTER — Ambulatory Visit: Payer: Managed Care, Other (non HMO) | Attending: Cardiology | Admitting: *Deleted

## 2023-02-17 DIAGNOSIS — Z952 Presence of prosthetic heart valve: Secondary | ICD-10-CM | POA: Diagnosis not present

## 2023-02-17 DIAGNOSIS — Z5181 Encounter for therapeutic drug level monitoring: Secondary | ICD-10-CM

## 2023-02-17 DIAGNOSIS — E7849 Other hyperlipidemia: Secondary | ICD-10-CM

## 2023-02-17 LAB — POCT INR: INR: 1.3 — AB (ref 2.0–3.0)

## 2023-02-17 MED ORDER — ROSUVASTATIN CALCIUM 10 MG PO TABS
10.0000 mg | ORAL_TABLET | Freq: Every day | ORAL | 3 refills | Status: DC
Start: 2023-02-17 — End: 2023-12-15

## 2023-02-17 MED ORDER — WARFARIN SODIUM 5 MG PO TABS
ORAL_TABLET | ORAL | 4 refills | Status: DC
Start: 2023-02-17 — End: 2023-12-26

## 2023-02-17 NOTE — Patient Instructions (Signed)
 Take warfarin extra 1/2 tablet today and tomorrow then resume 1 1/2 tablets daily Recheck in 2 wks

## 2023-02-26 ENCOUNTER — Encounter: Payer: Self-pay | Admitting: Internal Medicine

## 2023-02-26 ENCOUNTER — Ambulatory Visit (INDEPENDENT_AMBULATORY_CARE_PROVIDER_SITE_OTHER): Payer: Managed Care, Other (non HMO) | Admitting: Internal Medicine

## 2023-02-26 VITALS — BP 120/71 | HR 79 | Ht 74.0 in | Wt 178.6 lb

## 2023-02-26 DIAGNOSIS — I350 Nonrheumatic aortic (valve) stenosis: Secondary | ICD-10-CM

## 2023-02-26 DIAGNOSIS — J209 Acute bronchitis, unspecified: Secondary | ICD-10-CM

## 2023-02-26 DIAGNOSIS — R197 Diarrhea, unspecified: Secondary | ICD-10-CM

## 2023-02-26 MED ORDER — PROMETHAZINE-DM 6.25-15 MG/5ML PO SYRP: 5.0000 mL | ORAL_SOLUTION | Freq: Four times a day (QID) | ORAL | 0 refills | Status: DC | PRN

## 2023-02-26 MED ORDER — AZITHROMYCIN 250 MG PO TABS: ORAL_TABLET | ORAL | 0 refills | Status: AC

## 2023-02-26 MED ORDER — ALBUTEROL SULFATE HFA 108 (90 BASE) MCG/ACT IN AERS: 2.0000 | INHALATION_SPRAY | Freq: Four times a day (QID) | RESPIRATORY_TRACT | 0 refills | Status: DC | PRN

## 2023-02-26 NOTE — Patient Instructions (Signed)
 Please start taking Azithromycin  as prescribed.  Take Promethazine -DM syrup as needed for cough.  Use Albuterol  inhaler as needed for shortness of breath or wheezing.

## 2023-02-26 NOTE — Progress Notes (Signed)
Acute Office Visit  Subjective:    Patient ID: Isaac Diaz, male    DOB: 08/19/1965, 58 y.o.   MRN: [id number]  Chief Complaint  Patient presents with   URI    Reports not feeling well has sx of cough congestion, headache, sob at times. Ongoing since November, states everyone in his household came down with pneumonia.     HPI Patient is in today for complaint of cough, sinus pressure related headache and dyspnea for the last 2 months.  Cough is mostly dry, but has had clear expectoration at times.  Denies any fever or chills.  He also reports having diarrhea for the last 1 week, which is mostly loose BM.  Denies nausea or vomiting.  He reports that his other family members also had URTI/pneumonia, but he is unclear about their diagnosis or treatment.  He smokes 0.5 pack/day.  Past Medical History:  Diagnosis Date   Allergy    Aortic stenosis    a. s/p AVR using an On-X 23 mm mechanical valve on 04/04/2021   Enlarged prostate    Heart murmur     Past Surgical History:  Procedure Laterality Date   AMPUTATION FINGER Right    fingertip right long   AORTIC VALVE REPLACEMENT N/A 04/04/2021   Procedure: AORTIC VALVE REPLACEMENT (AVR) USING ON-X VALVE SIZE ;  Surgeon: Loreli Slot, MD;  Location: [facility] OR;  Service: Open Heart Surgery;  Laterality: N/A;  mechanical   RIGHT HEART CATH AND CORONARY ANGIOGRAPHY N/A 03/16/2021   Procedure: RIGHT HEART CATH AND CORONARY ANGIOGRAPHY;  Surgeon: Kathleene Hazel, MD;  Location: [facility];  Service: Cardiovascular;  Laterality: N/A;   TEE WITHOUT CARDIOVERSION N/A 04/04/2021   Procedure: TRANSESOPHAGEAL ECHOCARDIOGRAM (TEE);  Surgeon: Loreli Slot, MD;  Location: [facility] OR;  Service: Open Heart Surgery;  Laterality: N/A;   WISDOM TOOTH EXTRACTION      Family History  Problem Relation Age of Onset   Cancer Mother        stomach   Stroke Father 62   Aneurysm Sister        brain aneurysm   Cancer Maternal  Grandfather        lung    Social History   Socioeconomic History   Marital status: Legally Separated    Spouse name: Not on file   Number of children: 6   Years of education: 12   Highest education level: Not on file  Occupational History   Occupation: concrete pre cast  Tobacco Use   Smoking status: Every Day    Current packs/day: 0.50    Average packs/day: 0.5 packs/day for 35.0 years (17.5 ttl pk-yrs)    Types: Cigarettes    Start date: 02/12/1988   Smokeless tobacco: Never  Vaping Use   Vaping status: Never Used  Substance and Sexual Activity   Alcohol use: Yes    Alcohol/week: 14.0 standard drinks of alcohol    Types: 14 Cans of beer per week    Comment:  weekly   Drug use: Yes    Types: Marijuana    Comment: rare   Sexual activity: Not Currently    Birth control/protection: None  Other Topics Concern   Not on file  Social History Narrative   Born in [place]   Worked in [place] for 17 years   Moved back Dec 2018   Lives with Sister, [employer]   Family nearby   Social Drivers of Health  Financial Resource Strain: Not on file  Food Insecurity: Not on file  Transportation Needs: Not on file  Physical Activity: Not on file  Stress: Not on file  Social Connections: Not on file  Intimate Partner Violence: Not on file    Outpatient Medications Prior to Visit  Medication Sig Dispense Refill   aspirin EC 81 MG EC tablet Take 1 tablet (81 mg total) by mouth daily. Swallow whole. 30 tablet 11   clobetasol cream (TEMOVATE) 0.05 % Apply twice daily until clear. Avoid applying to face, groin, and axilla. 60 g 2   metoprolol succinate (TOPROL XL) 25 MG 24 hr tablet Take 0.5 tablets (12.5 mg total) by mouth daily. 45 tablet 3   rosuvastatin (CRESTOR) 10 MG tablet Take 1 tablet (10 mg total) by mouth daily. 90 tablet 3   triamcinolone cream (KENALOG) 0.1 % Apply 1 Application topically 2 (two) times daily. 28.4 g 0   Vitamin D, Ergocalciferol, (DRISDOL) 1.25 MG  (50000 UNIT) CAPS capsule Take 1 capsule (50,000 Units total) by mouth every 7 (seven) days. 20 capsule 1   warfarin (COUMADIN) 5 MG tablet TAKE 1 AND 1/2-2 TABLETS DAILY AS DIRECTED BY COUMADIN CLINIC 60 tablet 4   Facility-Administered Medications Prior to Visit  Medication Dose Route Frequency Provider Last Rate Last Admin   sodium chloride flush (NS) 0.9 % injection 3 mL  3 mL Intravenous Q12H Branch, Dorothe Pea, MD        Allergies  Allergen Reactions   Other Anaphylaxis    Feathers (pillow stuffing)   Chocolate Hives    Review of Systems  Constitutional:  Positive for fatigue. Negative for chills and fever.  HENT:  Positive for congestion, postnasal drip and sinus pressure.   Eyes:  Negative for pain and discharge.  Respiratory:  Positive for cough and shortness of breath.   Cardiovascular:  Negative for chest pain and palpitations.  Gastrointestinal:  Positive for diarrhea. Negative for nausea and vomiting.  Endocrine: Negative for polydipsia and polyuria.  Genitourinary:  Negative for dysuria and hematuria.  Musculoskeletal:  Negative for neck pain and neck stiffness.  Skin:  Negative for rash.  Neurological:  Negative for dizziness, weakness, numbness and headaches.  Psychiatric/Behavioral:  Negative for agitation and behavioral problems.        Objective:    Physical Exam Vitals reviewed.  Constitutional:      General: He is not in acute distress.    Appearance: He is not diaphoretic.  HENT:     Head: Normocephalic and atraumatic.     Nose: Congestion present.     Mouth/Throat:     Mouth: Mucous membranes are moist.  Eyes:     General: No scleral icterus.    Extraocular Movements: Extraocular movements intact.     Pupils: Pupils are equal, round, and reactive to light.  Cardiovascular:     Rate and Rhythm: Normal rate and regular rhythm.     Heart sounds: Normal heart sounds. No murmur heard. Pulmonary:     Breath sounds: Wheezing (Mild, b/l) present. No  rales.  Abdominal:     Palpations: Abdomen is soft.     Tenderness: There is no abdominal tenderness.  Musculoskeletal:     Cervical back: Neck supple. No tenderness.     Right lower leg: No edema.     Left lower leg: No edema.  Skin:    General: Skin is warm.     Findings: No rash.  Neurological:     General:  No focal deficit present.     Mental Status: He is alert and oriented to person, place, and time.  Psychiatric:        Mood and Affect: Mood normal.        Behavior: Behavior normal.     BP 120/71   Pulse 79   Ht 6\' 2"  (1.88 m)   Wt 178 lb 9.6 oz (81 kg)   SpO2 97%   BMI 22.93 kg/m  Wt Readings from Last 3 Encounters:  02/26/23 178 lb 9.6 oz (81 kg)  12/10/22 176 lb 1.9 oz (79.9 kg)  12/04/22 171 lb 9.6 oz (77.8 kg)        Assessment & Plan:   Problem List Items Addressed This Visit       Cardiovascular and Mediastinum   Aortic stenosis, severe   S/p AVR On Coumadin If persistent dyspnea, will need Cardiology evaluation        Respiratory   Acute bronchitis - Primary   His current symptoms are suggestive of acute bronchitis Considering his other chronic medical conditions, started empiric azithromycin -considering he has diarrhea, would provide Legionella coverage Promethazine DM as needed for cough Albuterol as needed for dyspnea or wheezing If persistent symptoms, will start steroids Needs to avoid smoking      Relevant Medications   azithromycin (ZITHROMAX) 250 MG tablet   promethazine-dextromethorphan (PROMETHAZINE-DM) 6.25-15 MG/5ML syrup   albuterol (VENTOLIN HFA) 108 (90 Base) MCG/ACT inhaler   Other Relevant Orders   L. Pneumophila Serogp 1 Ur Ag   Streptococcus Pneumoniae Ag     Digestive   Diarrhea of presumed infectious origin   Recent episodes of loose BM could be due to infectious etiology Usually viral infections can lead to GI symptoms as well, but would prefer to rule out Legionella as well -check urine Legionella         Meds ordered this encounter  Medications   azithromycin (ZITHROMAX) 250 MG tablet    Sig: Take 2 tablets on day 1, then 1 tablet daily on days 2 through 5    Dispense:  6 tablet    Refill:  0   promethazine-dextromethorphan (PROMETHAZINE-DM) 6.25-15 MG/5ML syrup    Sig: Take 5 mLs by mouth 4 (four) times daily as needed.    Dispense:  118 mL    Refill:  0   albuterol (VENTOLIN HFA) 108 (90 Base) MCG/ACT inhaler    Sig: Inhale 2 puffs into the lungs every 6 (six) hours as needed for wheezing or shortness of breath.    Dispense:  18 g    Refill:  0    Okay to substitute to generic/formulary Albuterol.     Anabel Halon, MD

## 2023-02-27 DIAGNOSIS — R197 Diarrhea, unspecified: Secondary | ICD-10-CM | POA: Insufficient documentation

## 2023-02-27 DIAGNOSIS — J209 Acute bronchitis, unspecified: Secondary | ICD-10-CM | POA: Insufficient documentation

## 2023-02-27 NOTE — Assessment & Plan Note (Signed)
Recent episodes of loose BM could be due to infectious etiology Usually viral infections can lead to GI symptoms as well, but would prefer to rule out Legionella as well -check urine Legionella

## 2023-02-27 NOTE — Assessment & Plan Note (Signed)
S/p AVR On Coumadin If persistent dyspnea, will need Cardiology evaluation

## 2023-02-27 NOTE — Assessment & Plan Note (Addendum)
His current symptoms are suggestive of acute bronchitis Considering his other chronic medical conditions, started empiric azithromycin -considering he has diarrhea, would provide Legionella coverage Promethazine DM as needed for cough Albuterol as needed for dyspnea or wheezing If persistent symptoms, will start steroids Needs to avoid smoking

## 2023-03-01 LAB — STREPTOCOCCUS PNEUMONIAE AG (CSF): Streptococcus Pneumoniae Ag: NEGATIVE

## 2023-03-01 LAB — LEGIONELLA PNEUMOPHILA SEROGP 1 UR AG: L. pneumophila Serogp 1 Ur Ag: NEGATIVE

## 2023-03-12 ENCOUNTER — Ambulatory Visit (INDEPENDENT_AMBULATORY_CARE_PROVIDER_SITE_OTHER): Payer: Managed Care, Other (non HMO) | Admitting: Internal Medicine

## 2023-03-12 ENCOUNTER — Encounter: Payer: Self-pay | Admitting: Internal Medicine

## 2023-03-12 VITALS — BP 138/84 | HR 71 | Ht 74.0 in | Wt 173.6 lb

## 2023-03-12 DIAGNOSIS — Z1159 Encounter for screening for other viral diseases: Secondary | ICD-10-CM

## 2023-03-12 DIAGNOSIS — J069 Acute upper respiratory infection, unspecified: Secondary | ICD-10-CM

## 2023-03-12 DIAGNOSIS — R768 Other specified abnormal immunological findings in serum: Secondary | ICD-10-CM

## 2023-03-12 DIAGNOSIS — K529 Noninfective gastroenteritis and colitis, unspecified: Secondary | ICD-10-CM

## 2023-03-12 DIAGNOSIS — R197 Diarrhea, unspecified: Secondary | ICD-10-CM

## 2023-03-12 DIAGNOSIS — Z114 Encounter for screening for human immunodeficiency virus [HIV]: Secondary | ICD-10-CM

## 2023-03-12 MED ORDER — LEVOCETIRIZINE DIHYDROCHLORIDE 5 MG PO TABS: 5.0000 mg | ORAL_TABLET | Freq: Every evening | ORAL | 2 refills | Status: DC

## 2023-03-12 MED ORDER — FLUTICASONE PROPIONATE 50 MCG/ACT NA SUSP: 2.0000 | Freq: Every day | NASAL | 1 refills | Status: DC

## 2023-03-12 NOTE — Assessment & Plan Note (Addendum)
Recent episodes of loose BM could be due to infectious etiology Usually viral infections can lead to GI symptoms as well, check flu, COVID and RSV Check GI stool profile as she has persistent diarrhea for 3 weeks Maintain adequate hydration

## 2023-03-12 NOTE — Patient Instructions (Signed)
Please start taking Xyzal as prescribed.  Please use Flonase for nasal congestion/allergies.  Please continue to maintain at least 64 ounces of fluid intake in a day.

## 2023-03-12 NOTE — Progress Notes (Signed)
Acute Office Visit  Subjective:    Patient ID: Isaac Diaz, male    DOB: [birth year], 58 y.o.   MRN: [id number]  Chief Complaint  Patient presents with   Follow-up    2 week f/u, reports feeling a little better, however still experiencing same sx of diarrhea .    HPI Patient is in today for f/u of acute bronchitis. He had cough, sinus pressure related headache and dyspnea for the last 2 months.  He has completed azithromycin, which had improved his nasal congestion and sinus pressure.  He has recurrence of mild nasal congestion and sore throat for the last 3 days. Cough is mostly dry.   Denies any fever or chills. He reports that many of his coworkers recently had COVID infection.  He also reports having persistent diarrhea for the last 3 weeks, which is mostly loose BM.  Denies nausea or vomiting.  He reports that his other family members also had URTI/pneumonia, but he is unclear about their diagnosis or treatment.  He smokes 0.5 pack/day.  Past Medical History:  Diagnosis Date   Allergy    Aortic stenosis    a. s/p AVR using an On-X 23 mm mechanical valve on 04/04/2021   Enlarged prostate    Heart murmur     Past Surgical History:  Procedure Laterality Date   AMPUTATION FINGER Right    fingertip right long   AORTIC VALVE REPLACEMENT N/A 04/04/2021   Procedure: AORTIC VALVE REPLACEMENT (AVR) USING ON-X VALVE SIZE ;  Surgeon: Loreli Slot, MD;  Location[facility] OR;  Service: Open Heart Surgery;  Laterality: N/A;  mechanical   RIGHT HEART CATH AND CORONARY ANGIOGRAPHY N/A 03/16/2021   Procedure: RIGHT HEART CATH AND CORONARY ANGIOGRAPHY;  Surgeon: Kathleene Hazel, MD;  Location: [facility];  Service: Cardiovascular;  Laterality: N/A;   TEE WITHOUT CARDIOVERSION N/A 04/04/2021   Procedure: TRANSESOPHAGEAL ECHOCARDIOGRAM (TEE);  Surgeon: Loreli Slot, MD;  Location: [facility] OR;  Service: Open Heart Surgery;  Laterality: N/A;   WISDOM TOOTH EXTRACTION       Family History  Problem Relation Age of Onset   Cancer Mother        stomach   Stroke Father 38   Aneurysm Sister        brain aneurysm   Cancer Maternal Grandfather        lung    Social History   Socioeconomic History   Marital status: Legally Separated    Spouse name: Not on file   Number of children: 6   Years of education: 12   Highest education level: Not on file  Occupational History   Occupation: concrete pre cast  Tobacco Use   Smoking status: Every Day    Current packs/day: 0.50    Average packs/day: 0.5 packs/day for 35.1 years (17.5 ttl pk-yrs)    Types: Cigarettes    Start date: 02/12/1988   Smokeless tobacco: Never  Vaping Use   Vaping status: Never Used  Substance and Sexual Activity   Alcohol use: Yes    Alcohol/week: 14.0 standard drinks of alcohol    Types: 14 Cans of beer per week    Comment:  weekly   Drug use: Yes    Types: Marijuana    Comment: rare   Sexual activity: Not Currently    Birth control/protection: None  Other Topics Concern   Not on file  Social History Narrative   Born in [place]   Worked in  [place] for 17 years   Moved back Dec 2018   Lives with Sister, Roselee [place], [place]   Family nearby   Social Drivers of Health   Financial Resource Strain: Not on [employer] Insecurity: Not on file  Transportation Needs: Not on file  Physical Activity: Not on file  Stress: Not on file  Social Connections: Not on file  Intimate Partner Violence: Not on file    Outpatient Medications Prior to Visit  Medication Sig Dispense Refill   albuterol (VENTOLIN HFA) 108 (90 Base) MCG/ACT inhaler Inhale 2 puffs into the lungs every 6 (six) hours as needed for wheezing or shortness of breath. 18 g 0   aspirin EC 81 MG EC tablet Take 1 tablet (81 mg total) by mouth daily. Swallow whole. 30 tablet 11   clobetasol cream (TEMOVATE) 0.05 % Apply twice daily until clear. Avoid applying to face, groin, and axilla. 60 g 2   metoprolol succinate  (TOPROL XL) 25 MG 24 hr tablet Take 0.5 tablets (12.5 mg total) by mouth daily. 45 tablet 3   rosuvastatin (CRESTOR) 10 MG tablet Take 1 tablet (10 mg total) by mouth daily. 90 tablet 3   triamcinolone cream (KENALOG) 0.1 % Apply 1 Application topically 2 (two) times daily. 28.4 g 0   Vitamin D, Ergocalciferol, (DRISDOL) 1.25 MG (50000 UNIT) CAPS capsule Take 1 capsule (50,000 Units total) by mouth every 7 (seven) days. 20 capsule 1   warfarin (COUMADIN) 5 MG tablet TAKE 1 AND 1/2-2 TABLETS DAILY AS DIRECTED BY COUMADIN CLINIC 60 tablet 4   promethazine-dextromethorphan (PROMETHAZINE-DM) 6.25-15 MG/5ML syrup Take 5 mLs by mouth 4 (four) times daily as needed. 118 mL 0   Facility-Administered Medications Prior to Visit  Medication Dose Route Frequency Provider Last Rate Last Admin   sodium chloride flush (NS) 0.9 % injection 3 mL  3 mL Intravenous Q12H Branch, Dorothe Pea, MD        Allergies  Allergen Reactions   Other Anaphylaxis    Feathers (pillow stuffing)   Chocolate Hives    Review of Systems  Constitutional:  Positive for fatigue. Negative for chills and fever.  HENT:  Positive for congestion, postnasal drip and sinus pressure.   Eyes:  Negative for pain and discharge.  Respiratory:  Positive for cough. Negative for shortness of breath.   Cardiovascular:  Negative for chest pain and palpitations.  Gastrointestinal:  Positive for diarrhea. Negative for nausea and vomiting.  Endocrine: Negative for polydipsia and polyuria.  Genitourinary:  Negative for dysuria and hematuria.  Musculoskeletal:  Negative for neck pain and neck stiffness.  Skin:  Negative for rash.  Neurological:  Negative for dizziness, weakness, numbness and headaches.  Psychiatric/Behavioral:  Negative for agitation and behavioral problems.        Objective:    Physical Exam Vitals reviewed.  Constitutional:      General: He is not in acute distress.    Appearance: He is not diaphoretic.  HENT:      Head: Normocephalic and atraumatic.     Nose: Congestion present.     Mouth/Throat:     Mouth: Mucous membranes are moist.  Eyes:     General: No scleral icterus.    Extraocular Movements: Extraocular movements intact.     Pupils: Pupils are equal, round, and reactive to light.  Cardiovascular:     Rate and Rhythm: Normal rate and regular rhythm.     Heart sounds: Normal heart sounds. No murmur heard. Pulmonary:  Breath sounds: Normal breath sounds. No wheezing or rales.  Abdominal:     Palpations: Abdomen is soft.     Tenderness: There is no abdominal tenderness.  Musculoskeletal:     Cervical back: Neck supple. No tenderness.     Right lower leg: No edema.     Left lower leg: No edema.  Skin:    General: Skin is warm.     Findings: No rash.  Neurological:     General: No focal deficit present.     Mental Status: He is alert and oriented to person, place, and time.  Psychiatric:        Mood and Affect: Mood normal.        Behavior: Behavior normal.     BP 138/84 (BP Location: Left Arm)   Pulse 71   Ht 6\' 2"  (1.88 m)   Wt 173 lb 9.6 oz (78.7 kg)   SpO2 97%   BMI 22.29 kg/m  Wt Readings from Last 3 Encounters:  03/12/23 173 lb 9.6 oz (78.7 kg)  02/26/23 178 lb 9.6 oz (81 kg)  12/10/22 176 lb 1.9 oz (79.9 kg)        Assessment & Plan:   Problem List Items Addressed This Visit       Respiratory   URTI (acute upper respiratory infection)   Has recurrence of nasal congestion and sinus pressure Due to exposure to COVID, check COVID RT-PCR Recently completed azithromycin Prescribed Flonase for nasal congestion Start Xyzal for allergies       Relevant Medications   fluticasone (FLONASE) 50 MCG/ACT nasal spray   levocetirizine (XYZAL) 5 MG tablet   Other Relevant Orders   COVID-19, Flu A+B and RSV     Digestive   Diarrhea of presumed infectious origin - Primary   Recent episodes of loose BM could be due to infectious etiology Usually viral infections  can lead to GI symptoms as well, check flu, COVID and RSV Check GI stool profile as she has persistent diarrhea for 3 weeks Maintain adequate hydration      Relevant Orders   GI Profile, Stool, PCR   CBC with Differential/Platelet   CMP14+EGFR   Other Visit Diagnoses       Need for hepatitis C screening test       Relevant Orders   Hepatitis C Antibody     Encounter for screening for HIV       Relevant Orders   HIV antibody (with reflex)         Meds ordered this encounter  Medications   fluticasone (FLONASE) 50 MCG/ACT nasal spray    Sig: Place 2 sprays into both nostrils daily.    Dispense:  16 g    Refill:  1   levocetirizine (XYZAL) 5 MG tablet    Sig: Take 1 tablet (5 mg total) by mouth every evening.    Dispense:  30 tablet    Refill:  2     Boone Gear Concha Se, MD

## 2023-03-12 NOTE — Assessment & Plan Note (Signed)
Has recurrence of nasal congestion and sinus pressure Due to exposure to COVID, check COVID RT-PCR Recently completed azithromycin Prescribed Flonase for nasal congestion Start Xyzal for allergies

## 2023-03-13 LAB — CMP14+EGFR
ALT: 19 [IU]/L (ref 0–44)
AST: 34 [IU]/L (ref 0–40)
Albumin: 4.3 g/dL (ref 3.8–4.9)
Alkaline Phosphatase: 77 [IU]/L (ref 44–121)
BUN/Creatinine Ratio: 16 (ref 9–20)
BUN: 12 mg/dL (ref 6–24)
Bilirubin Total: 0.7 mg/dL (ref 0.0–1.2)
CO2: 20 mmol/L (ref 20–29)
Calcium: 9.2 mg/dL (ref 8.7–10.2)
Chloride: 110 mmol/L — ABNORMAL HIGH (ref 96–106)
Creatinine, Ser: 0.75 mg/dL — ABNORMAL LOW (ref 0.76–1.27)
Globulin, Total: 2.6 g/dL (ref 1.5–4.5)
Glucose: 79 mg/dL (ref 70–99)
Potassium: 4 mmol/L (ref 3.5–5.2)
Sodium: 144 mmol/L (ref 134–144)
Total Protein: 6.9 g/dL (ref 6.0–8.5)
eGFR: 104 mL/min/{1.73_m2} (ref >59)

## 2023-03-13 LAB — CBC WITH DIFFERENTIAL/PLATELET
Basophils Absolute: 0 10*3/uL (ref 0.0–0.2)
Basos: 1 %
EOS (ABSOLUTE): 0.1 10*3/uL (ref 0.0–0.4)
Eos: 3 %
Hematocrit: 37.4 % — ABNORMAL LOW (ref 37.5–51.0)
Hemoglobin: 12.9 g/dL — ABNORMAL LOW (ref 13.0–17.7)
Immature Grans (Abs): 0 10*3/uL (ref 0.0–0.1)
Immature Granulocytes: 0 %
Lymphocytes Absolute: 2 10*3/uL (ref 0.7–3.1)
Lymphs: 37 %
MCH: 33.4 pg — ABNORMAL HIGH (ref 26.6–33.0)
MCHC: 34.5 g/dL (ref 31.5–35.7)
MCV: 97 fL (ref 79–97)
Monocytes Absolute: 0.4 10*3/uL (ref 0.1–0.9)
Monocytes: 7 %
Neutrophils Absolute: 2.8 10*3/uL (ref 1.4–7.0)
Neutrophils: 52 %
Platelets: 178 10*3/uL (ref 150–450)
RBC: 3.86 x10E6/uL — ABNORMAL LOW (ref 4.14–5.80)
RDW: 13.2 % (ref 11.6–15.4)
WBC: 5.2 10*3/uL (ref 3.4–10.8)

## 2023-03-13 LAB — HIV ANTIBODY (ROUTINE TESTING W REFLEX): HIV Screen 4th Generation wRfx: NONREACTIVE

## 2023-03-13 LAB — HEPATITIS C ANTIBODY: Hep C Virus Ab: REACTIVE — AB

## 2023-03-13 NOTE — Addendum Note (Signed)
Addended byTrena Platt on: 03/13/2023 08:03 AM   Modules accepted: Orders

## 2023-03-14 LAB — COVID-19, FLU A+B AND RSV
Influenza A, NAA: NOT DETECTED
Influenza B, NAA: NOT DETECTED
RSV, NAA: NOT DETECTED
SARS-CoV-2, NAA: NOT DETECTED

## 2023-03-17 ENCOUNTER — Other Ambulatory Visit (HOSPITAL_COMMUNITY): Payer: Self-pay

## 2023-03-17 ENCOUNTER — Telehealth: Payer: Self-pay

## 2023-03-17 ENCOUNTER — Encounter (INDEPENDENT_AMBULATORY_CARE_PROVIDER_SITE_OTHER): Payer: Self-pay | Admitting: *Deleted

## 2023-03-17 NOTE — Telephone Encounter (Signed)
[facility] Patient Advocate Encounter  Insurance verification completed.    The patient is insured through [employer]. Patient has [employer], may use a copay card, and/or apply for patient assistance if available.    Ran test claim for MAVYRET  Medication will need a PA.   Ran test claim for [employer]   Medication will need a PA.   We will continue to follow to see if copay assistance is needed.  This test claim was processed through [facility]- copay amounts may vary at other pharmacies due to pharmacy/plan contracts, or as the patient moves through the different stages of their insurance plan.

## 2023-03-18 ENCOUNTER — Encounter: Payer: Managed Care, Other (non HMO) | Admitting: Internal Medicine

## 2023-03-26 ENCOUNTER — Other Ambulatory Visit: Payer: Self-pay | Admitting: Internal Medicine

## 2023-03-26 DIAGNOSIS — J209 Acute bronchitis, unspecified: Secondary | ICD-10-CM

## 2023-03-28 ENCOUNTER — Encounter: Payer: Managed Care, Other (non HMO) | Admitting: Internal Medicine

## 2023-04-03 ENCOUNTER — Ambulatory Visit (INDEPENDENT_AMBULATORY_CARE_PROVIDER_SITE_OTHER): Payer: Managed Care, Other (non HMO) | Admitting: Gastroenterology

## 2023-04-11 ENCOUNTER — Other Ambulatory Visit: Payer: Self-pay | Admitting: Family Medicine

## 2023-04-11 DIAGNOSIS — E559 Vitamin D deficiency, unspecified: Secondary | ICD-10-CM

## 2023-04-18 ENCOUNTER — Other Ambulatory Visit: Payer: Self-pay | Admitting: Family Medicine

## 2023-04-18 DIAGNOSIS — J209 Acute bronchitis, unspecified: Secondary | ICD-10-CM

## 2023-05-06 ENCOUNTER — Other Ambulatory Visit: Payer: Self-pay | Admitting: Internal Medicine

## 2023-05-06 ENCOUNTER — Encounter: Payer: Managed Care, Other (non HMO) | Admitting: Internal Medicine

## 2023-05-06 DIAGNOSIS — B182 Chronic viral hepatitis C: Secondary | ICD-10-CM

## 2023-05-06 NOTE — Progress Notes (Signed)
 Patient referred for hep c evalation No appropriate labs yet   Will get hep c and genotype and fibrotest

## 2023-05-09 ENCOUNTER — Ambulatory Visit (INDEPENDENT_AMBULATORY_CARE_PROVIDER_SITE_OTHER): Payer: Managed Care, Other (non HMO) | Admitting: Gastroenterology

## 2023-06-09 ENCOUNTER — Encounter: Payer: Self-pay | Admitting: Family Medicine

## 2023-06-09 ENCOUNTER — Ambulatory Visit (INDEPENDENT_AMBULATORY_CARE_PROVIDER_SITE_OTHER): Payer: Managed Care, Other (non HMO) | Admitting: Family Medicine

## 2023-06-09 VITALS — BP 138/72 | HR 68 | Resp 16 | Ht 74.5 in | Wt 173.1 lb

## 2023-06-09 DIAGNOSIS — R030 Elevated blood-pressure reading, without diagnosis of hypertension: Secondary | ICD-10-CM | POA: Diagnosis not present

## 2023-06-09 DIAGNOSIS — Z720 Tobacco use: Secondary | ICD-10-CM

## 2023-06-09 NOTE — Patient Instructions (Signed)
 I appreciate the opportunity to provide care to you today!    Follow up:  4 months  Please check your blood pressure once daily and inform me if your readings are consistently >140/90    Attached with your AVS, you will find valuable resources for self-education. I highly recommend dedicating some time to thoroughly examine them.   Please continue to a heart-healthy diet and increase your physical activities. Try to exercise for at least five days a week.    It was a pleasure to see you and I look forward to continuing to work together on your health and well-being. Please do not hesitate to call the office if you need care or have questions about your care.  In case of emergency, please visit the Emergency Department for urgent care, or contact our clinic at [phone] to schedule an appointment. We're here to help you!   Have a wonderful day and week. With Gratitude, Taiesha Bovard MSN, FNP-BC

## 2023-06-09 NOTE — Progress Notes (Unsigned)
 Established Patient Office Visit  Subjective:  Patient ID: Isaac Diaz, male    DOB: [birth year]  Age: 58 y.o. MRN: [id number]  CC:  Chief Complaint  Patient presents with   follow up    Elevated bp    HPI Isaac Diaz is a 57 y.o. male with past medical history of hyperlipidemia, vit d def and elevated  presents for f/u of *** chronic medical conditions.     BP Readings from Last 3 Encounters:  06/09/23 (!) 154/80  03/12/23 138/84  02/26/23 120/71    Past Medical History:  Diagnosis Date   Allergy     Aortic stenosis    a. s/p AVR using an On-X 23 mm mechanical valve on 04/04/2021   Enlarged prostate    Heart murmur     Past Surgical History:  Procedure Laterality Date   AMPUTATION FINGER Right    fingertip right long   AORTIC VALVE REPLACEMENT N/A 04/04/2021   Procedure: AORTIC VALVE REPLACEMENT (AVR) USING ON-X VALVE SIZE ;  Surgeon: Zelphia Higashi, MD;  Location: [facility] OR;  Service: Open Heart Surgery;  Laterality: N/A;  mechanical   RIGHT HEART CATH AND CORONARY ANGIOGRAPHY N/A 03/16/2021   Procedure: RIGHT HEART CATH AND CORONARY ANGIOGRAPHY;  Surgeon: Odie Benne, MD;  Location: [facility];  Service: Cardiovascular;  Laterality: N/A;   TEE WITHOUT CARDIOVERSION N/A 04/04/2021   Procedure: TRANSESOPHAGEAL ECHOCARDIOGRAM (TEE);  Surgeon: Zelphia Higashi, MD;  Location: [facility] OR;  Service: Open Heart Surgery;  Laterality: N/A;   WISDOM TOOTH EXTRACTION      Family History  Problem Relation Age of Onset   Cancer Mother        stomach   Stroke Father 36   Aneurysm Sister        brain aneurysm   Cancer Maternal Grandfather        lung    Social History   Socioeconomic History   Marital status: Legally Separated    Spouse name: Not on file   Number of children: 6   Years of education: 12   Highest education level: Not on file  Occupational History   Occupation: concrete pre cast  Tobacco Use   Smoking status:  Every Day    Current packs/day: 0.50    Average packs/day: 0.5 packs/day for 35.3 years (17.7 ttl pk-yrs)    Types: Cigarettes    Start date: 02/12/1988   Smokeless tobacco: Never  Vaping Use   Vaping status: Never Used  Substance and Sexual Activity   Alcohol use: Yes    Alcohol/week: 14.0 standard drinks of alcohol    Types: 14 Cans of beer per week    Comment:  weekly   Drug use: Yes    Types: Marijuana    Comment: rare   Sexual activity: Not Currently    Birth control/protection: None  Other Topics Concern   Not on file  Social History Narrative   Born in [place]   Worked in [place] for 17 years   Moved back Dec 2018   Lives with Sister, Zeandre, Elysa H[place]   Family nearby   Social Drivers of Health   Financial Resource Strain: Not on [employer] Insecurity: Not on file  Transportation Needs: Not on file  Physical Activity: Not on file  Stress: Not on file  Social Connections: Not on file  Intimate Partner Violence: Not on file    Outpatient Medications Prior to Visit  Medication Sig Dispense Refill  albuterol  (VENTOLIN  HFA) 108 (90 Base) MCG/ACT inhaler INHALE 2 PUFFS INTO THE LUNGS EVERY 6 HOURS AS NEEDED FOR WHEEZING OR SHORTNESS OF BREATH 6.7 g 0   clobetasol  cream (TEMOVATE ) 0.05 % Apply twice daily until clear. Avoid applying to face, groin, and axilla. 60 g 2   fluticasone  (FLONASE ) 50 MCG/ACT nasal spray Place 2 sprays into both nostrils daily. 16 g 1   levocetirizine (XYZAL ) 5 MG tablet Take 1 tablet (5 mg total) by mouth every evening. 30 tablet 2   rosuvastatin  (CRESTOR ) 10 MG tablet Take 1 tablet (10 mg total) by mouth daily. 90 tablet 3   triamcinolone  cream (KENALOG ) 0.1 % Apply 1 Application topically 2 (two) times daily. 28.4 g 0   Vitamin D , Ergocalciferol , (DRISDOL ) 1.25 MG (50000 UNIT) CAPS capsule TAKE 1 CAPSULE BY MOUTH EVERY 7 DAYS 20 capsule 1   warfarin (COUMADIN ) 5 MG tablet TAKE 1 AND 1/2-2 TABLETS DAILY AS DIRECTED BY COUMADIN  CLINIC 60 tablet  4   aspirin  EC 81 MG EC tablet Take 1 tablet (81 mg total) by mouth daily. Swallow whole. 30 tablet 11   metoprolol  succinate (TOPROL  XL) 25 MG 24 hr tablet Take 0.5 tablets (12.5 mg total) by mouth daily. 45 tablet 3   Facility-Administered Medications Prior to Visit  Medication Dose Route Frequency Provider Last Rate Last Admin   sodium chloride  flush (NS) 0.9 % injection 3 mL  3 mL Intravenous Q12H Laurann Pollock, MD        Allergies  Allergen Reactions   Other Anaphylaxis    Feathers (pillow stuffing)   Chocolate Hives    ROS Review of Systems    Objective:    Physical Exam  BP (!) 154/80   Pulse 68   Resp 16   Ht 6' 2.5" (1.892 m)   Wt 173 lb 1.9 oz (78.5 kg)   SpO2 96%   BMI 21.93 kg/m  Wt Readings from Last 3 Encounters:  06/09/23 173 lb 1.9 oz (78.5 kg)  03/12/23 173 lb 9.6 oz (78.7 kg)  02/26/23 178 lb 9.6 oz (81 kg)    Lab Results  Component Value Date   TSH 0.880 06/26/2022   Lab Results  Component Value Date   WBC 5.2 03/12/2023   HGB 12.9 (L) 03/12/2023   HCT 37.4 (L) 03/12/2023   MCV 97 03/12/2023   PLT 178 03/12/2023   Lab Results  Component Value Date   NA 144 03/12/2023   K 4.0 03/12/2023   CO2 20 03/12/2023   GLUCOSE 79 03/12/2023   BUN 12 03/12/2023   CREATININE 0.75 (L) 03/12/2023   BILITOT 0.7 03/12/2023   ALKPHOS 77 03/12/2023   AST 34 03/12/2023   ALT 19 03/12/2023   PROT 6.9 03/12/2023   ALBUMIN  4.3 03/12/2023   CALCIUM  9.2 03/12/2023   ANIONGAP 9 12/13/2021   EGFR 104 03/12/2023   Lab Results  Component Value Date   CHOL 193 06/26/2022   Lab Results  Component Value Date   HDL 41 06/26/2022   Lab Results  Component Value Date   LDLCALC 117 (H) 06/26/2022   Lab Results  Component Value Date   TRIG 199 (H) 06/26/2022   Lab Results  Component Value Date   CHOLHDL 4.7 06/26/2022   Lab Results  Component Value Date   HGBA1C 5.8 (H) 06/26/2022      Assessment & Plan:  There are no diagnoses linked to  this encounter.  Follow-up: No follow-ups on file.  Alvia Jablonski, FNP

## 2023-06-11 NOTE — Assessment & Plan Note (Signed)
 Controlled today Encouraged to follow up if BP is constantly >140/90 Low sodium diet with increased physical activity encouraged BP Readings from Last 3 Encounters:  06/09/23 138/72  03/12/23 138/84  02/26/23 120/71

## 2023-06-11 NOTE — Assessment & Plan Note (Signed)
Smokes about 1 pack/day  Asked about quitting: confirms that he currently smokes cigarettes Advise to quit smoking: Educated about QUITTING to reduce the risk of cancer, cardio and cerebrovascular disease. Assess willingness: Unwilling to quit at this time, but is working on cutting back. Assist with counseling and pharmacotherapy: Counseled for 5 minutes and literature provided. Arrange for follow up: follow up in 3 months and continue to offer help.  

## 2023-06-13 ENCOUNTER — Other Ambulatory Visit: Payer: Self-pay | Admitting: Internal Medicine

## 2023-06-13 DIAGNOSIS — J069 Acute upper respiratory infection, unspecified: Secondary | ICD-10-CM

## 2023-08-03 IMAGING — DX DG CHEST 2V
2 series · 2 of 2 positions shown · non-contrast
Comparison: 04/06/2021 and older exams.

CLINICAL DATA: Follow-up, status post aortic valve replacement.

EXAM:
CHEST - 2 VIEW

[chest pa]
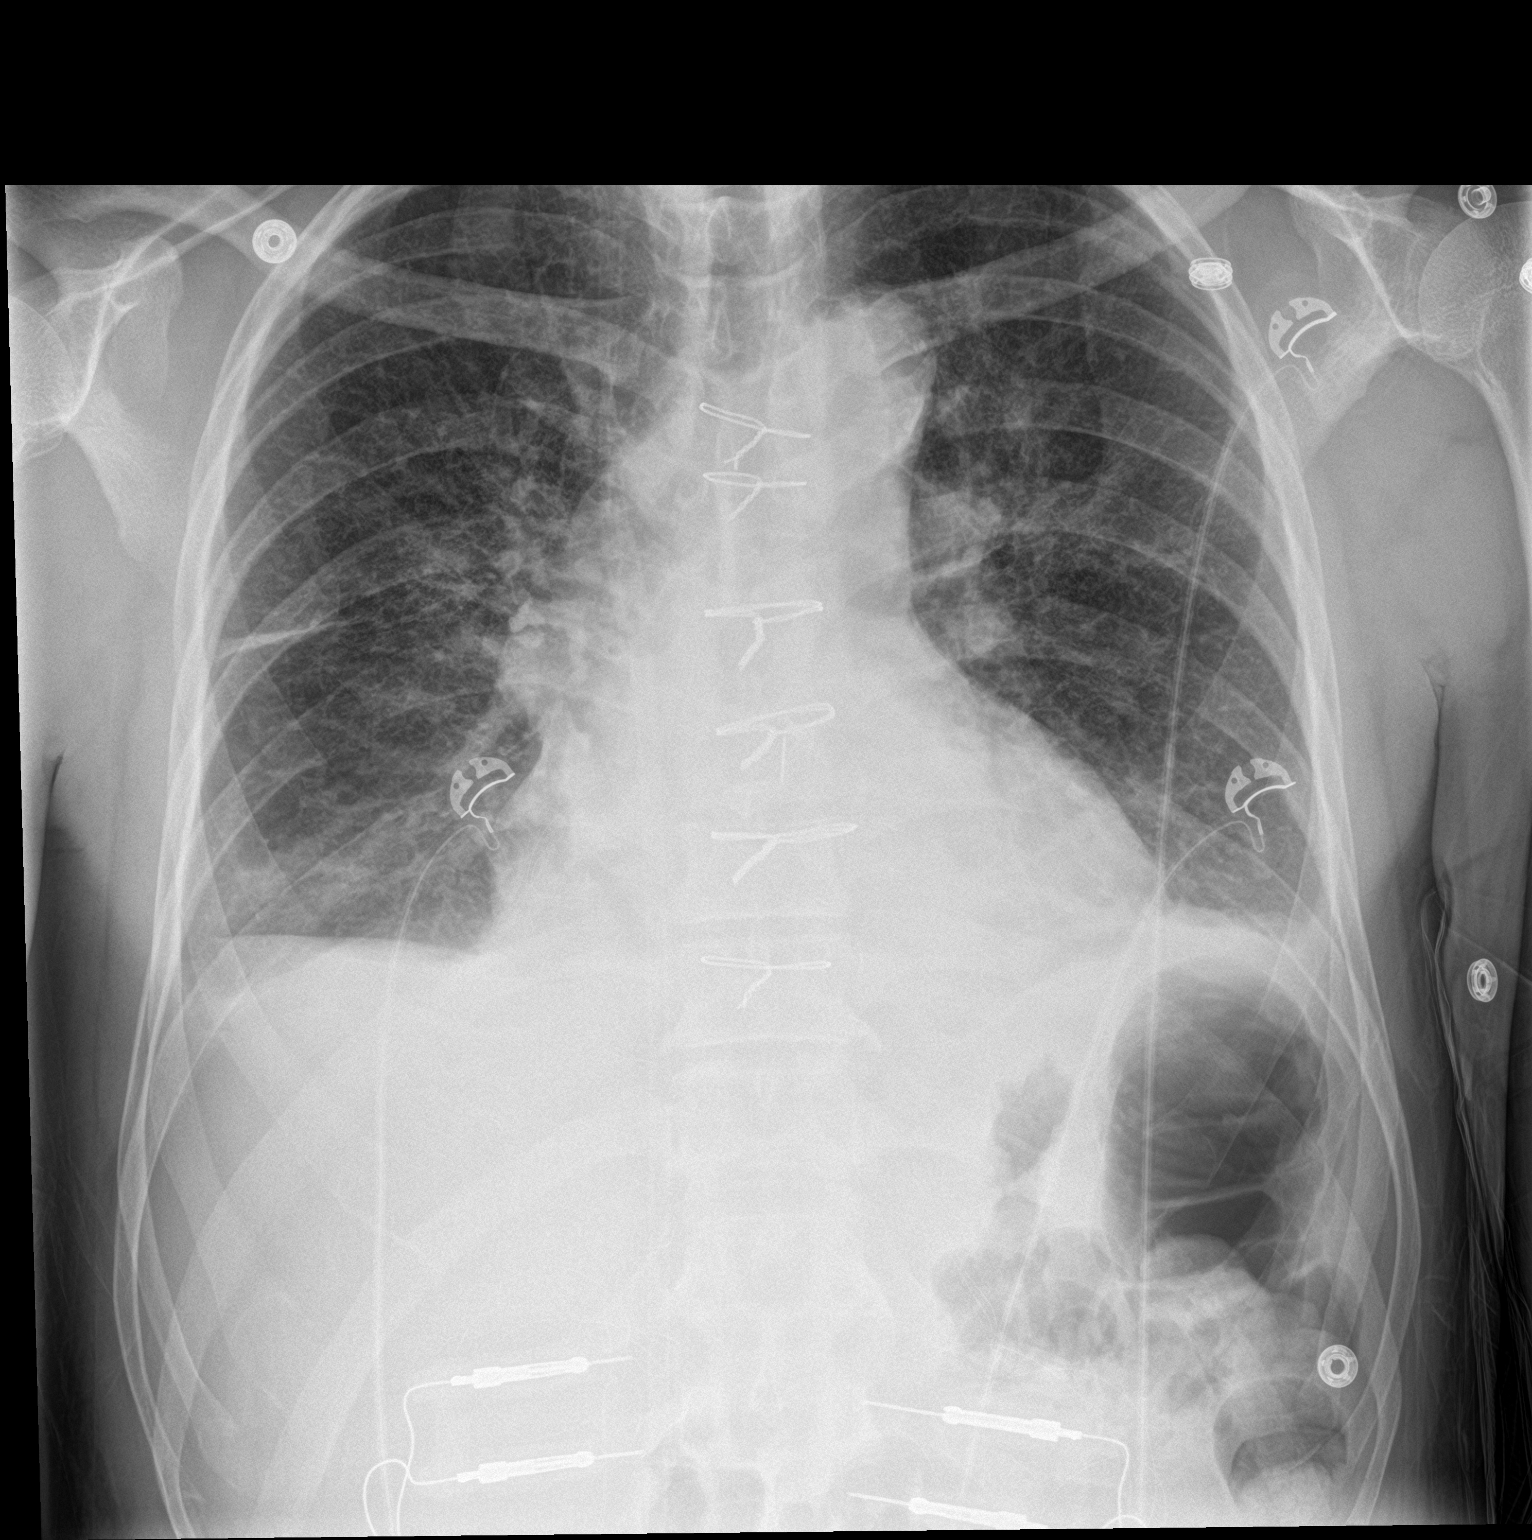

[chest lat]
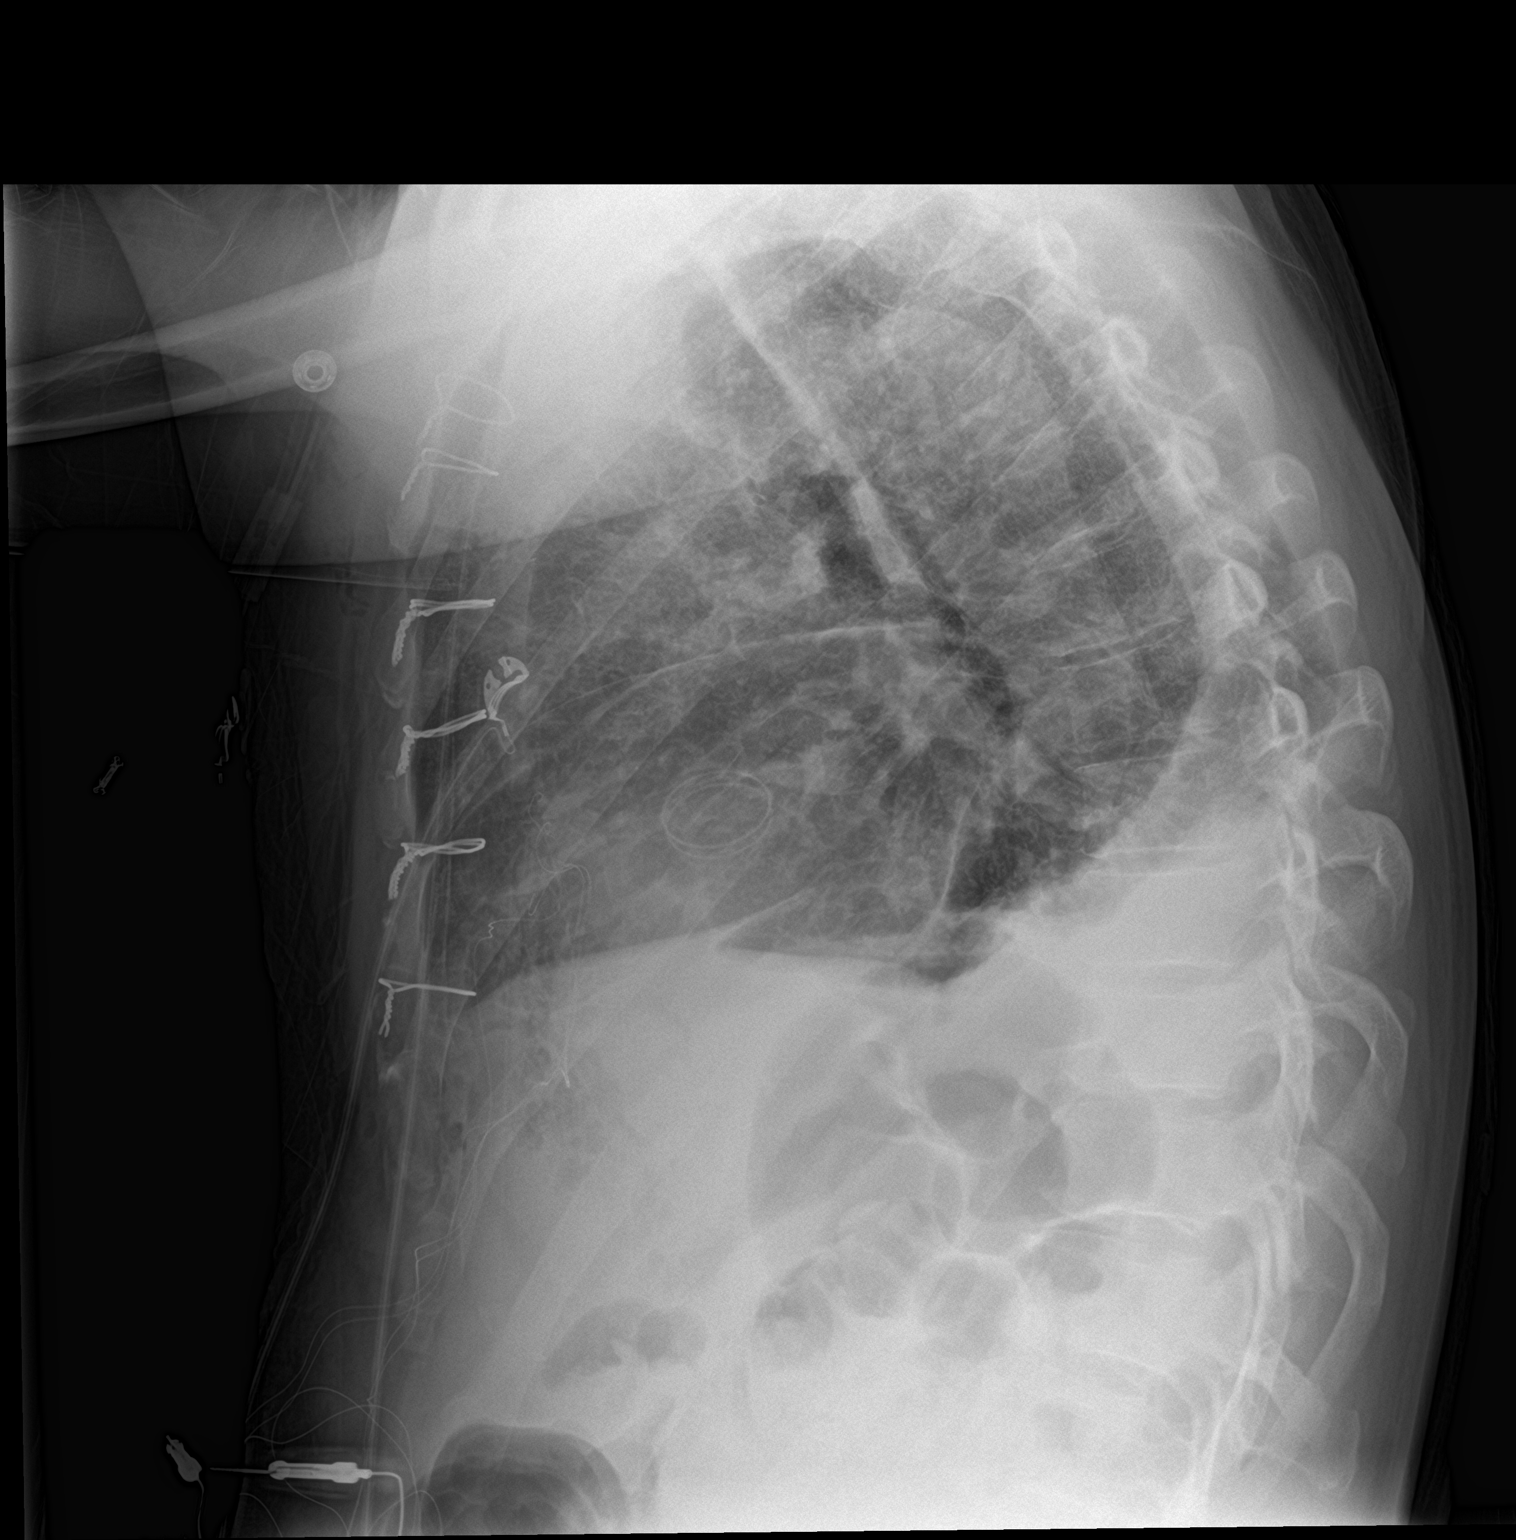

[2 of 2 positions shown; findings below may reference images not displayed]

FINDINGS: All lines and tubes have been removed.

Cardiac silhouette normal in size.  No mediastinal widening.

Opacities at the lung bases have mildly improved. Interstitial
thickening is similar to the previous day's study. No new lung
abnormalities.

No pneumothorax.
IMPRESSION: 1. Mild interval improvement with a decrease in lung base opacities
consistent with decreased pleural effusions and associated lung base
atelectasis or edema.
2. Persistent bilateral station thickening suggests mild residual
interstitial pulmonary edema.

## 2023-08-04 IMAGING — DX DG CHEST 1V PORT
1 series · 1 of 1 positions shown · non-contrast
Comparison: 04/07/2021

CLINICAL DATA: Dyspnea

EXAM:
PORTABLE CHEST 1 VIEW

[chest ap]
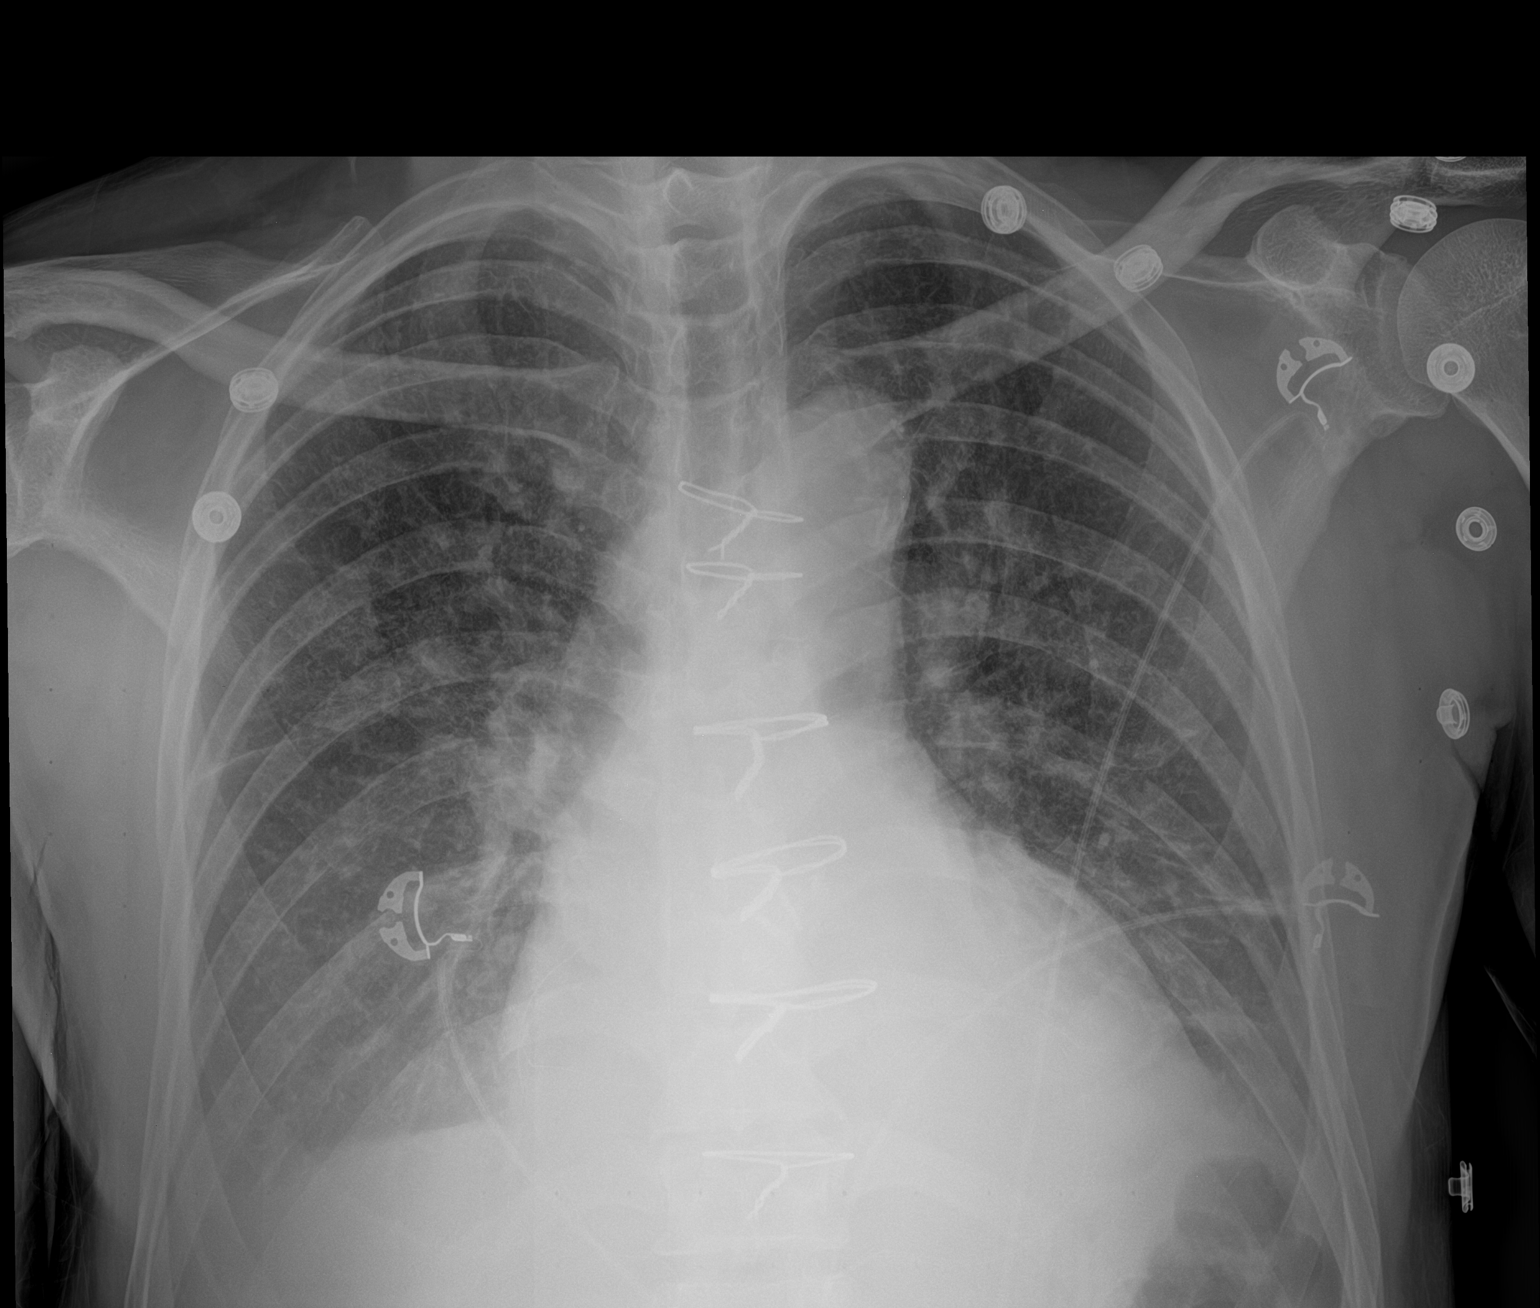

[1 of 1 positions shown; findings below may reference images not displayed]

FINDINGS: The lungs are symmetrically well expanded. Interval development of
small bilateral pleural effusions. Mild interstitial pulmonary edema
persists, possibly representing mild cardiogenic failure. No
pneumothorax. Median sternotomy has been performed. Cardiac size is
within normal limits. No acute bone abnormality.
IMPRESSION: Stable perihilar interstitial pulmonary edema possibly representing
mild cardiogenic failure. Enlarging bilateral pleural effusions.

## 2023-10-01 IMAGING — DX DG CHEST 2V
2 series · 2 of 2 positions shown · non-contrast
Comparison: AP chest 04/08/2021

CLINICAL DATA: Status post atrial valve replacement 04/04/2021

EXAM:
CHEST - 2 VIEW

[dg chest 2 view (1 of 2)]
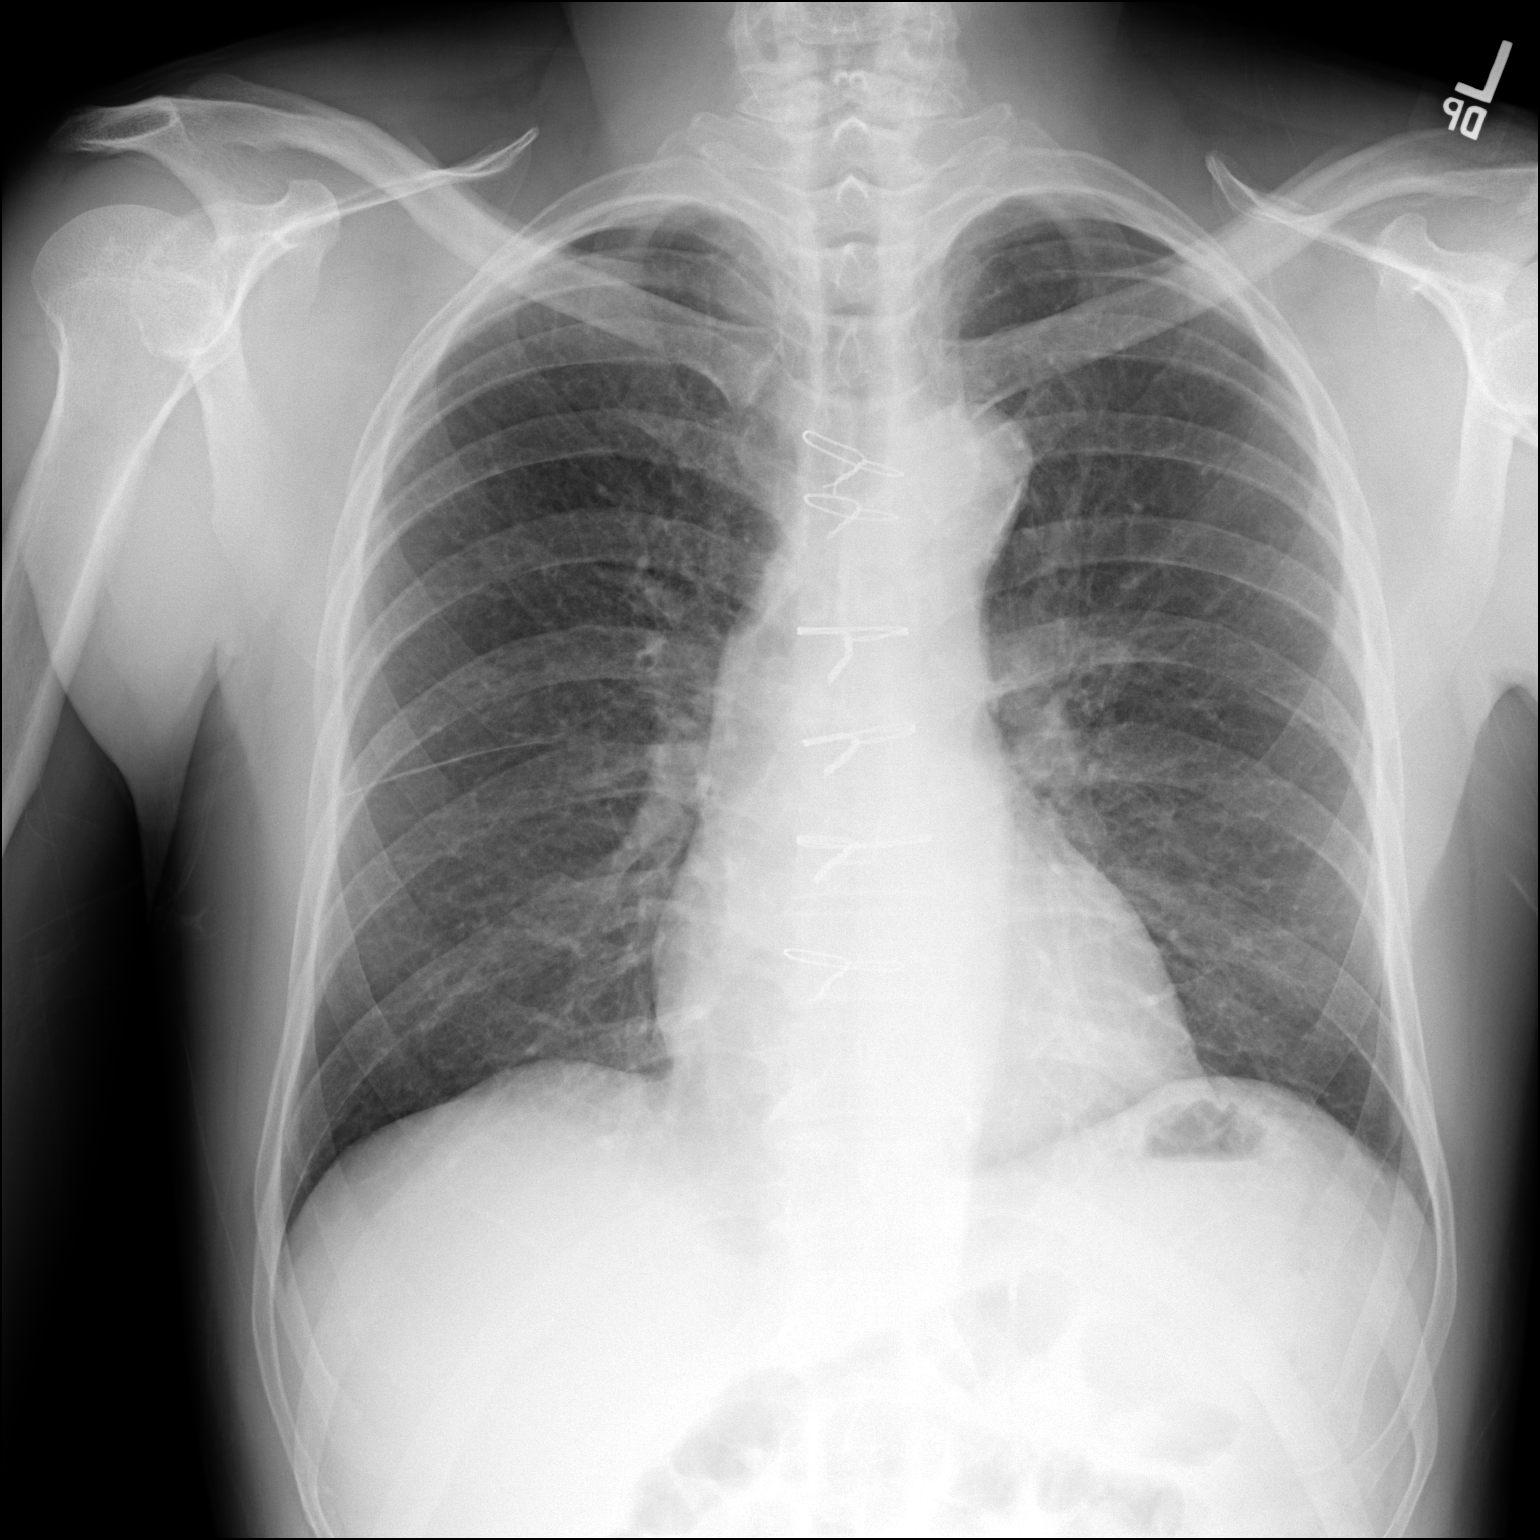

[dg chest 2 view (2 of 2)]
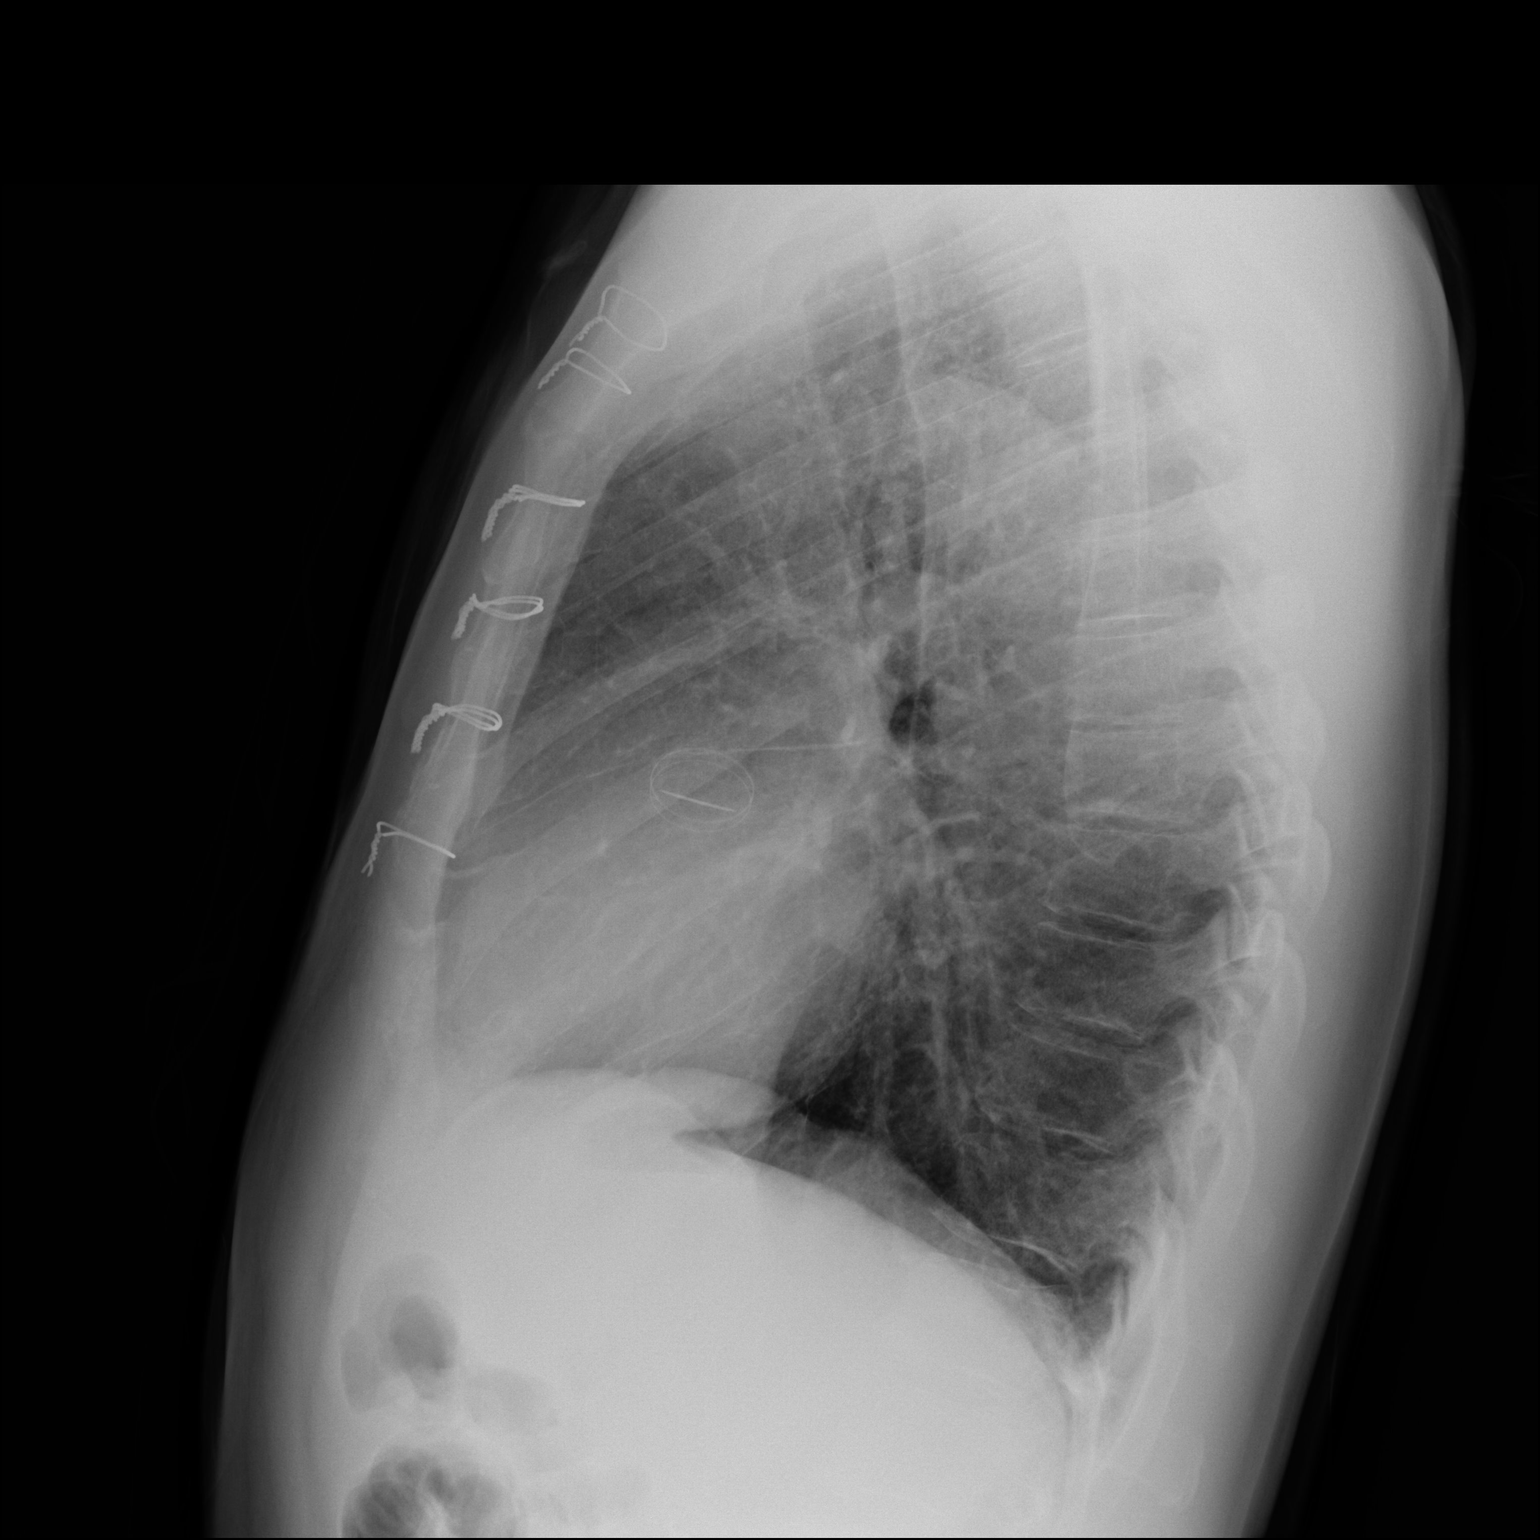

[2 of 2 positions shown; findings below may reference images not displayed]

FINDINGS: Status post median sternotomy. Cardiac silhouette and mediastinal
contours are within normal limits with calcification again seen
within aortic arch. Cardiac valve prosthesis is again seen. The
lungs are clear. Resolution of the prior bibasilar airspace
opacities. Resolution of the prior pleural effusions. No
pneumothorax. Minimal multilevel degenerative disc changes of the
thoracic spine.
IMPRESSION: No active cardiopulmonary disease.

## 2023-11-03 ENCOUNTER — Ambulatory Visit
Admission: EM | Admit: 2023-11-03 | Discharge: 2023-11-03 | Disposition: A | Discharge status: Home / Self Care | Attending: Nurse Practitioner | Admitting: Nurse Practitioner

## 2023-11-03 ENCOUNTER — Encounter: Payer: Self-pay | Admitting: Emergency Medicine

## 2023-11-03 DIAGNOSIS — J029 Acute pharyngitis, unspecified: Secondary | ICD-10-CM | POA: Diagnosis present

## 2023-11-03 DIAGNOSIS — J039 Acute tonsillitis, unspecified: Secondary | ICD-10-CM | POA: Insufficient documentation

## 2023-11-03 LAB — POC SOFIA SARS ANTIGEN FIA: SARS Coronavirus 2 Ag: NEGATIVE

## 2023-11-03 LAB — POCT RAPID STREP A (OFFICE): Rapid Strep A Screen: NEGATIVE

## 2023-11-03 MED ORDER — AMOXICILLIN-POT CLAVULANATE 875-125 MG PO TABS: 1.0000 | ORAL_TABLET | Freq: Two times a day (BID) | ORAL | 0 refills | Status: DC

## 2023-11-03 MED ORDER — LIDOCAINE VISCOUS HCL 2 % MT SOLN: OROMUCOSAL | 0 refills | Status: DC

## 2023-11-03 NOTE — Discharge Instructions (Signed)
 The COVID test and rapid strep test were negative.  A throat culture has been ordered.  You will be contacted if the pending test results are abnormal.  You will also have access to the results via MyChart. Take medication as prescribed. Increase fluids and allow for plenty of rest. Recommend follow-up with your PCP or cardiologist to determine which medications would be appropriate for pain. Warm salt water gargles 3-4 times daily as needed for throat pain or discomfort.  You may also use over-the-counter Chloraseptic throat spray or throat lozenges as needed. Symptoms should begin to improve after treatment.  If symptoms fail to improve, or begin to worsen, recommend follow-up with your primary care physician for further evaluation. Follow-up as needed.

## 2023-11-03 NOTE — ED Triage Notes (Signed)
 Patient c/o sore throat, ha and fever x 3 days.  Patient has not taken any otc cold meds.

## 2023-11-03 NOTE — ED Provider Notes (Signed)
 [facility]    CSN: [id number] Arrival date & time: 11/03/23  1304      History   Chief Complaint Chief Complaint  Patient presents with   Sore Throat    HPI Isaac Diaz is a 58 y.o. male.   The history is provided by the patient.   Patient presents with a several day history of sore throat, subjective fever, and headache.  He denies ear pain, ear drainage, cough, chest pain, abdominal pain, nausea, vomiting, diarrhea, or rash.  Patient denies any obvious close sick contacts.  So far, states he has not taken any medication for his symptoms.  Past Medical History:  Diagnosis Date   Allergy     Aortic stenosis    a. s/p AVR using an On-X 23 mm mechanical valve on 04/04/2021   Enlarged prostate    Heart murmur     Patient Active Problem List   Diagnosis Date Noted   URTI (acute upper respiratory infection) 03/12/2023   Acute bronchitis 02/27/2023   Diarrhea of presumed infectious origin 02/27/2023   Non-recurrent acute suppurative otitis media of left ear without spontaneous rupture of tympanic membrane 06/26/2022   Impacted cerumen of left ear 06/26/2022   Alcohol use 01/22/2022   Skin rash 01/02/2022   Elevated BP without diagnosis of hypertension 01/02/2022   S/P AVR (aortic valve replacement) 05/01/2021   Encounter for therapeutic drug monitoring 05/01/2021   Aortic stenosis, severe 04/04/2021   Severe aortic stenosis    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   Tobacco abuse 06/21/2016   Dyspnea 06/21/2016   Fatigue 06/21/2016   Aortic atherosclerosis 06/21/2016    Past Surgical History:  Procedure Laterality Date   AMPUTATION FINGER Right    fingertip right long   AORTIC VALVE REPLACEMENT N/A 04/04/2021   Procedure: AORTIC VALVE REPLACEMENT (AVR) USING ON-X VALVE SIZE ;  Surgeon: Kerrin Elspeth BROCKS, MD;  Location: [facility] OR;  Service: Open Heart Surgery;  Laterality: N/A;  mechanical   RIGHT HEART CATH AND CORONARY  ANGIOGRAPHY N/A 03/16/2021   Procedure: RIGHT HEART CATH AND CORONARY ANGIOGRAPHY;  Surgeon: Verlin Lonni BIRCH, MD;  Location: [facility];  Service: Cardiovascular;  Laterality: N/A;   TEE WITHOUT CARDIOVERSION N/A 04/04/2021   Procedure: TRANSESOPHAGEAL ECHOCARDIOGRAM (TEE);  Surgeon: Kerrin Elspeth BROCKS, MD;  Location: [facility] OR;  Service: Open Heart Surgery;  Laterality: N/A;   WISDOM TOOTH EXTRACTION         Home Medications    Prior to Admission medications   Medication Sig Start Date End Date Taking? Authorizing Provider  albuterol  (VENTOLIN  HFA) 108 (90 Base) MCG/ACT inhaler INHALE 2 PUFFS INTO THE LUNGS EVERY 6 HOURS AS NEEDED FOR WHEEZING OR SHORTNESS OF BREATH 04/18/23  Yes Zarwolo, Gloria, FNP  amoxicillin -clavulanate (AUGMENTIN ) 875-125 MG tablet Take 1 tablet by mouth every 12 (twelve) hours. 11/03/23  Yes Leath-Warren, Etta PARAS, NP  clobetasol  cream (TEMOVATE ) 0.05 % Apply twice daily until clear. Avoid applying to face, groin, and axilla. 04/26/22  Yes Zarwolo, Gloria, FNP  fluticasone  (FLONASE ) 50 MCG/ACT nasal spray SHAKE LIQUID AND USE 2 SPRAYS IN EACH NOSTRIL DAILY 06/13/23  Yes Zarwolo, Gloria, FNP  levocetirizine (XYZAL ) 5 MG tablet Take 1 tablet (5 mg total) by mouth every evening. 03/12/23  Yes Tobie Suzzane POUR, MD  lidocaine  (XYLOCAINE ) 2 % solution Gargle and spit 5mL every 6 hrs as needed for throat pain or discomfort. 11/03/23  Yes Leath-Warren, Etta PARAS, NP  rosuvastatin  (CRESTOR ) 10 MG  tablet Take 1 tablet (10 mg total) by mouth daily. 02/17/23  Yes BranchDorn FALCON, MD  triamcinolone  cream (KENALOG ) 0.1 % Apply 1 Application topically 2 (two) times daily. 01/18/22  Yes Zarwolo, Gloria, FNP  Vitamin D , Ergocalciferol , (DRISDOL ) 1.25 MG (50000 UNIT) CAPS capsule TAKE 1 CAPSULE BY MOUTH EVERY 7 DAYS 04/14/23  Yes Zarwolo, Gloria, FNP  warfarin (COUMADIN ) 5 MG tablet TAKE 1 AND 1/2-2 TABLETS DAILY AS DIRECTED BY COUMADIN  CLINIC 02/17/23  Yes Branch, Dorn FALCON, MD     Family History Family History  Problem Relation Age of Onset   Cancer Mother        stomach   Stroke Father 76   Aneurysm Sister        brain aneurysm   Cancer Maternal Grandfather        lung    Social History Social History   Tobacco Use   Smoking status: Every Day    Current packs/day: 0.50    Average packs/day: 0.5 packs/day for 35.7 years (17.9 ttl pk-yrs)    Types: Cigarettes    Start date: 02/12/1988   Smokeless tobacco: Never  Vaping Use   Vaping status: Never Used  Substance Use Topics   Alcohol use: Yes    Alcohol/week: 14.0 standard drinks of alcohol    Types: 14 Cans of beer per week    Comment:  weekly   Drug use: Yes    Types: Marijuana    Comment: rare     Allergies   Other and Chocolate   Review of Systems Review of Systems Per HPI  Physical Exam Triage Vital Signs ED Triage Vitals  Encounter Vitals Group     BP 11/03/23 1519 (!) 158/101     Girls Systolic BP Percentile --      Girls Diastolic BP Percentile --      Boys Systolic BP Percentile --      Boys Diastolic BP Percentile --      Pulse Rate 11/03/23 1519 89     Resp 11/03/23 1519 18     Temp 11/03/23 1519 98.7 F (37.1 C)     Temp Source 11/03/23 1519 Oral     SpO2 11/03/23 1519 98 %     Weight 11/03/23 1521 178 lb (80.7 kg)     Height 11/03/23 1521 6' 2 (1.88 m)     Head Circumference --      Peak Flow --      Pain Score 11/03/23 1521 8     Pain Loc --      Pain Education --      Exclude from Growth Chart --    No data found.  Updated Vital Signs BP (!) 158/101 (BP Location: Right Arm)   Pulse 89   Temp 98.7 F (37.1 C) (Oral)   Resp 18   Ht 6' 2 (1.88 m)   Wt 178 lb (80.7 kg)   SpO2 98%   BMI 22.85 kg/m   Visual Acuity Right Eye Distance:   Left Eye Distance:   Bilateral Distance:    Right Eye Near:   Left Eye Near:    Bilateral Near:     Physical Exam Vitals and nursing note reviewed.  Constitutional:      General: He is not in acute  distress.    Appearance: Normal appearance.  HENT:     Head: Normocephalic.     Right Ear: Tympanic membrane, ear canal and external ear normal.     Left  Ear: Tympanic membrane, ear canal and external ear normal.     Nose: Congestion present.     Mouth/Throat:     Lips: Pink.     Mouth: Mucous membranes are moist.     Pharynx: Pharyngeal swelling, posterior oropharyngeal erythema and uvula swelling present. No oropharyngeal exudate.  Eyes:     Extraocular Movements: Extraocular movements intact.     Conjunctiva/sclera: Conjunctivae normal.     Pupils: Pupils are equal, round, and reactive to light.  Cardiovascular:     Rate and Rhythm: Normal rate and regular rhythm.     Pulses: Normal pulses.     Heart sounds: Normal heart sounds.  Pulmonary:     Effort: Pulmonary effort is normal. No respiratory distress.     Breath sounds: Normal breath sounds. No stridor. No wheezing, rhonchi or rales.  Abdominal:     General: Bowel sounds are normal.     Palpations: Abdomen is soft.     Tenderness: There is no abdominal tenderness.  Musculoskeletal:     Cervical back: Normal range of motion.  Lymphadenopathy:     Cervical: No cervical adenopathy.  Skin:    General: Skin is warm and dry.  Neurological:     General: No focal deficit present.     Mental Status: He is alert and oriented to person, place, and time.  Psychiatric:        Mood and Affect: Mood normal.        Behavior: Behavior normal.      UC Treatments / Results  Labs (all labs ordered are listed, but only abnormal results are displayed) Labs Reviewed  POC SOFIA SARS ANTIGEN FIA  POCT RAPID STREP A (OFFICE)    EKG   Radiology No results found.  Procedures Procedures (including critical care time)  Medications Ordered in UC Medications - No data to display  Initial Impression / Assessment and Plan / UC Course  I have reviewed the triage vital signs and the nursing notes.  Pertinent labs & imaging results  that were available during my care of the patient were reviewed by me and considered in my medical decision making (see chart for details).  The COVID and rapid strep test were negative.  A throat culture is pending.  On exam, the patient is well-appearing, he is in no acute distress, room air sats at 98%.  He is hypertensive however.  On exam, patient has uvula swelling and moderate erythema and irritation of his throat.  Will treat for acute tonsillitis with Augmentin  875/125 mg tablets as patient has had symptoms for almost 1 week.  Viscous lidocaine  2% provided for patient to gargle and spit for throat pain or discomfort.  Supportive care recommendations were provided discussed with the patient to include fluids, rest, and use of aspirin  as needed for pain or discomfort.  Also recommended the use of Chloraseptic throat spray or throat lozenges for pain or discomfort.  Patient was advised to follow-up with his PCP if symptoms fail to improve.  Patient was in agreement with this plan of care and verbalizes understanding.  All questions were answered.  Patient stable for discharge.   Final Clinical Impressions(s) / UC Diagnoses   Final diagnoses:  Sore throat  Acute tonsillitis, unspecified etiology     Discharge Instructions      The COVID test and rapid strep test were negative.  A throat culture has been ordered.  You will be contacted if the pending test results are abnormal.  You will also have access to the results via MyChart. Take medication as prescribed. Increase fluids and allow for plenty of rest. Recommend follow-up with your PCP or cardiologist to determine which medications would be appropriate for pain. Warm salt water gargles 3-4 times daily as needed for throat pain or discomfort.  You may also use over-the-counter Chloraseptic throat spray or throat lozenges as needed. Symptoms should begin to improve after treatment.  If symptoms fail to improve, or begin to worsen,  recommend follow-up with your primary care physician for further evaluation. Follow-up as needed.      ED Prescriptions     Medication Sig Dispense Auth. Provider   amoxicillin -clavulanate (AUGMENTIN ) 875-125 MG tablet Take 1 tablet by mouth every 12 (twelve) hours. 14 tablet Leath-Warren, Etta PARAS, NP   lidocaine  (XYLOCAINE ) 2 % solution Gargle and spit 5mL every 6 hrs as needed for throat pain or discomfort. 100 mL Leath-Warren, Etta PARAS, NP      PDMP not reviewed this encounter.   Gilmer Etta PARAS, NP 11/03/23 1553

## 2023-11-06 ENCOUNTER — Ambulatory Visit (HOSPITAL_COMMUNITY): Payer: Self-pay

## 2023-11-06 LAB — CULTURE, GROUP A STREP (THRC)

## 2023-12-04 ENCOUNTER — Other Ambulatory Visit: Payer: Self-pay | Admitting: Cardiology

## 2023-12-04 DIAGNOSIS — Z952 Presence of prosthetic heart valve: Secondary | ICD-10-CM

## 2023-12-05 NOTE — Telephone Encounter (Signed)
 Refill Request.

## 2023-12-05 NOTE — Telephone Encounter (Signed)
 Warfarin 5mg  refill S/P AVR (aortic valve replacement)  Last INR 02/17/23-need appt Last OV 12/04/22-need appt  Called patient and had to leave a voicemail to call back to schedule an appointment

## 2023-12-15 ENCOUNTER — Ambulatory Visit: Payer: Self-pay

## 2023-12-15 ENCOUNTER — Other Ambulatory Visit: Payer: Self-pay | Admitting: Family Medicine

## 2023-12-15 DIAGNOSIS — E7849 Other hyperlipidemia: Secondary | ICD-10-CM

## 2023-12-15 MED ORDER — ROSUVASTATIN CALCIUM 10 MG PO TABS: 10.0000 mg | ORAL_TABLET | Freq: Every day | ORAL | 0 refills | Status: AC

## 2023-12-15 NOTE — Telephone Encounter (Signed)
 Copied from CRM [id number]. Topic: Clinical - Medication Refill >> Dec 15, 2023  9:18 AM Rea C wrote: Medication: rosuvastatin  (CRESTOR ) 10 MG tablet  Has the patient contacted their pharmacy? No (Agent: If no, request that the patient contact the pharmacy for the refill. If patient does not wish to contact the pharmacy document the reason why and proceed with request.) (Agent: If yes, when and what did the pharmacy advise?)  This is the patient's preferred pharmacy:  [id number] - [place], [place] - [address] AT SEC OF [address] [place] [address] Phone: [phone] Fax: [phone]  Is this the correct pharmacy for this prescription? Yes If no, delete pharmacy and type the correct one.   Has the prescription been filled recently? No  Is the patient out of the medication? Yes for the past two weeks going on three  Has the patient been seen for an appointment in the last year OR does the patient have an upcoming appointment? Yes  Can we respond through MyChart? Yes  Agent: Please be advised that Rx refills may take up to 3 business days. We ask that you follow-up with your pharmacy.

## 2023-12-15 NOTE — Telephone Encounter (Signed)
 FYI Only or Action Required?: Action required by provider: medication refill request. Warfarin 5mg  Pt is requesting a refill of this medication. Advised that Dr. Alvan is the prescriber. Pt states that Meade has refilled in the past and would like her to prescribe now. Pt is working 12 hour days and has not been able to go to provider for appt.  Patient was last seen in primary care on 06/09/2023 by Edman Meade PEDLAR, FNP.  Called Nurse Triage reporting Medication Refill.  Symptoms began several weeks ago.  Interventions attempted: Nothing.  Symptoms are: stable.  Triage Disposition: Call PCP Now  Patient/caregiver understands and will follow disposition?: Yes                         Summary: warfarin (COUMADIN ) 5 MG tablet Refill   Reason for Triage: Warfarin 5mg  refill  Patient has been out for the past two weeks.   [phone] (H)         Reason for Disposition  [1] Prescription refill request for ESSENTIAL medicine (i.e., likelihood of harm to patient if not taken) AND [2] triager unable to refill per department policy  Answer Assessment - Initial Assessment Questions 1. DRUG NAME: What medicine do you need to have refilled?     Warfarin 5mg  2. REFILLS REMAINING: How many refills are remaining? Notes: The label on the medicine or pill bottle will show how many refills are remaining. If there are no refills remaining, then a renewal may be needed.     None - pt has been out for 32 4. PRESCRIBER: Who prescribed it? Note: The prescribing doctor or group is responsible for refill approvals..     Dr. Alvan  6. SYMPTOMS: Do you have any symptoms?     no  Protocols used: Medication Refill and Renewal Call-A-AH

## 2023-12-16 ENCOUNTER — Other Ambulatory Visit: Payer: Self-pay | Admitting: Family Medicine

## 2023-12-16 DIAGNOSIS — Z952 Presence of prosthetic heart valve: Secondary | ICD-10-CM

## 2023-12-16 NOTE — Telephone Encounter (Signed)
 Copied from CRM [id number]. Topic: Clinical - Medication Refill >> Dec 16, 2023 11:06 AM Gustabo D wrote: Medication: warfarin (COUMADIN ) 5 MG tablet  Has the patient contacted their pharmacy? Yes (Agent: If no, request that the patient contact the pharmacy for the refill. If patient does not wish to contact the pharmacy document the reason why and proceed with request.) (Agent: If yes, when and what did the pharmacy advise?)  This is the patient's preferred pharmacy:  [id number] - [place], [place] - [address] [place] [address] Phone: [phone] Fax: [phone]  Is this the correct pharmacy for this prescription? Yes If no, delete pharmacy and type the correct one.   Has the prescription been filled recently? Yes  Is the patient out of the medication? Yes out for 3 weeks  Has the patient been seen for an appointment in the last year OR does the patient have an upcoming appointment? Yes  Can we respond through MyChart? No  Agent: Please be advised that Rx refills may take up to 3 business days. We ask that you follow-up with your pharmacy.

## 2023-12-18 ENCOUNTER — Encounter (INDEPENDENT_AMBULATORY_CARE_PROVIDER_SITE_OTHER): Payer: Self-pay | Admitting: Gastroenterology

## 2023-12-23 ENCOUNTER — Other Ambulatory Visit: Payer: Self-pay | Admitting: Cardiology

## 2023-12-23 DIAGNOSIS — Z952 Presence of prosthetic heart valve: Secondary | ICD-10-CM

## 2023-12-23 NOTE — Telephone Encounter (Signed)
*  STAT* If patient is at the pharmacy, call can be transferred to refill team.   1. Which medications need to be refilled? (please list name of each medication and dose if known) warfarin (COUMADIN ) 5 MG tablet    4. Which pharmacy/location (including street and city if local pharmacy) is medication to be sent to?  [id number] - [place], [place] - [address] AT [address]     5. Do they need a 30 day or 90 day supply? 90    Sch 2/18

## 2023-12-25 ENCOUNTER — Other Ambulatory Visit: Payer: Self-pay | Admitting: Cardiology

## 2023-12-25 DIAGNOSIS — Z952 Presence of prosthetic heart valve: Secondary | ICD-10-CM

## 2024-01-01 ENCOUNTER — Encounter: Payer: Self-pay | Admitting: *Deleted

## 2024-01-01 ENCOUNTER — Telehealth: Payer: Self-pay | Admitting: *Deleted

## 2024-01-01 NOTE — Telephone Encounter (Signed)
 Called patient regarding an appointment for his coumadin  check. He is past due. No answer. Mailed a letter to patient asking that he contact our office.

## 2024-01-22 ENCOUNTER — Telehealth: Payer: Self-pay | Admitting: Cardiology

## 2024-01-22 DIAGNOSIS — Z952 Presence of prosthetic heart valve: Secondary | ICD-10-CM

## 2024-01-22 NOTE — Telephone Encounter (Signed)
 Pt scheduled 03/31/24

## 2024-01-27 ENCOUNTER — Ambulatory Visit: Attending: Internal Medicine

## 2024-01-27 DIAGNOSIS — Z952 Presence of prosthetic heart valve: Secondary | ICD-10-CM

## 2024-01-27 DIAGNOSIS — Z5181 Encounter for therapeutic drug level monitoring: Secondary | ICD-10-CM

## 2024-01-27 LAB — POCT INR: INR: 1.2 — AB (ref 2.0–3.0)

## 2024-01-27 MED ORDER — WARFARIN SODIUM 5 MG PO TABS: ORAL_TABLET | ORAL | 1 refills | Status: AC

## 2024-01-27 NOTE — Progress Notes (Signed)
 INR 1.2; Please see anticoagulation encounter

## 2024-01-27 NOTE — Patient Instructions (Signed)
 Has been out of warfarin since before Thanksgiving. Restart warfarin 1 1/2 tablets daily Recheck in 2 wks

## 2024-02-09 ENCOUNTER — Ambulatory Visit: Attending: Cardiology | Admitting: *Deleted

## 2024-02-09 DIAGNOSIS — Z952 Presence of prosthetic heart valve: Secondary | ICD-10-CM | POA: Diagnosis not present

## 2024-02-09 DIAGNOSIS — Z5181 Encounter for therapeutic drug level monitoring: Secondary | ICD-10-CM

## 2024-02-09 LAB — POCT INR: INR: 1.2 — AB (ref 2.0–3.0)

## 2024-02-09 NOTE — Progress Notes (Signed)
 INR 1.2; Please see anticoagulation encounter

## 2024-02-09 NOTE — Patient Instructions (Signed)
 Take warfarin 2 tablets tonight then increase dose to 1 1/2 tablets daily except 2 tablets on Tuesdays and Fridays Recheck in 2 wks

## 2024-02-23 ENCOUNTER — Ambulatory Visit: Attending: Cardiology | Admitting: *Deleted

## 2024-02-23 DIAGNOSIS — Z5181 Encounter for therapeutic drug level monitoring: Secondary | ICD-10-CM

## 2024-02-23 DIAGNOSIS — Z952 Presence of prosthetic heart valve: Secondary | ICD-10-CM | POA: Diagnosis not present

## 2024-02-23 LAB — POCT INR: INR: 1.8 — AB (ref 2.0–3.0)

## 2024-02-23 NOTE — Patient Instructions (Signed)
 Continue warfarin 1 1/2 tablets daily except 2 tablets on Tuesdays and Fridays Recheck in 3 wks

## 2024-02-23 NOTE — Progress Notes (Signed)
 INR 1.8. Please see anticoagulation encounter

## 2024-03-09 ENCOUNTER — Ambulatory Visit: Attending: Student | Admitting: Student

## 2024-03-09 ENCOUNTER — Encounter: Payer: Self-pay | Admitting: Student

## 2024-03-09 VITALS — BP 142/80 | HR 67 | Ht 74.0 in | Wt 180.4 lb

## 2024-03-09 DIAGNOSIS — E782 Mixed hyperlipidemia: Secondary | ICD-10-CM | POA: Diagnosis not present

## 2024-03-09 DIAGNOSIS — R030 Elevated blood-pressure reading, without diagnosis of hypertension: Secondary | ICD-10-CM | POA: Diagnosis not present

## 2024-03-09 DIAGNOSIS — Z952 Presence of prosthetic heart valve: Secondary | ICD-10-CM

## 2024-03-09 NOTE — Patient Instructions (Signed)
 Medication Instructions:  Your physician recommends that you continue on your current medications as directed. Please refer to the Current Medication list given to you today.  Monitor Blood Pressure and record readings for one month.   *If you need a refill on your cardiac medications before your next appointment, please call your pharmacy*  Lab Work: Your physician recommends that you return for lab work in: Fasting   If you have labs (blood work) drawn today and your tests are completely normal, you will receive your results only by: MyChart Message (if you have MyChart) OR A paper copy in the mail If you have any lab test that is abnormal or we need to change your treatment, we will call you to review the results.  Testing/Procedures: NONE    Follow-Up: At [facility], you and your health needs are our priority.  As part of our continuing mission to provide you with exceptional heart care, our providers are all part of one team.  This team includes your primary Cardiologist (physician) and Advanced Practice Providers or APPs (Physician Assistants and Nurse Practitioners) who all work together to provide you with the care you need, when you need it.  Your next appointment:   1 year(s)  Provider:   You may see Alvan Carrier, MD or one of the following Advanced Practice Providers on your designated Care Team:   Laymon Qua, PA-C  [place], [place] Olivia [place], [place]     We recommend signing up for the patient portal called MyChart.  Sign up information is provided on this After Visit Summary.  MyChart is used to connect with patients for Virtual Visits (Telemedicine).  Patients are able to view lab/test results, encounter notes, upcoming appointments, etc.  Non-urgent messages can be sent to your provider as well.   To learn more about what you can do with MyChart, go to [email or web address].   Other Instructions Thank you for choosing [facility]!

## 2024-03-09 NOTE — Progress Notes (Addendum)
 "  Cardiology Office Note    Date:  03/09/2024  ID:  Isaac Diaz, DOB 1965/11/06, MRN [id number] Cardiologist: Alvan Carrier, MD { : History of Present Illness:    Isaac Diaz is a 59 y.o. male with past medical history of bicuspid aortic valve/aortic stenosis (s/p AVR with On-X 23 mm mechanical valve in 03/2021), post-operative atrial fibrillation (occurring following AVR in 03/2021), normal cors by cath in 03/2021 and seasonal allergies who presents to the office today for annual follow-up.  He was last examined by Dr. Alvan in 11/2022 and denied any recent chest pain or palpitations at that time. Blood pressure was elevated but had previously been well-controlled, therefore was encouraged to come back for a BP check. He was continued on ASA 81 mg daily and Coumadin  for anticoagulation. He is followed by our Coumadin  clinic and INR was at 1.8 when checked on 02/23/2024 (INR goal 1.5-2.0).  In talking with the patient and his sister today, he reports overall doing well since his last office visit. He does report occasional episodes of a sharp chest pain which lasts for a few seconds and spontaneously resolves. 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 Denies any exertional chest pain or dyspnea on exertion with this. No recent palpitations, orthopnea or PND. Does notice occasional lower extremity edema at the end of the day. Remains on Coumadin  for anticoagulation with no reports of active bleeding. His blood pressure is elevated today and he does not check this routinely at home.  Studies Reviewed:   EKG: EKG is ordered today and demonstrates:   EKG Interpretation Date/Time:  Tuesday March 09 2024 15:54:53 EST Ventricular Rate:  67 PR Interval:  102 QRS Duration:  122 QT Interval:  422 QTC Calculation: 445 R Axis:   76  Text Interpretation: Sinus rhythm with short PR Minimal voltage criteria for LVH, may be  normal variant ( Sokolow-Lyon ) Confirmed by [name] ([id number]) on 03/09/2024 3:57:35 PM       Cardiac Catheterization: 03/2021 No angiographic evidence of CAD Normal right heart pressures Severe aortic stenosis by echo. I did not cross the aortic valve today.    Recommendations: Continue planning for AVR. He is likely going to be best treated with surgical AVR. I will make a referral to CT surgery to discuss this.   Echocardiogram: 05/2021 IMPRESSIONS     1. Left ventricular ejection fraction, by estimation, is 60 to 65%. The  left ventricle has normal function. The left ventricle has no regional  wall motion abnormalities. The left ventricular internal cavity size was  mildly dilated. There is mild left  ventricular hypertrophy. Left ventricular diastolic parameters were  normal.   2. Right ventricular systolic function is normal. The right ventricular  size is normal.   3. Right atrial size was mildly dilated.   4. Mild mitral valve regurgitation.   5. S/p AVR (23 mm ON-X valve 04/04/21) Valve is difficult to see. Peak and  mean gradients through the valve are 30 and 16 mm Hg. Overall normal  prosthetic function . The aortic valve has been repaired/replaced. Aortic  valve regurgitation is trivial.   6. The inferior vena cava is normal in size with greater than 50%  respiratory variability, suggesting right atrial pressure of 3 mmHg.   Risk Assessment/Calculations:   HYPERTENSION CONTROL Vitals:   03/09/24 1547 03/09/24 1616  BP: (!) 149/81 (!) 142/80  The patient's blood pressure is elevated above target today. In order to address the patient's elevated BP: Blood pressure will be monitored at home to determine if medication changes need to be made.    Physical Exam:   VS:  BP (!) 142/80   Pulse 67   Ht 6' 2 (1.88 m)   Wt 180 lb 6.4 oz (81.8 kg)   SpO2 100%   BMI 23.16 kg/m    Wt Readings from Last 3 Encounters:  03/09/24 180 lb 6.4 oz (81.8 kg)   11/03/23 178 lb (80.7 kg)  06/09/23 173 lb 1.9 oz (78.5 kg)     GEN: Pleasant male appearing in no acute distress NECK: No JVD; No carotid bruits CARDIAC: RRR, crisp mechanical valve sounds present.  RESPIRATORY:  Clear to auscultation without rales, wheezing or rhonchi  ABDOMEN: Appears non-distended. No obvious abdominal masses. EXTREMITIES: No clubbing or cyanosis. No pitting edema.  Distal pedal pulses are 2+ bilaterally.   Assessment and Plan:   1. S/P AVR (aortic valve replacement) - He previously underwent AVR with an On-X mechanical valve in 03/2021. Echocardiogram in 05/2021 showed his mechanical valve was functioning normally with peak and mean gradients at 30 and 16 mmHg respectively. Can consider follow-up imaging in 2028 unless new symptoms in the interim as he would be 5 years out from surgery.  - INR was at 1.8 when checked earlier this month. The most recent CBC on file is from 02/2023 and this showed his hemoglobin was at 12.9 and platelets at 178 K. Will recheck a CBC with upcoming labs.   2. Mixed hyperlipidemia - LDL was previously at 117 when checked in 06/2022 and he was started on Crestor  at that time. I am unable to locate a repeat FLP in the interim. Will recheck an FLP and CMET with upcoming labs.   3. Elevated BP without Diagnosis of HTN - His blood pressure was initially recorded at 149/81, rechecked and at 142/80. He does not check this in the ambulatory setting.  We reviewed options in regards to reducing sodium intake in his diet and checking blood pressure at home or adding an antihypertensive medication and he prefers to avoid medical therapy if able. Therefore, he was provided with a BP log and encouraged to return this in 1 month. If BP remains above goal, would plan to add Losartan or Amlodipine . Would not use a beta-blocker as he previously had bradycardia with Toprol -XL in the past.  Signed, Laymon CHRISTELLA Qua, PA-C   "

## 2024-03-10 ENCOUNTER — Telehealth: Payer: Self-pay | Admitting: Student

## 2024-03-10 NOTE — Telephone Encounter (Signed)
" ° °  Based off the patient's appointment yesterday, he is due for routine labs and plans to have these next week. I did send a message to Olam Bathe with the Coumadin  Clinic to see if we can check an INR at that time and forward results to her so he does not have to travel from [place] to [place] given his work schedule. She was fine with that. Tried calling the patient with this information but his phone is not working. Left a voicemail on his sister's phone with the above information and asked her to call back if any questions.  Signed, Laymon CHRISTELLA Qua, PA-C 03/10/2024, 10:52 AM   "

## 2024-03-15 ENCOUNTER — Ambulatory Visit

## 2024-03-22 ENCOUNTER — Ambulatory Visit

## 2024-03-31 ENCOUNTER — Ambulatory Visit: Admitting: Cardiology
